# Patient Record
Sex: Male | Born: 1985 | Race: Black or African American | Hispanic: No | Marital: Single | Smoking: Current every day smoker
Health system: Southern US, Community
[De-identification: ages and names within clinical notes are randomized; demographics above are authoritative.]

## PROBLEM LIST (undated history)

## (undated) DIAGNOSIS — F319 Bipolar disorder, unspecified: Secondary | ICD-10-CM

## (undated) DIAGNOSIS — T7840XA Allergy, unspecified, initial encounter: Secondary | ICD-10-CM

## (undated) HISTORY — PX: TONSILLECTOMY: SUR1361

## (undated) HISTORY — DX: Allergy, unspecified, initial encounter: T78.40XA

---
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
## 2006-03-02 ENCOUNTER — Ambulatory Visit: Payer: Self-pay | Admitting: Family Medicine

## 2006-03-05 ENCOUNTER — Ambulatory Visit: Payer: Self-pay | Admitting: Internal Medicine

## 2006-04-14 ENCOUNTER — Ambulatory Visit: Payer: Self-pay | Admitting: Family Medicine

## 2006-05-27 ENCOUNTER — Ambulatory Visit: Payer: Self-pay | Admitting: Family Medicine

## 2007-12-19 ENCOUNTER — Emergency Department (HOSPITAL_COMMUNITY): Admission: EM | Admit: 2007-12-19 | Discharge: 2007-12-19 | Payer: Self-pay | Admitting: Family Medicine

## 2008-02-01 ENCOUNTER — Ambulatory Visit: Payer: Self-pay | Admitting: Family Medicine

## 2008-02-01 DIAGNOSIS — J45909 Unspecified asthma, uncomplicated: Secondary | ICD-10-CM | POA: Insufficient documentation

## 2008-02-01 DIAGNOSIS — J309 Allergic rhinitis, unspecified: Secondary | ICD-10-CM | POA: Insufficient documentation

## 2008-02-01 DIAGNOSIS — Z9189 Other specified personal risk factors, not elsewhere classified: Secondary | ICD-10-CM | POA: Insufficient documentation

## 2008-02-02 ENCOUNTER — Telehealth: Payer: Self-pay | Admitting: Family Medicine

## 2008-02-03 LAB — CONVERTED CEMR LAB
ALT: 31 units/L (ref 0–53)
AST: 45 units/L — ABNORMAL HIGH (ref 0–37)
Alkaline Phosphatase: 74 units/L (ref 39–117)
Basophils Relative: 0.2 % (ref 0.0–1.0)
Bilirubin, Direct: 0.3 mg/dL (ref 0.0–0.3)
CO2: 30 meq/L (ref 19–32)
Calcium: 9.1 mg/dL (ref 8.4–10.5)
Chloride: 97 meq/L (ref 96–112)
Eosinophils Relative: 5.2 % — ABNORMAL HIGH (ref 0.0–5.0)
Glucose, Bld: 84 mg/dL (ref 70–99)
Platelets: 373 10*3/uL (ref 150–400)
RBC: 5.22 M/uL (ref 4.22–5.81)
RDW: 12.3 % (ref 11.5–14.6)
Total Protein: 6.9 g/dL (ref 6.0–8.3)
WBC: 11.4 10*3/uL — ABNORMAL HIGH (ref 4.5–10.5)

## 2008-03-20 ENCOUNTER — Ambulatory Visit: Payer: Self-pay | Admitting: Internal Medicine

## 2008-03-20 DIAGNOSIS — R0989 Other specified symptoms and signs involving the circulatory and respiratory systems: Secondary | ICD-10-CM

## 2008-03-20 DIAGNOSIS — R0609 Other forms of dyspnea: Secondary | ICD-10-CM

## 2008-03-21 ENCOUNTER — Telehealth: Payer: Self-pay | Admitting: Pulmonary Disease

## 2008-03-22 ENCOUNTER — Ambulatory Visit (HOSPITAL_COMMUNITY): Admission: RE | Admit: 2008-03-22 | Discharge: 2008-03-22 | Payer: Self-pay | Admitting: Internal Medicine

## 2008-03-22 ENCOUNTER — Ambulatory Visit: Payer: Self-pay | Admitting: Cardiology

## 2008-03-22 ENCOUNTER — Encounter (INDEPENDENT_AMBULATORY_CARE_PROVIDER_SITE_OTHER): Payer: Self-pay | Admitting: Interventional Radiology

## 2008-03-22 ENCOUNTER — Ambulatory Visit: Payer: Self-pay | Admitting: Pulmonary Disease

## 2008-03-22 ENCOUNTER — Encounter: Payer: Self-pay | Admitting: Internal Medicine

## 2008-03-22 DIAGNOSIS — J90 Pleural effusion, not elsewhere classified: Secondary | ICD-10-CM | POA: Insufficient documentation

## 2008-03-23 ENCOUNTER — Telehealth (INDEPENDENT_AMBULATORY_CARE_PROVIDER_SITE_OTHER): Payer: Self-pay | Admitting: *Deleted

## 2008-03-27 ENCOUNTER — Ambulatory Visit: Payer: Self-pay | Admitting: Thoracic Surgery

## 2008-04-03 ENCOUNTER — Ambulatory Visit: Payer: Self-pay | Admitting: Thoracic Surgery

## 2008-04-03 ENCOUNTER — Encounter: Payer: Self-pay | Admitting: Thoracic Surgery

## 2008-04-03 ENCOUNTER — Encounter: Payer: Self-pay | Admitting: Internal Medicine

## 2008-04-03 ENCOUNTER — Ambulatory Visit: Payer: Self-pay | Admitting: Internal Medicine

## 2008-04-03 ENCOUNTER — Inpatient Hospital Stay (HOSPITAL_COMMUNITY): Admission: RE | Admit: 2008-04-03 | Discharge: 2008-04-10 | Payer: Self-pay | Admitting: Thoracic Surgery

## 2008-04-19 ENCOUNTER — Encounter: Admission: RE | Admit: 2008-04-19 | Discharge: 2008-04-19 | Payer: Self-pay | Admitting: Thoracic Surgery

## 2008-04-19 ENCOUNTER — Ambulatory Visit: Payer: Self-pay | Admitting: Thoracic Surgery

## 2008-05-22 ENCOUNTER — Ambulatory Visit: Payer: Self-pay | Admitting: Thoracic Surgery

## 2008-05-22 ENCOUNTER — Encounter: Admission: RE | Admit: 2008-05-22 | Discharge: 2008-05-22 | Payer: Self-pay | Admitting: Thoracic Surgery

## 2008-10-02 ENCOUNTER — Ambulatory Visit: Payer: Self-pay | Admitting: Family Medicine

## 2008-10-02 DIAGNOSIS — A158 Other respiratory tuberculosis: Secondary | ICD-10-CM | POA: Insufficient documentation

## 2008-10-02 DIAGNOSIS — J069 Acute upper respiratory infection, unspecified: Secondary | ICD-10-CM | POA: Insufficient documentation

## 2009-04-06 ENCOUNTER — Emergency Department (HOSPITAL_COMMUNITY): Admission: EM | Admit: 2009-04-06 | Discharge: 2009-04-06 | Payer: Self-pay | Admitting: Emergency Medicine

## 2010-03-14 ENCOUNTER — Telehealth: Payer: Self-pay

## 2010-05-16 ENCOUNTER — Ambulatory Visit: Payer: Self-pay | Admitting: Family Medicine

## 2010-05-16 DIAGNOSIS — R599 Enlarged lymph nodes, unspecified: Secondary | ICD-10-CM | POA: Insufficient documentation

## 2010-05-20 ENCOUNTER — Telehealth: Payer: Self-pay | Admitting: Family Medicine

## 2010-05-23 ENCOUNTER — Encounter: Payer: Self-pay | Admitting: Family Medicine

## 2010-05-23 ENCOUNTER — Telehealth: Payer: Self-pay | Admitting: Family Medicine

## 2010-05-30 ENCOUNTER — Telehealth: Payer: Self-pay | Admitting: Family Medicine

## 2010-06-06 ENCOUNTER — Telehealth: Payer: Self-pay | Admitting: Family Medicine

## 2010-10-20 ENCOUNTER — Ambulatory Visit: Payer: Self-pay | Admitting: Family Medicine

## 2010-10-20 DIAGNOSIS — L0231 Cutaneous abscess of buttock: Secondary | ICD-10-CM

## 2010-10-20 DIAGNOSIS — L03317 Cellulitis of buttock: Secondary | ICD-10-CM

## 2010-11-05 ENCOUNTER — Ambulatory Visit: Payer: Self-pay | Admitting: Family Medicine

## 2010-11-10 ENCOUNTER — Ambulatory Visit: Payer: Self-pay | Admitting: Family Medicine

## 2010-11-10 DIAGNOSIS — E119 Type 2 diabetes mellitus without complications: Secondary | ICD-10-CM

## 2010-11-10 LAB — CONVERTED CEMR LAB
ALT: 14 units/L (ref 0–53)
AST: 21 units/L (ref 0–37)
Albumin: 3.8 g/dL (ref 3.5–5.2)
Alkaline Phosphatase: 64 units/L (ref 39–117)
Basophils Relative: 0.5 % (ref 0.0–3.0)
Bilirubin Urine: NEGATIVE
Bilirubin, Direct: 0.1 mg/dL (ref 0.0–0.3)
CO2: 30 meq/L (ref 19–32)
Calcium: 9.3 mg/dL (ref 8.4–10.5)
Chloride: 103 meq/L (ref 96–112)
Eosinophils Absolute: 0.6 10*3/uL (ref 0.0–0.7)
Eosinophils Relative: 4.9 % (ref 0.0–5.0)
HDL: 37.6 mg/dL — ABNORMAL LOW (ref >39.00)
Hemoglobin, Urine: NEGATIVE
Hemoglobin: 12.4 g/dL — ABNORMAL LOW (ref 13.0–17.0)
Ketones, ur: NEGATIVE mg/dL
LDL Cholesterol: 86 mg/dL (ref 0–99)
Lymphocytes Relative: 32.5 % (ref 12.0–46.0)
MCHC: 33.7 g/dL (ref 30.0–36.0)
Neutro Abs: 6.6 10*3/uL (ref 1.4–7.7)
RBC: 4.49 M/uL (ref 4.22–5.81)
Sodium: 141 meq/L (ref 135–145)
Total CHOL/HDL Ratio: 3
Total Protein: 6.4 g/dL (ref 6.0–8.3)
Urine Glucose: NEGATIVE mg/dL
Urobilinogen, UA: 1 (ref 0.0–1.0)
WBC: 12.6 10*3/uL — ABNORMAL HIGH (ref 4.5–10.5)

## 2010-12-28 ENCOUNTER — Encounter: Payer: Self-pay | Admitting: Thoracic Surgery

## 2010-12-29 ENCOUNTER — Encounter: Payer: Self-pay | Admitting: Thoracic Surgery

## 2011-01-06 NOTE — Assessment & Plan Note (Signed)
Summary: cpx//ccm   Vital Signs:  Patient profile:   25 year old male Height:      71 inches Weight:      196 pounds BMI:     27.44 O2 Sat:      95 % Temp:     98.4 degrees F Pulse rate:   70 / minute BP sitting:   110 / 60  (left arm) Cuff size:   regular  Vitals Entered By: Pura Spice, RN (November 10, 2010 1:39 PM) CC: cpx    History of Present Illness: 25 yr old male for a cpx. He feels well and has no complaints. His recent labs showed the new onset of mild type 2 DM. He admits that he drinks a lot of sodas and eats primarily fast food every day.   Allergies: 1)  Percocet (Oxycodone-Acetaminophen) 2)  Cephalexin (Cephalexin)  Past History:  Past Medical History: Reviewed history from 10/02/2008 and no changes required. pulmonary tuberculosis, sees Dr. Edwyna Shell and Dr. Shelle Iron ALLERGIC RHINITIS (ICD-477.9) ASTHMA (ICD-493.90) as a child    Past Surgical History: Reviewed history from 02/01/2008 and no changes required. Tonsillectomy  Family History: Reviewed history from 03/22/2008 and no changes required. Family History of Asthma allergies: brother, mother, maternal grandmother  Social History: Reviewed history from 03/22/2008 and no changes required. Single Alcohol use-occasionally Drug use-former Patient states former smoker.  pt works at [employer] out.  Review of Systems  The patient denies anorexia, fever, weight loss, weight gain, vision loss, decreased hearing, hoarseness, chest pain, syncope, dyspnea on exertion, peripheral edema, prolonged cough, headaches, hemoptysis, abdominal pain, melena, hematochezia, severe indigestion/heartburn, hematuria, incontinence, genital sores, muscle weakness, suspicious skin lesions, transient blindness, difficulty walking, depression, unusual weight change, abnormal bleeding, enlarged lymph nodes, angioedema, breast masses, and testicular masses.         Flu Vaccine Consent Questions     Do you have a history of severe  allergic reactions to this vaccine? no    Any prior history of allergic reactions to egg and/or gelatin? no    Do you have a sensitivity to the preservative Thimersol? no    Do you have a past history of Guillan-Barre Syndrome? no    Do you currently have an acute febrile illness? no    Have you ever had a severe reaction to latex? no    Vaccine information given and explained to patient? yes    Are you currently pregnant? no    Lot Number:[id number]   Exp Date:06/06/2011   Site Given  Left Deltoid IM   Physical Exam  General:  Well-developed,well-nourished,in no acute distress; alert,appropriate and cooperative throughout examination Head:  Normocephalic and atraumatic without obvious abnormalities. No apparent alopecia or balding. Eyes:  No corneal or conjunctival inflammation noted. EOMI. Perrla. Funduscopic exam benign, without hemorrhages, exudates or papilledema. Vision grossly normal. Ears:  External ear exam shows no significant lesions or deformities.  Otoscopic examination reveals clear canals, tympanic membranes are intact bilaterally without bulging, retraction, inflammation or discharge. Hearing is grossly normal bilaterally. Nose:  External nasal examination shows no deformity or inflammation. Nasal mucosa are pink and moist without lesions or exudates. Mouth:  Oral mucosa and oropharynx without lesions or exudates.  Teeth in good repair. Neck:  No deformities, masses, or tenderness noted. Chest Wall:  No deformities, masses, tenderness or gynecomastia noted. Lungs:  Normal respiratory effort, chest expands symmetrically. Lungs are clear to auscultation, no crackles or wheezes. Heart:  Normal rate and regular rhythm.  S1 and S2 normal without gallop, murmur, click, rub or other extra sounds. Abdomen:  Bowel sounds positive,abdomen soft and non-tender without masses, organomegaly or hernias noted. Genitalia:  Testes bilaterally descended without nodularity, tenderness or masses.  No scrotal masses or lesions. No penis lesions or urethral discharge. Msk:  No deformity or scoliosis noted of thoracic or lumbar spine.   Pulses:  R and L carotid,radial,femoral,dorsalis pedis and posterior tibial pulses are full and equal bilaterally Extremities:  No clubbing, cyanosis, edema, or deformity noted with normal full range of motion of all joints.   Neurologic:  No cranial nerve deficits noted. Station and gait are normal. Plantar reflexes are down-going bilaterally. DTRs are symmetrical throughout. Sensory, motor and coordinative functions appear intact. Skin:  Intact without suspicious lesions or rashes Cervical Nodes:  No lymphadenopathy noted Axillary Nodes:  No palpable lymphadenopathy Inguinal Nodes:  No significant adenopathy Psych:  Cognition and judgment appear intact. Alert and cooperative with normal attention span and concentration. No apparent delusions, illusions, hallucinations   Impression & Recommendations:  Problem # 1:  DIABETES MELLITUS, TYPE II (ICD-250.00)  Other Orders: Admin 1st Vaccine ([address]) Flu Vaccine 43yrs + (60454)  Patient Instructions: 1)  We discussed dietary changes that need to be made, and he understands.  2)  Please schedule a follow-up appointment in 6 months .    Orders Added: 1)  Admin 1st Vaccine [90471] 2)  Flu Vaccine 20yrs + [90658] 3)  Est. Patient 18-39 years [99395]

## 2011-01-06 NOTE — Progress Notes (Signed)
Summary: Nausea and vomiting on Cephlaxin  Phone Note Call from Patient   Caller: Patient Call For: Dalton [name] Summary of Call: Pt here requesting off work note as he has been nauseated and vomiting on Cephalexin 500mg , one tab three times a day  X 10 days.  He hopes to RTW  today.  OV here on Friday, Throat feeling better [facility] May I do Off work note for pt Consider another antibiotic Initial call taken by: Sid Falcon LPN,  May 20, 2010 1:00 PM  Follow-up for Phone Call        Per verbal Dr Caryl Never, DC Antibiotic as pt is feeling better, throat nodule improved, F/U if symptoms return.  Pt does have F/U with Dr Clent Ridges in 2 weeks as scheduled.  Work note written Follow-up by: Sid Falcon LPN,  May 20, 2010 1:13 PM   New Allergies: CEPHALEXIN (CEPHALEXIN) New Allergies: CEPHALEXIN (CEPHALEXIN)

## 2011-01-06 NOTE — Letter (Signed)
Summary: Out of Work  Adult nurse at [employer]  [address]   [place], [place] [address]   Phone: [phone]  Fax: [phone]    May 23, 2010         Employee:  Dalton [name] [facility]    To Whom It May Concern:   For Medical reasons, please excuse the above named employee from work for the following dates:  Start:   05/16/2010  End:   05/23/2010.  [name] may return to work, regular duties on Monday, 6/20.  If you need additional information, please feel free to contact our office.         Sincerely,        Evelena Peat, MD

## 2011-01-06 NOTE — Progress Notes (Signed)
Summary: no show  Phone Note Other Incoming   Summary of Call: no show Initial call taken by: Raechel Ache, RN,  May 30, 2010 9:12 AM  Follow-up for Phone Call        charge the NS fee Follow-up by: Nelwyn Salisbury MD,  May 30, 2010 9:48 AM

## 2011-01-06 NOTE — Assessment & Plan Note (Signed)
Summary: sore throat/dm   Vital Signs:  Patient profile:   25 year old male Temp:     98.0 degrees F oral BP sitting:   120 / 70  (left arm) Cuff size:   regular  Vitals Entered By: Sid Falcon LPN (May 16, 2010 11:52 AM) CC: Left side of neck noted nodule   History of Present Illness: One week hx of L ant cervical adenopathy.  this is nonpainful.  Denies sore throat, nasal congestion, fever, chills, appetite change, weight change, rash, or any other adenopathy.  History of pulmonary tuberculosis treated 2 years ago. He denies any pleuritic pain or hemoptysis. Today history of occasional dry cough. Patient was treated for a full 9 months with rifampin and isoniazid.  does smoke less than one half packs a day.  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 Shelle Iron ALLERGIC RHINITIS (ICD-477.9) ASTHMA (ICD-493.90) as a child    Past Surgical History: Last updated: 02/01/2008 Tonsillectomy  Social History: Last updated: 03/22/2008 Single Alcohol use-occasionally Drug use-former Patient states former smoker.  pt works at [employer] out. PMH reviewed for relevance, SH/Risk Factors reviewed for relevance  Review of Systems       The patient complains of enlarged lymph nodes.  The patient denies anorexia, fever, weight loss, chest pain, syncope, dyspnea on exertion, peripheral edema, prolonged cough, headaches, hemoptysis, abdominal pain, and unusual weight change.    Physical Exam  General:  Well-developed,well-nourished,in no acute distress; alert,appropriate and cooperative throughout examination Head:  Normocephalic and atraumatic without obvious abnormalities. No apparent alopecia or balding. Ears:  External ear exam shows no significant lesions or deformities.  Otoscopic examination reveals clear canals, tympanic membranes are intact bilaterally  without bulging, retraction, inflammation or discharge. Hearing is grossly normal bilaterally. Mouth:  Oral mucosa and oropharynx without lesions or exudates.  Teeth in good repair. Neck:  No deformities, masses, or tenderness noted. Lungs:  Normal respiratory effort, chest expands symmetrically. Lungs are clear to auscultation, no crackles or wheezes. Heart:  Normal rate and regular rhythm. S1 and S2 normal without gallop, murmur, click, rub or other extra sounds. Skin:  no rashes.   Cervical Nodes:  patient has some anterior cervical adenopathy right and left side left greater than right. Couple of lymph nodes left anterior cervical triangle approximately 1 and 1/2 cm diameter. These are mobile non-fixed nontender. Smaller approximately 1/2 cm lymph node right anterior neck.  no supraclavicular adenopathy Axillary Nodes:  No palpable lymphadenopathy   Impression & Recommendations:  Problem # 1:  CERVICAL LYMPHADENOPATHY (ICD-785.6) Assessment New start cephalexin 500 mg three times a day for 10 days and follow up with primary in 2 weeks to reassess. His updated medication list for this problem includes:    Rifampin 300 Mg Caps (Rifampin) .Marland Kitchen... As directed    Isoniazid 100 Mg Tabs (Isoniazid) .Marland Kitchen... As directed    Cephalexin 500 Mg Caps (Cephalexin) ..... One by mouth three times a day for 10 days  Orders: T-2 View CXR (71020TC)  Problem # 2:  OTH SPEC PULMONARY TB-HISTO DX (ICD-011.85) repeat CXR. Orders: T-2 View CXR (71020TC)  Complete Medication List: 1)  Rifampin 300 Mg Caps (Rifampin) .... As directed 2)  Isoniazid 100 Mg Tabs (Isoniazid) .... As directed 3)  Cephalexin 500 Mg Caps (Cephalexin) .... One by mouth three times a day for 10 days  Patient Instructions: 1)  Please schedule a follow-up appointment  in 2 weeks with Dr Clent Ridges. 2)  Follow up sooner if you have fever, coughing up blood, shortness of breath, or pain with breathing. Prescriptions: CEPHALEXIN 500 MG CAPS  (CEPHALEXIN) one by mouth three times a day for 10 days  #30 x 0   Entered and Authorized by:   Evelena Peat MD   Signed by:   Evelena Peat MD on 05/16/2010   Method used:   Electronically to        [facility] [id number] (retail)       [address] [place], [place]  [address]       Ph: [phone]       Fax: [phone]   RxID:   [id number]

## 2011-01-06 NOTE — Progress Notes (Signed)
Summary: no show  Phone Note Other Incoming   Summary of Call: no show Initial call taken by: Raechel Ache, RN,  June 06, 2010 10:33 AM

## 2011-01-06 NOTE — Progress Notes (Signed)
Summary: nancy please call  Phone Note Call from Patient Call back at [phone]   Caller: Patient Call For: Nelwyn Salisbury MD Summary of Call: pt would like nancy to return his call Initial call taken by: Heron Sabins,  May 23, 2010 12:05 PM  Follow-up for Phone Call        Pt tried to go back to work today and his employer told him they have a policy that if you are out of work for illness, you need to be out for 5 days.  Pt requesting another work note to be off from 6/10 (friday) to today 6/17 (friday) and may RTW on Monday 6/20. Note written, pt will pick-up  Pt tried to explain he was much better and wanted to RTW on Thursday.  They told him no, had to be out 5 days? Follow-up by: Sid Falcon LPN,  May 23, 2010 2:42 PM

## 2011-01-06 NOTE — Letter (Signed)
Summary: Results Follow-up Letter  [place] [facility]  [address] [place], [place] [address]   Phone: [phone]  Fax: [phone]    11/10/2010  [address] [place], [place]  [address]  Dear Mr. Fornes,     Please contact our office regarding your lab results. We have tried your numbers but "no longer in service"        Sincerely,        Dr Kathie Rhodes. Clent Ridges, Md  [place] [facility] [place]

## 2011-01-06 NOTE — Progress Notes (Signed)
Summary: LL abd pain call  Phone Note Call from Patient   Caller: Patient Call For: nurse Summary of Call: spoke with pt - called in today - c/o ll abd pain . sob, fatigue, doesnt want appt today , I encouraged that with these signs - needs to be seen. Will make appt for moday later..I encouraged that if increase in pain, n/v/d or fever - must be seen in ER. Verbalized understanding. KIK Initial call taken by: Duard Brady LPN,  March 14, 2010 1:11 PM

## 2011-01-06 NOTE — Assessment & Plan Note (Signed)
Summary: LUMP IN GROIN AREA AND ON GLUTES/PAINFUL/CJR   Vital Signs:  Patient profile:   25 year old male Weight:      151 pounds O2 Sat:      96 % Temp:     98.7 degrees F Pulse rate:   61 / minute BP sitting:   104 / 60  (left arm)  Vitals Entered By: Pura Spice, RN (October 20, 2010 2:15 PM) CC: c/o knot left groin and knot rectal area   History of Present Illness: Here for 3 days of a tender lump in the left buttock. This same area has swollen up several times before, but he has never seen a doctor about it, and it usually resolves on its own in about one week. he feels fine otherwise, no fevers.   Allergies: 1)  Percocet (Oxycodone-Acetaminophen) 2)  Cephalexin (Cephalexin)  Past History:  Past Medical History: Reviewed history from 10/02/2008 and no changes required. pulmonary tuberculosis, sees Dr. Edwyna Shell and Dr. Shelle Iron ALLERGIC RHINITIS (ICD-477.9) ASTHMA (ICD-493.90) as a child    Past Surgical History: Reviewed history from 02/01/2008 and no changes required. Tonsillectomy  Review of Systems  The patient denies anorexia, fever, weight loss, weight gain, vision loss, decreased hearing, hoarseness, chest pain, syncope, dyspnea on exertion, peripheral edema, prolonged cough, headaches, hemoptysis, abdominal pain, melena, hematochezia, severe indigestion/heartburn, hematuria, incontinence, genital sores, muscle weakness, suspicious skin lesions, transient blindness, difficulty walking, depression, unusual weight change, abnormal bleeding, enlarged lymph nodes, angioedema, breast masses, and testicular masses.    Physical Exam  General:  Well-developed,well-nourished,in no acute distress; alert,appropriate and cooperative throughout examination Abdomen:  Bowel sounds positive,abdomen soft and non-tender without masses, organomegaly or hernias noted. Rectal:  No external abnormalities noted. Normal sphincter tone. No rectal masses or tenderness. There is a tender  firm lump under the skin of the left buttock about 2 cm away from the anus.  Genitalia:  Testes bilaterally descended without nodularity, tenderness or masses. No scrotal masses or lesions. No penis lesions or urethral discharge. Inguinal Nodes:  single tender enlarged node in the left groin    Impression & Recommendations:  Problem # 1:  ABSCESS, GLUTEAL (ICD-682.5)  The following medications were removed from the medication list:    Rifampin 300 Mg Caps (Rifampin) .Marland Kitchen... As directed    Isoniazid 100 Mg Tabs (Isoniazid) .Marland Kitchen... As directed    Cephalexin 500 Mg Caps (Cephalexin) ..... One by mouth three times a day for 10 days His updated medication list for this problem includes:    Doxycycline Hyclate 100 Mg Caps (Doxycycline hyclate) .Marland Kitchen..Marland Kitchen Two times a day  Complete Medication List: 1)  Doxycycline Hyclate 100 Mg Caps (Doxycycline hyclate) .... Two times a day  Patient Instructions: 1)  this is consistent with an abcessed sebaceous cyst on the buttock, and he has a reactive inguinal node related to this. treat with warm tub soaks and Doxycycline.  2)  Please schedule a follow-up appointment as needed .  Prescriptions: DOXYCYCLINE HYCLATE 100 MG CAPS (DOXYCYCLINE HYCLATE) two times a day  #28 x 0   Entered and Authorized by:   Nelwyn Salisbury MD   Signed by:   Nelwyn Salisbury MD on 10/20/2010   Method used:   Electronically to        [facility] [id number] (retail)       [address]       [place], [place]  [address]  Ph: [phone]       Fax: [phone]   RxID:   [id number]    Orders Added: 1)  Est. Patient Level IV [401[address]]

## 2011-03-17 LAB — CULTURE, ROUTINE-ABSCESS

## 2011-03-24 ENCOUNTER — Emergency Department (HOSPITAL_COMMUNITY)
Admission: EM | Admit: 2011-03-24 | Discharge: 2011-03-24 | Disposition: A | Discharge status: Home / Self Care | Payer: 59 | Attending: Emergency Medicine | Admitting: Emergency Medicine

## 2011-03-24 DIAGNOSIS — IMO0002 Reserved for concepts with insufficient information to code with codable children: Secondary | ICD-10-CM | POA: Insufficient documentation

## 2011-03-24 DIAGNOSIS — M79609 Pain in unspecified limb: Secondary | ICD-10-CM | POA: Insufficient documentation

## 2011-04-21 NOTE — H&P (Signed)
HISTORY AND PHYSICAL EXAMINATION   March 27, 2008   Re:  CYLAN, BORUM D        DOB:  Jul 02, 1986   ADDENDUM:   PHYSICAL EXAMINATION:  GENERAL:  This is a pleasant 25 year old  African/American male accompanied by his mother, who is in no acute  distress.  He is alert, oriented and cooperative.  VITAL SIGNS:  Latest vital signs reveal the blood pressure is 120/77,  pulse rate 93, respirations 18, O2 saturation 98% on room air.  HEENT:  Head normocephalic and atraumatic.  Eyes:  Extraocular muscles  intact.  Pupils equal, round, reactive to light and accommodation.  Sclerae anicteric.  NECK:  Supple, no jugular venous distention.  No obvious  lymphadenopathy.  CARDIOVASCULAR:  A regular rate and rhythm.  S1 and S2 without murmurs,  gallops or rubs.  LUNGS:  Decreased breath sounds on the left, especially the left base.  Right breath sounds are clear.  No rales, wheezes or rhonchi.  ABDOMEN:  Soft, nontender.  Bowel sounds present.  No rebound, no  guarding, no CVA tenderness.  No obvious palpable masses.  EXTREMITIES:  No clubbing, cyanosis or edema.  NEUROLOGIC:  Cranial nerves II-XII  grossly intact.  No focal deficits.   Doree Fudge, PA   DZ/MEDQ  D:  03/27/2008  T:  03/27/2008  Job:  [id number]

## 2011-04-21 NOTE — Assessment & Plan Note (Signed)
OFFICE VISIT   Dalton [name], Dalton [name]  DOB:  1986/11/26                                        May 22, 2008  CHART #:  [id number]   Dalton [name] came today.  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  He is on TB meds and feeling fine.  His blood pressure is 126/75, pulse 75, respirations 22, and his sats  were 98%.   PLAN:  We will see him back in 3 months for another chest x-ray for a  final check to see how much  pleural reaction is still there.   Ines Bloomer, M.D.  Electronically Signed   DPB/MEDQ  D:  05/22/2008  T:  05/23/2008  Job:  [id number]

## 2011-04-21 NOTE — Discharge Summary (Signed)
NAMEJEF, Dalton [name] NO.:  [id number]   MEDICAL RECORD NO.:  [id number]          PATIENT TYPE:  INP   LOCATION:  3302                         FACILITY:  [facility]   PHYSICIAN:  Coral Ceo, P.A.     DATE OF BIRTH:  1986-07-06   DATE OF ADMISSION:  04/03/2008  DATE OF DISCHARGE:  04/10/2008                               DISCHARGE SUMMARY   PRIMARY ADMITTING DIAGNOSIS:  Left pleural effusion.   ADDITIONAL/DISCHARGE DIAGNOSES:  1. Left pleural effusion.  2. Tuberculosis pleuritis.  3. History of asthma as a child.  4. History of allergic rhinitis.   PROCEDURES PERFORMED:  Left VATS, drainage of pleural effusion, and  debridement of left chest cavity with pleural biopsies.   HISTORY:  The patient is a 25 year old male who was recently referred to  Dr. Edwyna Shell for evaluation of a large left pleural effusion.  He had been  in his normal state of health until February at which time he presented  with pleuritic left chest pain, fever, shortness of breath, and was  found to have what looked like a left lower lobe pneumonia on x-ray.  He  was treated with antibiotics and did well, however, he continued to have  recurrent chest pain radiating to his left shoulder with associated  shortness of breath.  A repeat chest x-ray showed a large left pleural  effusion.  He underwent a thoracentesis and 900 mL of fluid was drained.  However, followup chest x-ray showed a persistent loculated effusion.  Cultures showed no growth.  However, there were some white cells and  lymphocytosis.  A chest x-ray was performed that showed a left lower  lobe atelectasis, a questionable rounded density in the left upper lobe,  and a 5-mm parenchymal nodule in the left upper lobe.  Dr. Edwyna Shell saw  the patient and reviewed his films and felt that he should undergo a  left VATS with drainage of the effusion and pleural biopsies as well as  possible thymic biopsy.  He explained the risks, benefits,  and  alternatives of the procedure to the patient, and he agreed to proceed  with surgery.   HOSPITAL COURSE:  Dalton [name] was admitted to [facility] on  April 03, 2008, and underwent a left VATS with drainage of pleural  effusion, pleural biopsies, and debridement of left chest cavity.  Intraoperative frozen sections were questionable for TB pleuritis.  He  tolerated the procedure well and was transferred to the floor in stable  condition.  Postoperatively, he was seen by infectious disease.  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  At the time  of discharge, his final cultures remained pending.  He was started on  INH, rifampin, pyrazinamide, and ethambutol and appears to be tolerating  them well.  He has otherwise done well.  He did have an HIV antibody  drawn which was negative.  His chest tubes have been removed in the  usual fashion and his followup x-rays remained stable.  His labs  have  been stable with his most recent labs showing a hemoglobin of 13.1,  hematocrit 39.2, white count 9.7, platelets 556, sodium 138, potassium  4.4, BUN 6, and creatinine 0.75.  He has remained afebrile and all vital  signs have been stable.  His incisions are healing well.  He is  tolerating a regular diet and is having normal bowel and bladder  function.  He has been seen by infectious disease and outpatient  followup has been arranged as well as followup with the health  department.  He is asked to continue his TB treatment for 6-9 months of  therapy, and will followup in 3 months with infectious disease.  He had  been kept in isolation while in the hospital.  He has been ambulating in  the room and tolerating a regular diet.  On Apr 10, 2008, he is evaluated  and is ready for discharge home.   DISCHARGE MEDICATIONS:  As follows, TB drug to complete a 6-9 months of  therapy.  1. Isoniazid  300 mg daily.  2. Rifampin 600 mg daily.  3. Pyrazinamide 200 mg daily.  4. Ethambutol 1600 mg daily.  5. Vitamin B6 50 mg daily.  6. Also, he will take Darvocet-N 100 one to two q.4 h p.r.n. for pain.   DISCHARGE INSTRUCTIONS:  He is asked to refrain from driving, heavy  lifting, or strenuous activity.  He may continue ambulation and  incentive spirometry.  He will continue his same preoperative diet.  He  may shower daily and clean his incisions with soap and water.   DISCHARGE FOLLOWUP:  He will see Dr. Edwyna Shell in 1 week with a chest x-  ray.  He will follow up with Dr. Sampson Goon from infectious disease in 3  months, and he will be contacted as an outpatient by the health  department for followup.      Coral Ceo, P.A.     GC/MEDQ  D:  04/10/2008  T:  04/10/2008  Job:  [id number]   cc:   Cliffton Asters, M.D.  Mick Sell, MD  [facility] Office

## 2011-04-21 NOTE — Op Note (Signed)
Dalton [name], Dalton [name]              ACCOUNT NO.:  [id number]   MEDICAL RECORD NO.:  [id number]          PATIENT TYPE:  INP   LOCATION:  3302                         FACILITY:  [facility]   PHYSICIAN:  Ines Bloomer, M.D. DATE OF BIRTH:  August 25, 1986   DATE OF PROCEDURE:  DATE OF DISCHARGE:                               OPERATIVE REPORT   PREOPERATIVE DIAGNOSIS:  Recurrent left pleural effusion.   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   OPERATION PERFORMED:  Left video-assisted thoracic surgery drainage of  pleural effusion for debridement and decortication pleura.   SURGEON:  Ines Bloomer, MD.   FIRST ASSISTANT:  Coral Ceo, PA-C.   ANESTHESIA:  General anesthesia.   This 25 year old patient was found to have a left pleural effusion.  He  had had upper respiratory infection and thoracentesis revealed  lymphocytosis.  He is referred for VATS.  After general anesthesia, he  was turned to the left lateral thoracotomy position and was prepped and  draped in the usual sterile manner.  A trocar was inserted at the  seventh intercostal space at the anterior axillary line and another one  at the posterior axillary line at the seventh intercostal space.  A 0-  degree scope was inserted.  A dual-lumen tube inserted and the lung was  deflated.  There was a marked amount of exudate in the pleura and we  removed about 800 mL of pleural fluid within the pleura and there were  multiple punctate nodules on the pleura.  We did biopsy these and showed  necrosis and then did a bigger biopsy which showed probable TB pleuritis  with necrotizing granulomas and inflammation.  The several large pieces  of pleura were stripped off.  The exudate was removed and decorticated  from the lung using Kaiser ring forceps. After this had been done, the  lung was freed up.  Two chest tubes were brought into the trocar sites  and tied in place with a 0 silk.  Marcaine block was done in the  usual  fashion.  A single On-Q was inserted in the usual fashion.  The patient  was returned to the recovery room in stable condition.      Ines Bloomer, M.D.  Electronically Signed     DPB/MEDQ  D:  04/03/2008  T:  04/03/2008  Job:  [id number]

## 2011-04-21 NOTE — Letter (Signed)
Apr 19, 2008   Barbaraann Share, MD,FCCP  [address]  [place], [place] [address]   Re:  Dalton [name], Dalton [name]              DOB:  1986-09-24   Dear Mellody Dance,   Mr. Sada came for followup today.  As you know, we thought he had a  tuberculous pleuritis.  His chest x-ray today still shows a loculated  effusion in the left lower lobe but this is improved from what it  previously was.  He said he is feeling much better, is afebrile.  Removed his chest tube sutures.  His blood pressure is 114/75, pulse 77,  respirations 18, saturation 98%.  I plan to see him back again in 3  weeks with another chest x-ray to be sure this pleural process continues  to resolve.   Ines Bloomer, M.D.  Electronically Signed   DPB/MEDQ  D:  04/19/2008  T:  04/19/2008  Job:  [id number]

## 2011-04-21 NOTE — H&P (Signed)
NAMEBRACKEN, MOFFA              ACCOUNT NO.:  [id number]   MEDICAL RECORD NO.:  [id number]           PATIENT TYPE:   LOCATION:                                 FACILITY:   PHYSICIAN:  Ines Bloomer, M.D. DATE OF BIRTH:  [birth year]   DATE OF ADMISSION:  DATE OF DISCHARGE:                              HISTORY & PHYSICAL   CHIEF COMPLAINT:  Left pleural effusion.   HISTORY OF PRESENT ILLNESS:  This 25 year old African American male who  was in the normal state of health until February and presented with left  pleuritic chest pain, fever, shortness of breath, and he was found to  have a left lower lobe treated with antibiotics and did well.  He  continued to have recurrence of his chest pain radiating to his left  shoulder and some shortness of breath.  A chest x-ray showed a large  left pleural effusion.  Thoracentesis revealed 900 mL, but a followup  chest x-ray showed that the effusion was persistently loculated.  There  were some white cells, but no growth on the culture.  His LDH was 231.  Smear showed lymphocytosis.  A CT scan done showed atelectasis of the  left lower lobe, a questionable rounded density in the left upper lobe,  and a 5-mm parenchymal nodule in the left upper lobe.  He had no fever,  chills, and no hemoptysis.   PAST MEDICAL HISTORY:  Significant for asthma as a child and allergic  rhinitis.   PAST SURGICAL HISTORY:  He has had a tonsillectomy.   ALLERGIES:  He has no allergies and no other medicines other than the  antibiotics that he had previously been on.  He is not allergic to any  medication.   FAMILY HISTORY:  There is asthma and aneurysm in the grandfather.   SOCIAL HISTORY:  Quit smoking 2 weeks ago, smoked for 2 years.  Occasional alcohol intake.  He is single.   PHYSICAL EXAMINATION:  VITAL SIGNS:  Blood pressure is 120/77, pulse 93,  respirations 18, and saturation 98%.  HEENT:  Head is atraumatic.  Eyes, pupils equally reactive to light  and  accommodation.  Extraocular movements normal.  Ears, tympanic membranes  are intact.  Nose, there is no septal deviation.  Throat shows uvula is  in the midline.  There is no lesions.  NECK:  Supple is without thyromegaly. There is no supraclavicular or  axillary adenopathy.  CHEST: Clear to auscultation and percussion.  HEART:  Regular sinus rhythm, no murmurs.  ABDOMEN:  Soft.  There is no hepatosplenomegaly.  EXTREMITIES:  Pulses are 2+.  There is no clubbing or edema.  NEUROLOGIC:  He is oriented x3.  Sensory and motor intact.  Cranial  nerves intact.   IMPRESSION:  1. Loculated effusion of left lower lobe.  2. History of pneumonia, rule out empyema, rule out pleuritis, and      rule out lymphoma.   PLAN:  Left-sided drainage of pleural effusion, pleural biopsy, and  biopsy of possible thymus.      Ines Bloomer, M.D.  Electronically Signed  DPB/MEDQ  D:  03/30/2008  T:  03/31/2008  Job:  [id number]

## 2011-04-21 NOTE — H&P (Signed)
HISTORY AND PHYSICAL EXAMINATION   March 27, 2008   Re:  AZARIAH, BONURA D        DOB:  [birth year]   CHIEF COMPLAINT:  Persistent left pleural effusion.   HISTORY OF PRESENT ILLNESS:  This is a 25 year old African/American male  who was in his normal state of health until February, when he presented  with pleuritic chest pain, fever, worsening shortness of breath and  cough (with a yellow/green sputum).  He was found to have a left lower  lobe pneumonia and was treated with antibiotics.  He had been recovering  fairly well until approximately two weeks ago, when he began  experiencing left anterior chest pain that radiated to his left upper  shoulder.  Associated with this was shortness of breath, worse upon  exertion, fever to 100 degrees, occasional chills and unable to lie  flat.  He denied any cough or sputum production, however.  A chest x-ray  was done on March 25, 2008, which showed a large left pleural effusion.  The patient underwent a thoracentesis where approximately 900 mL of  fluid was removed.  A follow-up chest x-ray showed a persistent  loculated left effusion.  Pleural fluid analysis showed moderate white  blood cells present.  No organisms and no growth after two days.  LDH  was 231, total protein 5.4, 4190 white blood cells.  Smear showed  lymphocytosis.  In addition, the patient had a CT of the chest done on  March 21, 2008, which showed atelectasis of the left lower lobe, rounded  high density, a 12 mm nodule pleural-based in the left upper lobe, a 5  mm parenchymal pulmonary nodule in the left upper lobe and a soft  density in the anterior mediastinum pre-vascular space (possible thymus  versus lymph node).  The patient was referred to Dr. Ines Bloomer  for further thoracic evaluation and management.   PAST MEDICAL HISTORY:  1. Significant for recently-diagnosed pneumonia.  2. Asthma as a child.  3. Allergic rhinitis.  Otherwise  noncontributory.   PAST SURGICAL HISTORY:  Significant for a tonsillectomy and  adenoidectomy.   ALLERGIES:  No known drug allergies.   MEDICATIONS:  Denies.   SOCIAL HISTORY:  The patient has occasional alcohol use.  Quit smoking  cigarettes about two weeks ago.  Prior to this he smoked one pack every  two days for the past six months.  His use prior to this was less.  He  had smoked for a total of about two years.   FAMILY HISTORY:  Asthma, aneurysm in maternal grandfather.   REVIEW OF SYSTEMS:  The patient denies any palpitations or chest pain.  Denies any abdominal pain, nausea or vomiting.  Significant for a  decreased appetite.  Denies any hemoptysis, melena or hematochezia.  The  remainder of the review of systems noncontributory except for those  previously stated in the HPI.   IMPRESSION/PLAN:  1. Recurrent left pleural effusion.  2. History of left lower lobe pneumonia.  This is most likely related      to a parapneumonic effusion; however, we must also rule out      lymphoma.  The patient is going to undergo a left video-assisted      thoracoscopic surgery with drainage of left pleural effusion and      pleural and thymus biopsies by Dr. Edwyna Shell early next week, and will      continue to follow closely.   Doree Fudge, PA  DZ/MEDQ  D:  03/27/2008  T:  03/27/2008  Job:  [id number]   cc:   Barbaraann Share, MD,FCCP

## 2011-06-05 ENCOUNTER — Encounter: Payer: Self-pay | Admitting: Family Medicine

## 2011-06-08 ENCOUNTER — Encounter: Payer: Self-pay | Admitting: Family Medicine

## 2011-06-08 ENCOUNTER — Ambulatory Visit (INDEPENDENT_AMBULATORY_CARE_PROVIDER_SITE_OTHER): Payer: 59 | Admitting: Family Medicine

## 2011-06-08 VITALS — BP 112/76 | Temp 98.0°F | Wt 147.0 lb

## 2011-06-08 DIAGNOSIS — D649 Anemia, unspecified: Secondary | ICD-10-CM

## 2011-06-08 DIAGNOSIS — E119 Type 2 diabetes mellitus without complications: Secondary | ICD-10-CM

## 2011-06-08 LAB — CBC WITH DIFFERENTIAL/PLATELET
Basophils Absolute: 0.1 10*3/uL (ref 0.0–0.1)
Basophils Relative: 1.1 % (ref 0.0–3.0)
Eosinophils Absolute: 0.7 10*3/uL (ref 0.0–0.7)
Hemoglobin: 14 g/dL (ref 13.0–17.0)
Lymphocytes Relative: 38.7 % (ref 12.0–46.0)
MCHC: 33.6 g/dL (ref 30.0–36.0)
Monocytes Relative: 6.4 % (ref 3.0–12.0)
Neutro Abs: 3.5 10*3/uL (ref 1.4–7.7)
Neutrophils Relative %: 45 % (ref 43.0–77.0)
RBC: 5 Mil/uL (ref 4.22–5.81)

## 2011-06-08 LAB — BASIC METABOLIC PANEL
CO2: 32 mEq/L (ref 19–32)
Calcium: 9.6 mg/dL (ref 8.4–10.5)
Chloride: 103 mEq/L (ref 96–112)
Creatinine, Ser: 0.9 mg/dL (ref 0.4–1.5)
Sodium: 140 mEq/L (ref 135–145)

## 2011-06-08 LAB — IRON: Iron: 81 ug/dL (ref 42–165)

## 2011-06-08 LAB — HEMOGLOBIN A1C: Hgb A1c MFr Bld: 5.4 % (ref 4.6–6.5)

## 2011-06-08 LAB — VITAMIN B12: Vitamin B-12: 541 pg/mL (ref 211–911)

## 2011-06-08 NOTE — Progress Notes (Signed)
  Subjective:    Patient ID: Dalton [name], male    DOB: 04-Apr-1986, 25 y.o.   MRN: [id number]  HPI Here to follow up on mild anemia and type 2 diabetes we found last November. He has cut back on the fast food he eats but otherwise no big changes. He feels fine .   Review of Systems  Constitutional: Negative.   Respiratory: Negative.   Cardiovascular: Negative.        Objective:   Physical Exam  Constitutional: He appears well-developed and well-nourished.  Cardiovascular: Normal rate, regular rhythm, normal heart sounds and intact distal pulses.   Pulmonary/Chest: Effort normal and breath sounds normal.          Assessment & Plan:  Get fasting labs today

## 2011-06-11 ENCOUNTER — Encounter: Payer: Self-pay | Admitting: Family Medicine

## 2011-09-01 LAB — COMPREHENSIVE METABOLIC PANEL
ALT: 37
AST: 42 — ABNORMAL HIGH
Albumin: 3.1 — ABNORMAL LOW
BUN: 8
CO2: 24
CO2: 27
Calcium: 9.1
Calcium: 9.5
Chloride: 99
Creatinine, Ser: 0.83
GFR calc Af Amer: >60
GFR calc non Af Amer: >60
Sodium: 136
Total Bilirubin: 0.4
Total Protein: 7.3

## 2011-09-01 LAB — URINALYSIS, ROUTINE W REFLEX MICROSCOPIC
Glucose, UA: NEGATIVE
Nitrite: NEGATIVE
Specific Gravity, Urine: 1.029
pH: 6

## 2011-09-01 LAB — BASIC METABOLIC PANEL
Chloride: 99
Creatinine, Ser: 1.01
GFR calc Af Amer: >60
Sodium: 137

## 2011-09-01 LAB — PROTEIN, BODY FLUID: Total protein, fluid: 5.4

## 2011-09-01 LAB — FUNGUS CULTURE W SMEAR
Fungal Smear: NONE SEEN
Fungal Smear: NONE SEEN

## 2011-09-01 LAB — BODY FLUID CULTURE: Culture: NO GROWTH

## 2011-09-01 LAB — LACTATE DEHYDROGENASE, PLEURAL OR PERITONEAL FLUID
LD, Fluid: 217 — ABNORMAL HIGH
LD, Fluid: 231 — ABNORMAL HIGH

## 2011-09-01 LAB — TYPE AND SCREEN
ABO/RH(D): O POS
Antibody Screen: NEGATIVE

## 2011-09-01 LAB — BLOOD GAS, ARTERIAL
Acid-Base Excess: 3 — ABNORMAL HIGH
Bicarbonate: 27.7 — ABNORMAL HIGH
Drawn by: 274481
FIO2: 0.21
O2 Saturation: 96.2
TCO2: 29.2
pCO2 arterial: 47 — ABNORMAL HIGH
pH, Arterial: 7.385
pO2, Arterial: 134 — ABNORMAL HIGH
pO2, Arterial: 84

## 2011-09-01 LAB — BODY FLUID CELL COUNT WITH DIFFERENTIAL
Eos, Fluid: 0
Lymphs, Fluid: 89
Monocyte-Macrophage-Serous Fluid: 8 — ABNORMAL LOW

## 2011-09-01 LAB — PH, BODY FLUID: pH, Fluid: 7

## 2011-09-01 LAB — AFB CULTURE WITH SMEAR (NOT AT ARMC)
Acid Fast Smear: NONE SEEN
Acid Fast Smear: NONE SEEN

## 2011-09-01 LAB — CBC
HCT: 38.3 — ABNORMAL LOW
Hemoglobin: 12.7 — ABNORMAL LOW
MCHC: 32.8
MCHC: 34
MCV: 78
MCV: 78.3
Platelets: 522 — ABNORMAL HIGH
Platelets: 534 — ABNORMAL HIGH
RBC: 4.9
RBC: 4.9
RBC: 5.29
RDW: 14.7
WBC: 11.6 — ABNORMAL HIGH
WBC: 14.7 — ABNORMAL HIGH

## 2011-09-01 LAB — PATHOLOGIST SMEAR REVIEW: Tech Review: REACTIVE

## 2011-09-01 LAB — TISSUE CULTURE
Culture: NO GROWTH
Culture: NO GROWTH
Gram Stain: NONE SEEN
Gram Stain: NONE SEEN

## 2011-09-01 LAB — APTT: aPTT: 29

## 2011-09-01 LAB — GLUCOSE, SEROUS FLUID: Glucose, Fluid: 58

## 2011-09-01 LAB — ABO/RH: ABO/RH(D): O POS

## 2011-09-07 ENCOUNTER — Ambulatory Visit: Payer: 59 | Admitting: Family Medicine

## 2011-09-08 ENCOUNTER — Ambulatory Visit: Payer: 59 | Admitting: Family Medicine

## 2011-09-08 DIAGNOSIS — Z0289 Encounter for other administrative examinations: Secondary | ICD-10-CM

## 2011-11-11 ENCOUNTER — Ambulatory Visit (INDEPENDENT_AMBULATORY_CARE_PROVIDER_SITE_OTHER): Payer: 59 | Admitting: Family Medicine

## 2011-11-11 ENCOUNTER — Encounter: Payer: Self-pay | Admitting: Family Medicine

## 2011-11-11 VITALS — BP 110/70 | HR 67 | Temp 98.3°F | Wt 149.0 lb

## 2011-11-11 DIAGNOSIS — R221 Localized swelling, mass and lump, neck: Secondary | ICD-10-CM

## 2011-11-11 DIAGNOSIS — M25569 Pain in unspecified knee: Secondary | ICD-10-CM

## 2011-11-11 NOTE — Progress Notes (Signed)
  Subjective:    Patient ID: Dalton [name], male    DOB: 1986/11/22, 25 y.o.   MRN: [id number]  HPI Here for 2 reasons. For the past 6 months he has had intermittent pains in the left knee. It does not swell. No hx of trauma. Also for 2 months he has had a fullness sensation in the throat, more so on the right side. When he swallows food it feels like it "hits something" in his throat on the way down. No ST or PND. He does smoke cigarettes.    Review of Systems  HENT: Positive for trouble swallowing. Negative for congestion, sore throat, neck pain, neck stiffness and postnasal drip.   Eyes: Negative.   Respiratory: Negative.   Musculoskeletal: Positive for arthralgias.       Objective:   Physical Exam  Constitutional: He appears well-developed and well-nourished.  HENT:  Right Ear: External ear normal.  Left Ear: External ear normal.  Nose: Nose normal.  Mouth/Throat: Oropharynx is clear and moist. No oropharyngeal exudate.  Eyes: Conjunctivae are normal.  Neck: Neck supple. No tracheal deviation present. No thyromegaly present.  Musculoskeletal:       The left knee is tender over the medial condyle, but not over the joint space. Full ROM, no edema.   Lymphadenopathy:    He has no cervical adenopathy.          Assessment & Plan:  For the tendonitis, try ice and Aleve prn. For the throat issues, refer to ENT so they can do a laryngoscopy. Once again I advised him to quit smoking.

## 2012-03-02 ENCOUNTER — Emergency Department (HOSPITAL_COMMUNITY): Admission: EM | Admit: 2012-03-02 | Discharge: 2012-03-02 | Payer: 59 | Source: Home / Self Care

## 2012-03-03 ENCOUNTER — Ambulatory Visit: Payer: 59 | Admitting: Family

## 2012-03-03 DIAGNOSIS — Z0289 Encounter for other administrative examinations: Secondary | ICD-10-CM

## 2012-03-08 ENCOUNTER — Encounter: Payer: Self-pay | Admitting: Internal Medicine

## 2012-03-08 ENCOUNTER — Ambulatory Visit (INDEPENDENT_AMBULATORY_CARE_PROVIDER_SITE_OTHER): Payer: 59 | Admitting: Internal Medicine

## 2012-03-08 VITALS — BP 120/86 | HR 84 | Temp 97.5°F | Wt 140.0 lb

## 2012-03-08 DIAGNOSIS — H01139 Eczematous dermatitis of unspecified eye, unspecified eyelid: Secondary | ICD-10-CM

## 2012-03-08 DIAGNOSIS — H029 Unspecified disorder of eyelid: Secondary | ICD-10-CM

## 2012-03-08 MED ORDER — OLOPATADINE HCL 0.1 % OP SOLN: 1.0000 [drp] | Freq: Two times a day (BID) | OPHTHALMIC | Status: DC

## 2012-03-08 NOTE — Progress Notes (Signed)
  Subjective:    Patient ID: Dalton [name], male    DOB: 1986-06-15, 26 y.o.   MRN: [id number]  HPI Patient comes in today for SDA for  new problem evaluation. About 7 days ago has some dc right eye and then both irritated   and then thought was  From allergy . Taking benadry  And zyrtec    Mostly right.    No change in vision.  Continues  And has a crust never had blister . No fevers No contacts   Review of Systems NO CP SOB cough some LBP  Remote  hs of similar and lip crust  Years ago resolved on its own.  Past history family history social history reviewed in the electronic medical record.     Objective:   Physical Exam  WDWN in nad  Irritation medial upper eyelid on right  HEENT: Normocephalic ;atraumatic , Eyes;  PERRL, EOMs  Full, looks allergic  slight darkening upper medial lid with a flat dark scab.  No vesicles or redness  and no photophobia ,,Ears: no deformities, canals nl, TM landmarks normal, Nose: no deformity or discharge minimally congested   Mouth : OP clear without lesion or edema . Neck: Supple without adenopathy or masses or bruits Chest:  Clear to A&P without wheezes rales or rhonchi CV:  S1-S2 no gallops or murmurs peripheral perfusion is normal       Assessment & Plan:  Right eye sx eyelid with itching and dc at times  Working dx is allergic  Cause  Doesn't really look like infection at this point. But disc alarm features with patient  Compresses and antiallergy drops and fu if  persistent or progressive  Call with alarm features

## 2012-03-08 NOTE — Patient Instructions (Signed)
This acts like an allergic phenomenon near  the eyelid. Use  allergy drops for now . After lukewarm compresses.  If not getting better in the next 4-6 days or getting worse call for advice. Contact us if pain vision changes or swelling .

## 2012-03-10 ENCOUNTER — Ambulatory Visit: Payer: 59 | Admitting: Family

## 2012-03-12 DIAGNOSIS — H01139 Eczematous dermatitis of unspecified eye, unspecified eyelid: Secondary | ICD-10-CM | POA: Insufficient documentation

## 2012-03-12 DIAGNOSIS — H029 Unspecified disorder of eyelid: Secondary | ICD-10-CM | POA: Insufficient documentation

## 2012-03-29 ENCOUNTER — Emergency Department (INDEPENDENT_AMBULATORY_CARE_PROVIDER_SITE_OTHER)
Admission: EM | Admit: 2012-03-29 | Discharge: 2012-03-29 | Disposition: A | Discharge status: Home / Self Care | Payer: 59 | Source: Home / Self Care | Attending: Emergency Medicine | Admitting: Emergency Medicine

## 2012-03-29 ENCOUNTER — Encounter (HOSPITAL_COMMUNITY): Payer: Self-pay | Admitting: *Deleted

## 2012-03-29 DIAGNOSIS — S39012A Strain of muscle, fascia and tendon of lower back, initial encounter: Secondary | ICD-10-CM

## 2012-03-29 DIAGNOSIS — S335XXA Sprain of ligaments of lumbar spine, initial encounter: Secondary | ICD-10-CM

## 2012-03-29 MED ORDER — TRAMADOL HCL 50 MG PO TABS: 100.0000 mg | ORAL_TABLET | Freq: Three times a day (TID) | ORAL | Status: AC | PRN

## 2012-03-29 MED ORDER — DICLOFENAC SODIUM 75 MG PO TBEC: 75.0000 mg | DELAYED_RELEASE_TABLET | Freq: Two times a day (BID) | ORAL | Status: DC

## 2012-03-29 MED ORDER — METHOCARBAMOL 500 MG PO TABS: 500.0000 mg | ORAL_TABLET | Freq: Three times a day (TID) | ORAL | Status: AC

## 2012-03-29 NOTE — ED Notes (Signed)
C/o Left low back pain that has gotten worse over about 3 week span but states yesterday picked up laundry basket and felt "pop" or sharp "pull" and pain severely increased. States could hardly stand up this morning and states now it hurts when he steps on left foot or try to stand up straight.

## 2012-03-29 NOTE — Discharge Instructions (Signed)
Back Exercises Back exercises help treat and prevent back injuries. The goal of back exercises is to increase the strength of your abdominal and back muscles and the flexibility of your back. These exercises should be started when you no longer have back pain. Back exercises include:  Pelvic Tilt. Lie on your back with your knees bent. Tilt your pelvis until the lower part of your back is against the floor. Hold this position 5 to 10 sec and repeat 5 to 10 times.   Knee to Chest. Pull first 1 knee up against your chest and hold for 20 to 30 seconds, repeat this with the other knee, and then both knees. This may be done with the other leg straight or bent, whichever feels better.   Sit-Ups or Curl-Ups. Bend your knees 90 degrees. Start with tilting your pelvis, and do a partial, slow sit-up, lifting your trunk only 30 to 45 degrees off the floor. Take at least 2 to 3 seconds for each sit-up. Do not do sit-ups with your knees out straight. If partial sit-ups are difficult, simply do the above but with only tightening your abdominal muscles and holding it as directed.   Hip-Lift. Lie on your back with your knees flexed 90 degrees. Push down with your feet and shoulders as you raise your hips a couple inches off the floor; hold for 10 seconds, repeat 5 to 10 times.   Back arches. Lie on your stomach, propping yourself up on bent elbows. Slowly press on your hands, causing an arch in your low back. Repeat 3 to 5 times. Any initial stiffness and discomfort should lessen with repetition over time.   Shoulder-Lifts. Lie face down with arms beside your body. Keep hips and torso pressed to floor as you slowly lift your head and shoulders off the floor.  Do not overdo your exercises, especially in the beginning. Exercises may cause you some mild back discomfort which lasts for a few minutes; however, if the pain is more severe, or lasts for more than 15 minutes, do not continue exercises until you see your  caregiver. Improvement with exercise therapy for back problems is slow.  See your caregivers for assistance with developing a proper back exercise program. Document Released: 12/31/2004 Document Revised: 11/12/2011 Document Reviewed: 11/23/2005 [facility] Patient Information 2012 [place], [place].  Back Pain, Adult Low back pain is very common. About 1 in 5 people have back pain.The cause of low back pain is rarely dangerous. The pain often gets better over time.About half of people with a sudden onset of back pain feel better in just 2 weeks. About 8 in 10 people feel better by 6 weeks.  CAUSES Some common causes of back pain include:  Strain of the muscles or ligaments supporting the spine.   Wear and tear (degeneration) of the spinal discs.   Arthritis.   Direct injury to the back.  DIAGNOSIS Most of the time, the direct cause of low back pain is not known.However, back pain can be treated effectively even when the exact cause of the pain is unknown.Answering your caregiver's questions about your overall health and symptoms is one of the most accurate ways to make sure the cause of your pain is not dangerous. If your caregiver needs more information, he or she may order lab work or imaging tests (X-rays or MRIs).However, even if imaging tests show changes in your back, this usually does not require surgery. HOME CARE INSTRUCTIONS For many people, back pain returns.Since low back pain  is rarely dangerous, it is often a condition that people can learn to [facility] their own.   Remain active. It is stressful on the back to sit or stand in one place. Do not sit, drive, or stand in one place for more than 30 minutes at a time. Take short walks on level surfaces as soon as pain allows.Try to increase the length of time you walk each day.   Do not stay in bed.Resting more than 1 or 2 days can delay your recovery.   Do not avoid exercise or work.Your body is made to move.It is not  dangerous to be active, even though your back may hurt.Your back will likely heal faster if you return to being active before your pain is gone.   Pay attention to your body when you bend and lift. Many people have less discomfortwhen lifting if they bend their knees, keep the load close to their bodies,and avoid twisting. Often, the most comfortable positions are those that put less stress on your recovering back.   Find a comfortable position to sleep. Use a firm mattress and lie on your side with your knees slightly bent. If you lie on your back, put a pillow under your knees.   Only take over-the-counter or prescription medicines as directed by your caregiver. Over-the-counter medicines to reduce pain and inflammation are often the most helpful.Your caregiver may prescribe muscle relaxant drugs.These medicines help dull your pain so you can more quickly return to your normal activities and healthy exercise.   Put ice on the injured area.   Put ice in a plastic bag.   Place a towel between your skin and the bag.   Leave the ice on for 15 to 20 minutes, 3 to 4 times a day for the first 2 to 3 days. After that, ice and heat may be alternated to reduce pain and spasms.   Ask your caregiver about trying back exercises and gentle massage. This may be of some benefit.   Avoid feeling anxious or stressed.Stress increases muscle tension and can worsen back pain.It is important to recognize when you are anxious or stressed and learn ways to manage it.Exercise is a great option.  SEEK MEDICAL CARE IF:  You have pain that is not relieved with rest or medicine.   You have pain that does not improve in 1 week.   You have new symptoms.   You are generally not feeling well.  SEEK IMMEDIATE MEDICAL CARE IF:   You have pain that radiates from your back into your legs.   You develop new bowel or bladder control problems.   You have unusual weakness or numbness in your arms or legs.    You develop nausea or vomiting.   You develop abdominal pain.   You feel faint.  Document Released: 11/23/2005 Document Revised: 08/05/2011 Document Reviewed: 04/13/2011 [facility] Patient Information 2012 [place], [place].

## 2012-03-29 NOTE — ED Provider Notes (Signed)
Chief Complaint  Patient presents with  . Back Pain    History of Present Illness:   The patient is a 26 year old male whose history I have week history of lower back pain on the left without radiation. The pain is worse when he walks, stands, or moves, and better if he sits down and leans forward. He denies any injury. Yesterday he bent over to pick up a laundry basket and felt a pop and a pull in his lower back and say the pain was worse. He had problem with any kind of movement even getting up out of bed. He noticed that whenever he walks he has sharp pain in the left lower back but it didn't radiate down into the leg. He denies any numbness, tingling, weakness in the leg, bladder, or bowel complaints. He's had no fever, chills, sweats, anorexia, weight loss. No abdominal pain.  Review of Systems:  Other than noted above, the patient denies any of the following symptoms: Systemic:  No fever, chills, fatigue, or weight loss. GI:  No abdominal pain, nausea, vomiting, diarrhea, constipation or blood in stool. GU:  No dysuria, frequency, urgency, or hematuria. No incontinence or difficulty urinating.  M-S:  No neck pain, joint pain, arthritis, or myalgias. Neuro:  No parethesias or muscular weakness. Skin:  No rash or itching.   PMFSH:  Past medical history, family history, social history, meds, and allergies were reviewed.  Physical Exam:   Vital signs:  BP 110/71  Pulse 63  Temp(Src) 98.6 F (37 C) (Oral)  Resp 16  SpO2 100% General:  Alert, oriented, in no distress. Abdomen:  Soft, non-tender.  No organomegaly or mass.  No pulsatile midline abdominal mass or bruit. Back:  Back muscles are very tight. There is not much tenderness to palpation, but he has just a few degrees of motion in all directions with pain and muscle spasm in straight leg raising was positive bilaterally although without radiation down the legs and interesting enough, straight leg raising on the right produced pain in  the left the left produced pain in the right. The same side and popliteal compression signs were negative. Neuro:  He has a little bit of weakness of his ankle dorsiflexors on the left. Otherwise strength is normal and sensation was normal. UA 500 on the left and 1+ on the right, ankle reflexes are 1+ bilaterally.  Skin:  Clear, warm and dry.  No rash.  Assessment:  The encounter diagnosis was Lumbar strain.  Plan:   1.  The following meds were prescribed:   New Prescriptions   DICLOFENAC (VOLTAREN) 75 MG EC TABLET    Take 1 tablet (75 mg total) by mouth 2 (two) times daily.   METHOCARBAMOL (ROBAXIN) 500 MG TABLET    Take 1 tablet (500 mg total) by mouth 3 (three) times daily.   TRAMADOL (ULTRAM) 50 MG TABLET    Take 2 tablets (100 mg total) by mouth every 8 (eight) hours as needed for pain.   2.  The patient was instructed in symptomatic care and handouts were given. 3.  The patient was told to return if becoming worse in any way, if no better in 3 or 4 days, and given some red flag symptoms that would indicate earlier return. He was referred to progress with physical therapy for this we'll therapy 3 times weekly for 4 weeks. He was given some back exercises to do at home as well. Suggested moist heat. Return again in a month if  no better.  Reuben Likes, MD 03/29/12 1901

## 2012-07-27 ENCOUNTER — Ambulatory Visit (INDEPENDENT_AMBULATORY_CARE_PROVIDER_SITE_OTHER): Payer: 59 | Admitting: Family Medicine

## 2012-07-27 ENCOUNTER — Encounter: Payer: Self-pay | Admitting: Family Medicine

## 2012-07-27 VITALS — BP 110/68 | HR 83 | Temp 98.5°F | Wt 142.0 lb

## 2012-07-27 DIAGNOSIS — L0292 Furuncle, unspecified: Secondary | ICD-10-CM

## 2012-07-27 MED ORDER — DOXYCYCLINE HYCLATE 100 MG PO CAPS: 100.0000 mg | ORAL_CAPSULE | Freq: Two times a day (BID) | ORAL | Status: AC

## 2012-07-27 NOTE — Progress Notes (Signed)
  Subjective:    Patient ID: Dalton [name], male    DOB: 11-01-1986, 26 y.o.   MRN: [id number]  HPI Here for one week of a tender boil on the left buttock which has been slowly enlarging. Then 2 days ago he also developed a smaller boil on the lower abdomen and a tender lump in te left groin. No fever.    Review of Systems  Constitutional: Negative.        Objective:   Physical Exam  Constitutional: He appears well-developed and well-nourished.  Abdominal:       Small tender cystic lesion above the pubis. Single tender left inguinal node   Skin:       Large firm tender mass beneath the skin on the left buttock           Assessment & Plan:  He has several abscessed cysts with regional reactive nodes. Cover for possible MRSA with Doxycycline for 14 days. Use hot soaks and Advil prn. Recheck in one week

## 2012-07-28 ENCOUNTER — Telehealth: Payer: Self-pay | Admitting: Family Medicine

## 2012-07-28 NOTE — Telephone Encounter (Signed)
Caller: Raynold/Patient; Patient Name: Dalton [name]; PCP: Nelwyn Salisbury.; Best Callback Phone Number: [phone]; Reason for call: Other.  Pt was into see Dr Clent Ridges on 07/27/12 for boil on left side of bottom.  Pt was prescribed antibiotics and Ibuprofen.  Pt states the area has gotten larger and he cannot use his left leg without pain.  Pt rates his pain as 10/10. Pt denies any fever or drainage from the wound. All emergent symptoms of Skin Lesions Protocol ruled out with exception to 'Painful lesion unrelieved with home care measures'. Home care advice given, RX of Ultram 50 mg take 1-2 tablets PO Q 4-6 hours PRN pain Dispense # 6 no refills called into [facility] [phone].  RN left message on machine.  RN also made appt for 07/29/12 at 1:45 with Dr Clent Ridges.

## 2012-07-29 ENCOUNTER — Ambulatory Visit: Payer: 59 | Admitting: Family Medicine

## 2012-07-31 ENCOUNTER — Encounter (HOSPITAL_COMMUNITY): Payer: Self-pay | Admitting: Emergency Medicine

## 2012-07-31 ENCOUNTER — Emergency Department (INDEPENDENT_AMBULATORY_CARE_PROVIDER_SITE_OTHER)
Admission: EM | Admit: 2012-07-31 | Discharge: 2012-07-31 | Disposition: A | Discharge status: Home / Self Care | Payer: 59 | Source: Home / Self Care | Attending: Emergency Medicine | Admitting: Emergency Medicine

## 2012-07-31 DIAGNOSIS — L0291 Cutaneous abscess, unspecified: Secondary | ICD-10-CM

## 2012-07-31 MED ORDER — OXYCODONE-ACETAMINOPHEN 5-325 MG PO TABS: ORAL_TABLET | ORAL | Status: DC

## 2012-07-31 NOTE — ED Notes (Signed)
Reports "boil on butt" onset one week ago.  Currently on antibiotics, seen and treated by dr Abran Cantor

## 2012-07-31 NOTE — ED Provider Notes (Signed)
Chief Complaint  Patient presents with  . 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 He saw his primary care physician, Dr. Abran Cantor, this past Thursday, 4 days ago and was placed on doxycycline, but it is getting worse. He has had boils before but none in this particular place. There's been no history of diabetes or MRSA, although he has been prediabetic. He denies any fever, chills, or drainage from the abscess.  Review of Systems:  Other than noted above, the patient denies any of the following symptoms: Systemic:  No fever, chills or sweats. Skin:  No rash or itching.  PMFSH:  Past medical history, family history, social history, meds, and allergies were reviewed.  No history of diabetes or prior history of abscesses or MRSA.  Physical Exam:   Vital signs:  BP 117/78  Pulse 99  Temp 98.5 F (36.9 C) (Oral)  Resp 16  SpO2 99% Skin:  There is a 6 x 6 cm ovoid shaped abscess on the left buttock inferiorly. This was fluctuant and very tender to touch.  Skin exam was otherwise normal.  No rash. Ext:  Distal pulses were full, patient has full ROM of all joints.    Procedure:  Verbal informed consent was obtained.  The patient was informed of the risks and benefits of the procedure and understands and accepts.  Identity of the patient was verified verbally and by wristband.   The abscess area described above was prepped with Betadine and alcohol and anesthetized with 10 mL of 2% Xylocaine with epinephrine.  Using a #11 scalpel blade, a singe straight incision was made into the area of fluctulence, yielding a large amount of prurulent drainage.  Routine cultures were obtained.  Blunt dissection was used to break up loculations and the resulting wound cavity was packed with 1/4 inch Iodoform gauze.  A sterile pressure dressing was applied.  Assessment:  The encounter diagnosis was Abscess.  Plan:    1.  The following meds were prescribed:   New Prescriptions   OXYCODONE-ACETAMINOPHEN (PERCOCET) 5-325 MG PER TABLET    1 to 2 tablets every 6 hours as needed for pain.   2.  The patient was instructed in symptomatic care and handouts were given. 3.  The patient was instructed to leave the dressing in place and return again in 48 hours for packing removal. Since this was such a large abscess, I think it will need to be repacked. He was instructed to continue on with his doxycycline. A culture was obtained.   Reuben Likes, MD 07/31/12 2006

## 2012-08-02 ENCOUNTER — Emergency Department (HOSPITAL_COMMUNITY)
Admission: EM | Admit: 2012-08-02 | Discharge: 2012-08-02 | Discharge status: Absent without leave | Payer: 59 | Source: Home / Self Care

## 2012-08-03 ENCOUNTER — Encounter (HOSPITAL_COMMUNITY): Payer: Self-pay | Admitting: *Deleted

## 2012-08-03 ENCOUNTER — Emergency Department (HOSPITAL_COMMUNITY)
Admission: EM | Admit: 2012-08-03 | Discharge: 2012-08-03 | Disposition: A | Discharge status: Home / Self Care | Payer: 59 | Source: Home / Self Care | Attending: Family Medicine | Admitting: Family Medicine

## 2012-08-03 DIAGNOSIS — L0231 Cutaneous abscess of buttock: Secondary | ICD-10-CM

## 2012-08-03 MED ORDER — OXYCODONE-ACETAMINOPHEN 5-325 MG PO TABS
1.0000 | ORAL_TABLET | Freq: Three times a day (TID) | ORAL | Status: DC | PRN
Start: 2012-08-03 — End: 2014-10-13

## 2012-08-03 MED ORDER — IBUPROFEN 600 MG PO TABS: 600.0000 mg | ORAL_TABLET | Freq: Three times a day (TID) | ORAL | Status: AC | PRN

## 2012-08-03 MED ORDER — METRONIDAZOLE 500 MG PO TABS: 500.0000 mg | ORAL_TABLET | Freq: Two times a day (BID) | ORAL | Status: AC

## 2012-08-03 NOTE — ED Notes (Signed)
Here  For  recheck  Of  abcess  Pt  Seen      3  Days  Ago          Here  Today  For  Packing  Removal        He  Reports  The  Area  Is  Still  painfull        He  Is  Taking  His  Doxy  As  Prescribed

## 2012-08-03 NOTE — ED Provider Notes (Signed)
History     CSN: [id number]  Arrival date & time 08/03/12  1131   First MD Initiated Contact with Patient 08/03/12 1147      Chief Complaint  Patient presents with  . Wound Check    (Consider location/radiation/quality/duration/timing/severity/associated sxs/prior treatment) HPI Comments: 26 year old smoker male. Patient had incision and drainage of left gluteal abscess on August 25th. Here for packing removal. Reports persistent tenderness. Decreased drainage. Taking doxycycline as previously prescribed. No fever or chills. No general malaise. Appetite stable. Denies pain with defecation. There was observation of gram-positive cocci in pairs and gram-negative rods on Gram stain with no growth on abscess culture day #1.   Past Medical History  Diagnosis Date  . Allergy   . Asthma   . Pulmonary tuberculosis     Past Surgical History  Procedure Date  . Tonsillectomy     No family history on file.  History  Substance Use Topics  . Smoking status: Current Everyday Smoker -- 0.5 packs/day    Types: Cigarettes  . Smokeless tobacco: Never Used   Comment: 4 ciggs qd  . Alcohol Use: No      Review of Systems  Constitutional: Negative for fever, chills and appetite change.       10 systems reviewed and  pertinent negative and positive symptoms are as per HPI.     Gastrointestinal: Negative for nausea.  Skin: Positive for wound.       As per HPi  Neurological: Negative for dizziness and headaches.  All other systems reviewed and are negative.    Allergies  Demerol and Cephalexin  Home Medications   Current Outpatient Rx  Name Route Sig Dispense Refill  . DOXYCYCLINE HYCLATE 100 MG PO CAPS Oral Take 1 capsule (100 mg total) by mouth 2 (two) times daily. 28 capsule 0  . IBUPROFEN 600 MG PO TABS Oral Take 1 tablet (600 mg total) by mouth every 8 (eight) hours as needed for pain. Take as instructed with food 20 tablet 0  . METRONIDAZOLE 500 MG PO TABS Oral Take 1  tablet (500 mg total) by mouth 2 (two) times daily. 14 tablet 0  . OXYCODONE-ACETAMINOPHEN 5-325 MG PO TABS Oral Take 1 tablet by mouth every 8 (eight) hours as needed for pain. 10 tablet 0    BP 114/74  Pulse 77  Temp 98.1 F (36.7 C) (Oral)  Resp 14  SpO2 98%  Physical Exam  Nursing note and vitals reviewed. Constitutional: He is oriented to person, place, and time. He appears well-developed and well-nourished. No distress.  Cardiovascular: Normal heart sounds.   Pulmonary/Chest: Breath sounds normal.  Genitourinary:       Declined rectal exam.  Neurological: He is alert and oriented to person, place, and time.  Skin:       Left gluteal area close to mid ischial area, with a packed wound status post I&D. Packing removed, minimal red blood drainage, no purulent. No significant erythema. Tender induration around wound. No fluctuations.     ED Course  Procedures (including critical care time)  Labs Reviewed - No data to display No results found.   1. Abscess, gluteal, left       MDM  No purulent drainage on exam. Patient declined rectal examination. Added metronidazole. Patient requested refill on narcotic prescription, refilled Percocet 15 pills added ibuprofen 600 mg. Wound care instructions discussed with patient. Asked to return in 48-hour for followup or return earlier to the emergency department if persistent pain intensity of  or worsening or new symptoms like fever chills or recurrent purulent drainage.        Sharin Grave, MD 08/05/12 [phone]

## 2012-08-04 LAB — CULTURE, ROUTINE-ABSCESS

## 2012-08-04 NOTE — ED Notes (Signed)
Abscess culture L buttocks: Peptostreptococcus species.  Pt. adequately treated with Doxycycline on 8/21 by Dr. Clent Ridges and Flagyl 8/28 by Dr. Tressia Danas. Pt. also had I and D done 8/25. Vassie Moselle 08/04/2012

## 2013-01-06 ENCOUNTER — Emergency Department (INDEPENDENT_AMBULATORY_CARE_PROVIDER_SITE_OTHER)
Admission: EM | Admit: 2013-01-06 | Discharge: 2013-01-06 | Disposition: A | Discharge status: Home / Self Care | Payer: 59 | Source: Home / Self Care | Attending: Family Medicine | Admitting: Family Medicine

## 2013-01-06 ENCOUNTER — Encounter (HOSPITAL_COMMUNITY): Payer: Self-pay | Admitting: Emergency Medicine

## 2013-01-06 ENCOUNTER — Emergency Department (INDEPENDENT_AMBULATORY_CARE_PROVIDER_SITE_OTHER): Payer: 59

## 2013-01-06 DIAGNOSIS — S93409A Sprain of unspecified ligament of unspecified ankle, initial encounter: Secondary | ICD-10-CM

## 2013-01-06 DIAGNOSIS — S93401A Sprain of unspecified ligament of right ankle, initial encounter: Secondary | ICD-10-CM

## 2013-01-06 DIAGNOSIS — S335XXA Sprain of ligaments of lumbar spine, initial encounter: Secondary | ICD-10-CM

## 2013-01-06 MED ORDER — CYCLOBENZAPRINE HCL 5 MG PO TABS: 5.0000 mg | ORAL_TABLET | Freq: Three times a day (TID) | ORAL | Status: DC | PRN

## 2013-01-06 MED ORDER — DICLOFENAC POTASSIUM 50 MG PO TABS: 50.0000 mg | ORAL_TABLET | Freq: Three times a day (TID) | ORAL | Status: DC

## 2013-01-06 NOTE — ED Notes (Signed)
Pt is here for right foot and lower back inj. Slipped on ice and onto sidewalk around 0530 yest am Sx include: swelling, pain, increases w/activity, limping Denies: head inj/LOC Took ibuprofen last night for the discomfort  Ambulated well to exam room and got onto table w/no difficulty He is alert w/no signs of acute distress

## 2013-01-06 NOTE — ED Provider Notes (Signed)
History     CSN: [id number]  Arrival date & time 01/06/13  1040   First MD Initiated Contact with Patient 01/06/13 1044      Chief Complaint  Patient presents with  . Foot Injury    (Consider location/radiation/quality/duration/timing/severity/associated sxs/prior treatment) Patient is a 27 y.o. male presenting with ankle pain. The history is provided by the patient.  Ankle Pain  The incident occurred yesterday. The incident occurred at home. The injury mechanism was a fall (slipped on black ice going to work yest and twisted right ankle and tiwsted low back.). The pain is present in the right ankle. The quality of the pain is described as sharp. The pain is mild. He reports no foreign bodies present. The symptoms are aggravated by bearing weight.    Past Medical History  Diagnosis Date  . Allergy   . Asthma   . Pulmonary tuberculosis     Past Surgical History  Procedure Date  . Tonsillectomy     No family history on file.  History  Substance Use Topics  . Smoking status: Current Every Day Smoker -- 0.5 packs/day    Types: Cigarettes  . Smokeless tobacco: Never Used     Comment: 4 ciggs qd  . Alcohol Use: No      Review of Systems  Constitutional: Negative.   Gastrointestinal: Negative.   Genitourinary: Negative.   Musculoskeletal: Positive for back pain and joint swelling.    Allergies  Demerol and Cephalexin  Home Medications   Current Outpatient Rx  Name  Route  Sig  Dispense  Refill  . OXYCODONE-ACETAMINOPHEN 5-325 MG PO TABS   Oral   Take 1 tablet by mouth every 8 (eight) hours as needed for pain.   10 tablet   0     There were no vitals taken for this visit.  Physical Exam  Nursing note and vitals reviewed. Constitutional: He is oriented to person, place, and time. He appears well-developed and well-nourished.  Abdominal: Soft. Bowel sounds are normal.  Musculoskeletal: He exhibits tenderness.       Right ankle: He exhibits normal range  of motion, no swelling, no ecchymosis and no deformity. tenderness. Lateral malleolus tenderness found. No medial malleolus, no head of 5th metatarsal and no proximal fibula tenderness found. Achilles tendon normal.       Lumbar back: He exhibits spasm.  Neurological: He is alert and oriented to person, place, and time.  Skin: Skin is warm and dry.    ED Course  Procedures (including critical care time)  Labs Reviewed - No data to display No results found.   No diagnosis found.    MDM  X-rays reviewed and report per radiologist.         Linna Hoff, MD 01/06/13 [phone]

## 2013-01-24 ENCOUNTER — Encounter: Payer: 59 | Admitting: Family Medicine

## 2013-01-24 DIAGNOSIS — Z0289 Encounter for other administrative examinations: Secondary | ICD-10-CM

## 2013-01-31 ENCOUNTER — Ambulatory Visit (INDEPENDENT_AMBULATORY_CARE_PROVIDER_SITE_OTHER)
Admission: RE | Admit: 2013-01-31 | Discharge: 2013-01-31 | Disposition: A | Discharge status: Home / Self Care | Payer: 59 | Source: Ambulatory Visit | Attending: Family Medicine | Admitting: Family Medicine

## 2013-01-31 ENCOUNTER — Ambulatory Visit (INDEPENDENT_AMBULATORY_CARE_PROVIDER_SITE_OTHER): Payer: 59 | Admitting: Family Medicine

## 2013-01-31 ENCOUNTER — Encounter: Payer: Self-pay | Admitting: Family Medicine

## 2013-01-31 VITALS — BP 116/70 | HR 92 | Temp 98.3°F | Wt 135.0 lb

## 2013-01-31 DIAGNOSIS — M546 Pain in thoracic spine: Secondary | ICD-10-CM

## 2013-01-31 NOTE — Progress Notes (Signed)
  Subjective:    Patient ID: Dalton [name], male    DOB: Aug 10, 1986, 27 y.o.   MRN: [id number]  HPI Here for 2 months of intermittent sharp pain in the middle back. No radiation to the arms or legs. No hx of trauma. It has been steadily getting worse. He saw Urgent Care last week and was given Percocet, Flexeril, and Diclofenac. He says he cannot work while taking Flexeril because it makes him sleepy. The Diclofenac does not help as much as the Percocet, so he asks me for more Percocet.    Review of Systems  Constitutional: Negative.   Musculoskeletal: Positive for back pain.       Objective:   Physical Exam  Constitutional: He appears well-developed and well-nourished. No distress.  Musculoskeletal:  There appears to be some mild scoliosis to the thoracic spine. He is tender over the lower thoracic spine with spasm present, more prominent over the right paraspinal muscles. ROM is full.           Assessment & Plan:  I declined to give him any more Percocet, and I advised that the best thing for him to use was Diclofenac and Flexeril. Get Xrays of the spine today. We may consider sending him to PT.

## 2013-02-01 NOTE — Progress Notes (Signed)
Quick Note:  I left voice message with results. ______ 

## 2013-02-03 ENCOUNTER — Ambulatory Visit (INDEPENDENT_AMBULATORY_CARE_PROVIDER_SITE_OTHER): Payer: 59 | Admitting: Family Medicine

## 2013-02-03 ENCOUNTER — Encounter: Payer: Self-pay | Admitting: Family Medicine

## 2013-02-03 VITALS — BP 112/70 | HR 94 | Temp 98.2°F | Ht 70.5 in | Wt 138.0 lb

## 2013-02-03 DIAGNOSIS — Z0000 Encounter for general adult medical examination without abnormal findings: Secondary | ICD-10-CM

## 2013-02-03 DIAGNOSIS — M546 Pain in thoracic spine: Secondary | ICD-10-CM

## 2013-02-03 MED ORDER — ALBUTEROL SULFATE HFA 108 (90 BASE) MCG/ACT IN AERS: 2.0000 | INHALATION_SPRAY | RESPIRATORY_TRACT | Status: DC | PRN

## 2013-02-03 NOTE — Progress Notes (Signed)
  Subjective:    Patient ID: Dalton [name], male    DOB: 29-Dec-1985, 27 y.o.   MRN: [id number]  HPI 27 yr old male for a cpx. He has a few issues to discuss. We saw him recently for back pain and Xrays revealed some scoliosis. He is interested in getting some PT for this. Also he has had some spells of SOB at work lately. No coughing or chest pain. It reminds him of when he has asthma as a teenager.    Review of Systems  Constitutional: Negative.   HENT: Negative.   Eyes: Negative.   Respiratory: Positive for shortness of breath. Negative for apnea, cough, choking, chest tightness, wheezing and stridor.   Cardiovascular: Negative.   Gastrointestinal: Negative.   Genitourinary: Negative.   Musculoskeletal: Negative.   Skin: Negative.   Neurological: Negative.   Psychiatric/Behavioral: Negative.        Objective:   Physical Exam  Constitutional: He is oriented to person, place, and time. He appears well-developed and well-nourished. No distress.  HENT:  Head: Normocephalic and atraumatic.  Right Ear: External ear normal.  Left Ear: External ear normal.  Nose: Nose normal.  Mouth/Throat: Oropharynx is clear and moist. No oropharyngeal exudate.  Eyes: Conjunctivae and EOM are normal. Pupils are equal, round, and reactive to light. Right eye exhibits no discharge. Left eye exhibits no discharge. No scleral icterus.  Neck: Neck supple. No JVD present. No tracheal deviation present. No thyromegaly present.  Cardiovascular: Normal rate, regular rhythm, normal heart sounds and intact distal pulses.  Exam reveals no gallop and no friction rub.   No murmur heard. Pulmonary/Chest: Effort normal and breath sounds normal. No respiratory distress. He has no wheezes. He has no rales. He exhibits no tenderness.  Abdominal: Soft. Bowel sounds are normal. He exhibits no distension and no mass. There is no tenderness. There is no rebound and no guarding.  Genitourinary: Rectum normal, prostate  normal and penis normal. Guaiac negative stool. No penile tenderness.  Musculoskeletal: Normal range of motion. He exhibits no edema and no tenderness.  Lymphadenopathy:    He has no cervical adenopathy.  Neurological: He is alert and oriented to person, place, and time. He has normal reflexes. No cranial nerve deficit. He exhibits normal muscle tone. Coordination normal.  Skin: Skin is warm and dry. No rash noted. He is not diaphoretic. No erythema. No pallor.  Psychiatric: He has a normal mood and affect. His behavior is normal. Judgment and thought content normal.          Assessment & Plan:  Well exam. Get fasting labs soon. Refer to PT for the back pain. His asthma seems to be flaring up again so we will give him a Proventil inhaler to use.

## 2013-02-08 ENCOUNTER — Other Ambulatory Visit: Payer: 59

## 2013-02-13 ENCOUNTER — Ambulatory Visit: Payer: 59

## 2013-02-13 ENCOUNTER — Ambulatory Visit: Payer: 59 | Admitting: Physical Therapy

## 2013-02-17 ENCOUNTER — Other Ambulatory Visit: Payer: 59

## 2013-02-23 ENCOUNTER — Ambulatory Visit: Payer: 59

## 2014-10-13 ENCOUNTER — Inpatient Hospital Stay (HOSPITAL_COMMUNITY)
Admission: AD | Admit: 2014-10-13 | Discharge: 2014-10-19 | DRG: 897 | Disposition: A | Discharge status: Home / Self Care | Payer: 59 | Source: Intra-hospital | Attending: Psychiatry | Admitting: Psychiatry

## 2014-10-13 ENCOUNTER — Encounter (HOSPITAL_COMMUNITY): Payer: Self-pay | Admitting: Emergency Medicine

## 2014-10-13 ENCOUNTER — Emergency Department (HOSPITAL_COMMUNITY): Payer: 59

## 2014-10-13 ENCOUNTER — Encounter (HOSPITAL_COMMUNITY): Payer: Self-pay | Admitting: *Deleted

## 2014-10-13 ENCOUNTER — Emergency Department (HOSPITAL_COMMUNITY)
Admission: EM | Admit: 2014-10-13 | Discharge: 2014-10-13 | Disposition: A | Discharge status: Other Acute Inpatient Hospital | Payer: 59 | Attending: Emergency Medicine | Admitting: Emergency Medicine

## 2014-10-13 DIAGNOSIS — F17210 Nicotine dependence, cigarettes, uncomplicated: Secondary | ICD-10-CM | POA: Diagnosis present

## 2014-10-13 DIAGNOSIS — Z79899 Other long term (current) drug therapy: Secondary | ICD-10-CM | POA: Diagnosis not present

## 2014-10-13 DIAGNOSIS — Z720 Tobacco use: Secondary | ICD-10-CM | POA: Insufficient documentation

## 2014-10-13 DIAGNOSIS — J45909 Unspecified asthma, uncomplicated: Secondary | ICD-10-CM | POA: Insufficient documentation

## 2014-10-13 DIAGNOSIS — R45851 Suicidal ideations: Secondary | ICD-10-CM | POA: Diagnosis present

## 2014-10-13 DIAGNOSIS — F329 Major depressive disorder, single episode, unspecified: Secondary | ICD-10-CM | POA: Insufficient documentation

## 2014-10-13 DIAGNOSIS — F1910 Other psychoactive substance abuse, uncomplicated: Secondary | ICD-10-CM

## 2014-10-13 DIAGNOSIS — F1110 Opioid abuse, uncomplicated: Secondary | ICD-10-CM | POA: Diagnosis not present

## 2014-10-13 DIAGNOSIS — Z791 Long term (current) use of non-steroidal anti-inflammatories (NSAID): Secondary | ICD-10-CM | POA: Insufficient documentation

## 2014-10-13 DIAGNOSIS — F32A Depression, unspecified: Secondary | ICD-10-CM

## 2014-10-13 DIAGNOSIS — F1123 Opioid dependence with withdrawal: Principal | ICD-10-CM | POA: Diagnosis present

## 2014-10-13 DIAGNOSIS — A150 Tuberculosis of lung: Secondary | ICD-10-CM | POA: Diagnosis not present

## 2014-10-13 DIAGNOSIS — F10129 Alcohol abuse with intoxication, unspecified: Secondary | ICD-10-CM | POA: Insufficient documentation

## 2014-10-13 DIAGNOSIS — F1120 Opioid dependence, uncomplicated: Secondary | ICD-10-CM | POA: Diagnosis present

## 2014-10-13 LAB — CBC
HEMATOCRIT: 41.8 % (ref 39.0–52.0)
Hemoglobin: 14.8 g/dL (ref 13.0–17.0)
MCH: 27.4 pg (ref 26.0–34.0)
MCHC: 35.4 g/dL (ref 30.0–36.0)
MCV: 77.3 fL — AB (ref 78.0–100.0)
Platelets: 334 10*3/uL (ref 150–400)
RBC: 5.41 MIL/uL (ref 4.22–5.81)
RDW: 13.6 % (ref 11.5–15.5)
WBC: 10 10*3/uL (ref 4.0–10.5)

## 2014-10-13 LAB — COMPREHENSIVE METABOLIC PANEL
ALT: 24 U/L (ref 0–53)
ANION GAP: 12 (ref 5–15)
AST: 23 U/L (ref 0–37)
Albumin: 4.2 g/dL (ref 3.5–5.2)
Alkaline Phosphatase: 59 U/L (ref 39–117)
BUN: 12 mg/dL (ref 6–23)
CO2: 28 meq/L (ref 19–32)
CREATININE: 0.96 mg/dL (ref 0.50–1.35)
Calcium: 10.2 mg/dL (ref 8.4–10.5)
Chloride: 100 mEq/L (ref 96–112)
GFR calc Af Amer: >90 mL/min (ref >90)
GLUCOSE: 82 mg/dL (ref 70–99)
Potassium: 4.9 mEq/L (ref 3.7–5.3)
Sodium: 140 mEq/L (ref 137–147)
Total Bilirubin: 0.3 mg/dL (ref 0.3–1.2)
Total Protein: 7.3 g/dL (ref 6.0–8.3)

## 2014-10-13 LAB — ACETAMINOPHEN LEVEL: Acetaminophen (Tylenol), Serum: <15 ug/mL (ref 10–30)

## 2014-10-13 LAB — ETHANOL: ALCOHOL ETHYL (B): 24 mg/dL — AB (ref 0–11)

## 2014-10-13 LAB — RAPID URINE DRUG SCREEN, HOSP PERFORMED
Amphetamines: NOT DETECTED
BARBITURATES: NOT DETECTED
BENZODIAZEPINES: NOT DETECTED
Cocaine: NOT DETECTED
Opiates: NOT DETECTED
Tetrahydrocannabinol: NOT DETECTED

## 2014-10-13 LAB — SALICYLATE LEVEL: Salicylate Lvl: <2 mg/dL — ABNORMAL LOW (ref 2.8–20.0)

## 2014-10-13 MED ORDER — LOPERAMIDE HCL 2 MG PO CAPS: 2.0000 mg | ORAL_CAPSULE | ORAL | Status: AC | PRN

## 2014-10-13 MED ORDER — CLONIDINE HCL 0.1 MG PO TABS
0.1000 mg | ORAL_TABLET | Freq: Every day | ORAL | Status: AC
  Administered 2014-10-18 – 2014-10-19 (×2): 0.1 mg via ORAL
  Filled 2014-10-13 (×2): qty 1

## 2014-10-13 MED ORDER — ALUM & MAG HYDROXIDE-SIMETH 200-200-20 MG/5ML PO SUSP: 30.0000 mL | ORAL | Status: DC | PRN

## 2014-10-13 MED ORDER — ZOLPIDEM TARTRATE 5 MG PO TABS: 5.0000 mg | ORAL_TABLET | Freq: Every evening | ORAL | Status: DC | PRN

## 2014-10-13 MED ORDER — CLONIDINE HCL 0.1 MG PO TABS
0.1000 mg | ORAL_TABLET | Freq: Four times a day (QID) | ORAL | Status: AC
  Administered 2014-10-14 – 2014-10-15 (×5): 0.1 mg via ORAL
  Filled 2014-10-13 (×12): qty 1

## 2014-10-13 MED ORDER — METHOCARBAMOL 500 MG PO TABS
500.0000 mg | ORAL_TABLET | Freq: Three times a day (TID) | ORAL | Status: AC | PRN
  Administered 2014-10-14 – 2014-10-18 (×9): 500 mg via ORAL
  Filled 2014-10-13 (×9): qty 1

## 2014-10-13 MED ORDER — DICYCLOMINE HCL 20 MG PO TABS
20.0000 mg | ORAL_TABLET | Freq: Four times a day (QID) | ORAL | Status: AC | PRN
  Administered 2014-10-18: 20 mg via ORAL
  Filled 2014-10-13: qty 1

## 2014-10-13 MED ORDER — ZOLPIDEM TARTRATE 5 MG PO TABS
5.0000 mg | ORAL_TABLET | Freq: Every evening | ORAL | Status: DC | PRN
  Administered 2014-10-14: 5 mg via ORAL
  Filled 2014-10-13: qty 1

## 2014-10-13 MED ORDER — HYDROXYZINE HCL 25 MG PO TABS: 25.0000 mg | ORAL_TABLET | Freq: Four times a day (QID) | ORAL | Status: DC | PRN

## 2014-10-13 MED ORDER — ONDANSETRON 4 MG PO TBDP: 4.0000 mg | ORAL_TABLET | Freq: Four times a day (QID) | ORAL | Status: AC | PRN

## 2014-10-13 MED ORDER — ONDANSETRON 4 MG PO TBDP
4.0000 mg | ORAL_TABLET | Freq: Four times a day (QID) | ORAL | Status: DC | PRN
Start: 2014-10-13 — End: 2014-10-13

## 2014-10-13 MED ORDER — ONDANSETRON HCL 4 MG PO TABS: 4.0000 mg | ORAL_TABLET | Freq: Three times a day (TID) | ORAL | Status: DC | PRN

## 2014-10-13 MED ORDER — NAPROXEN 250 MG PO TABS: 500.0000 mg | ORAL_TABLET | Freq: Two times a day (BID) | ORAL | Status: DC | PRN

## 2014-10-13 MED ORDER — CLONIDINE HCL 0.1 MG PO TABS: 0.1000 mg | ORAL_TABLET | Freq: Every day | ORAL | Status: DC

## 2014-10-13 MED ORDER — IBUPROFEN 600 MG PO TABS
600.0000 mg | ORAL_TABLET | Freq: Three times a day (TID) | ORAL | Status: DC | PRN
Start: 2014-10-13 — End: 2014-10-19
  Administered 2014-10-17 – 2014-10-19 (×3): 600 mg via ORAL
  Filled 2014-10-13 (×4): qty 1

## 2014-10-13 MED ORDER — CLONIDINE HCL 0.1 MG PO TABS: 0.1000 mg | ORAL_TABLET | ORAL | Status: DC

## 2014-10-13 MED ORDER — CLONIDINE HCL 0.1 MG PO TABS
0.1000 mg | ORAL_TABLET | ORAL | Status: AC
  Administered 2014-10-16 – 2014-10-17 (×3): 0.1 mg via ORAL
  Filled 2014-10-13 (×4): qty 1

## 2014-10-13 MED ORDER — IBUPROFEN 200 MG PO TABS: 600.0000 mg | ORAL_TABLET | Freq: Three times a day (TID) | ORAL | Status: DC | PRN

## 2014-10-13 MED ORDER — LOPERAMIDE HCL 2 MG PO CAPS
2.0000 mg | ORAL_CAPSULE | ORAL | Status: DC | PRN
Start: 2014-10-13 — End: 2014-10-13

## 2014-10-13 MED ORDER — HYDROXYZINE HCL 25 MG PO TABS
25.0000 mg | ORAL_TABLET | Freq: Four times a day (QID) | ORAL | Status: AC | PRN
  Administered 2014-10-15 – 2014-10-18 (×8): 25 mg via ORAL
  Filled 2014-10-13 (×8): qty 1

## 2014-10-13 MED ORDER — CLONIDINE HCL 0.1 MG PO TABS
0.1000 mg | ORAL_TABLET | Freq: Four times a day (QID) | ORAL | Status: DC
  Administered 2014-10-13: 0.1 mg via ORAL
  Filled 2014-10-13: qty 1

## 2014-10-13 MED ORDER — DICYCLOMINE HCL 20 MG PO TABS: 20.0000 mg | ORAL_TABLET | Freq: Four times a day (QID) | ORAL | Status: DC | PRN

## 2014-10-13 MED ORDER — METHOCARBAMOL 500 MG PO TABS: 500.0000 mg | ORAL_TABLET | Freq: Three times a day (TID) | ORAL | Status: DC | PRN

## 2014-10-13 NOTE — ED Notes (Signed)
Pt requesting all of his belongings to be given to his family.

## 2014-10-13 NOTE — ED Notes (Signed)
Pt back from Xray at this time

## 2014-10-13 NOTE — ED Notes (Signed)
[facility] [employer]ed, informed transportation is on the way

## 2014-10-13 NOTE — ED Notes (Signed)
Pt en route to X-ray at this time.

## 2014-10-13 NOTE — BH Assessment (Signed)
Tele Assessment Note   Dalton [name] is an 28 y.o. male who presents to [facility] due to opiate abuse. Patient reported using 70-100 mg of opiates daily for about 5 years with last use on 10/11/14. Patient also reported ongoing alcohol use with 1-2 beers daily for 1 year with last use 10/13/14. Patient reported experiencing symptoms of depression such as sleep difficulty, loss of appetite, isolation, fatigue and feelings of guilt. Patient reported depression is related to his drug use and the effects it has on his life such as financial and conflict in relationships. Patient is also concerned about the effects his drug use has on him physically. Patient stated he is currently seeking treatment due to his mother recently finding out about the extent of his drug use. Patient stated "I'm here to get help."   Patient Ox4. Patient reported depressed mood with congruent effect. Patient denies SI, HI and AVH. Patient denies any history of SA or MH treatment in the past.  Patient reported his supports are his girlfriend whom he resides with and his mother.   Axis I: Opioid Use Disorder and Mood Disorder NOS  Past Medical History:  Past Medical History  Diagnosis Date  . Allergy   . Asthma   . Pulmonary tuberculosis     Past Surgical History  Procedure Laterality Date  . Tonsillectomy      Family History: History reviewed. No pertinent family history.  Social History:  reports that he has been smoking Cigarettes.  He has been smoking about 0.50 packs per day. He has never used smokeless tobacco. He reports that he does not drink alcohol or use illicit drugs.  Additional Social History:  Alcohol / Drug Use Pain Medications: none reported Prescriptions: none reported Over the Counter: none reported History of alcohol / drug use?: Yes Longest period of sobriety (when/how long): unknown Negative Consequences of Use: Financial, Personal relationships Withdrawal Symptoms: Fever / Chills, Tremors,  Nausea / Vomiting Substance #1 Name of Substance 1: Percocet, Oxycodone (Opioid) 1 - Amount (size/oz): 70-100 mg  1 - Frequency: daily 1 - Duration: 5 years 1 - Last Use / Amount: 10/11/14 Substance #2 Name of Substance 2: Alcohol 2 - Amount (size/oz): 1-2 beers 2 - Frequency: daily  2 - Duration: 1 year 2 - Last Use / Amount: 10/13/14  CIWA: CIWA-Ar BP: 132/76 mmHg Pulse Rate: 78 COWS: Clinical Opiate Withdrawal Scale (COWS) Resting Pulse Rate: Pulse Rate 80 or below Sweating: No report of chills or flushing Restlessness: Reports difficulty sitting still, but is able to do so Pupil Size: Pupils pinned or normal size for room light Bone or Joint Aches: Not present Runny Nose or Tearing: Not present GI Upset: No GI symptoms Tremor: Tremor can be felt, but not observed Yawning: No yawning Anxiety or Irritability: Patient reports increasing irritability or anxiousness Gooseflesh Skin: Skin is smooth COWS Total Score: 3  PATIENT STRENGTHS: (choose at least two) Average or above average intelligence Motivation for treatment/growth Supportive family/friends Work skills  Allergies:  Allergies  Allergen Reactions  . Cephalexin     REACTION: nausea, vomiting    Home Medications:  (Not in a hospital admission)  OB/GYN Status:  No LMP for male patient.  General Assessment Data Location of Assessment: [facility] ED Is this a Tele or Face-to-Face Assessment?: Tele Assessment Is this an Initial Assessment or a Re-assessment for this encounter?: Initial Assessment Living Arrangements: Spouse/significant other Can pt return to current living arrangement?: Yes Admission Status: Voluntary Is patient capable  of signing voluntary admission?: Yes Transfer from: Acute Hospital Referral Source: Self/Family/Friend  Medical Screening Exam [facility] Walk-in ONLY) Medical Exam completed:  (NA)  [facility] Living Arrangements: Spouse/significant other Name of Psychiatrist:   (None) Name of Therapist:  (None)     Risk to self with the past 6 months Suicidal Ideation: No-Not Currently/Within Last 6 Months Suicidal Intent: No-Not Currently/Within Last 6 Months Is patient at risk for suicide?: No Suicidal Plan?: No Access to Means: No What has been your use of drugs/alcohol within the last 12 months?:  (opiates, alcohol) Previous Attempts/Gestures: Yes How many times?: 1 Other Self Harm Risks:  (none reported) Triggers for Past Attempts: Other (Comment) (Ongoing drug use) Intentional Self Injurious Behavior: None Family Suicide History: Unknown Recent stressful life event(s): Other (Comment), Financial Problems (Ongoing drug addiction) Persecutory voices/beliefs?: No Depression: Yes Depression Symptoms: Despondent, Insomnia, Fatigue, Isolating, Guilt, Feeling worthless/self pity, Feeling angry/irritable Substance abuse history and/or treatment for substance abuse?: Yes Suicide prevention information given to non-admitted patients: Not applicable  Risk to Others within the past 6 months Homicidal Ideation: No Thoughts of Harm to Others: No Current Homicidal Intent: No Current Homicidal Plan: No Access to Homicidal Means: No Identified Victim:  (NA) History of harm to others?: No Assessment of Violence: None Noted Violent Behavior Description: Patient calm and cooperative at assessment.  Does patient have access to weapons?: Yes (Comment) (Patient stated none in the home but could access if needed.) Criminal Charges Pending?: Yes Describe Pending Criminal Charges: Larceny and failure to appear Does patient have a court date: Yes Court Date:  (11/18, 11/23)  Psychosis Hallucinations: None noted Delusions: None noted  Mental Status Report Appear/Hygiene: Unremarkable Eye Contact: Poor Motor Activity: Unremarkable Speech: Logical/coherent Level of Consciousness: Alert Mood: Depressed Affect: Depressed Anxiety Level: None Thought Processes:  Coherent, Relevant Judgement: Unimpaired Orientation: Person, Place, Time, Situation Obsessive Compulsive Thoughts/Behaviors: None  Cognitive Functioning Concentration: Normal Memory: Recent Intact, Remote Intact IQ: Average Insight: Fair Impulse Control: Fair Appetite: Poor Weight Loss:  (unk) Weight Gain:  (unk) Sleep: Decreased Total Hours of Sleep: 3 Vegetative Symptoms: None  ADLScreening [facility] Assessment Services) Patient's cognitive ability adequate to safely complete daily activities?: Yes Patient able to express need for assistance with ADLs?: Yes Independently performs ADLs?: Yes (appropriate for developmental age)  Prior Inpatient Therapy Prior Inpatient Therapy: No Prior Therapy Dates:  (NA) Prior Therapy Facilty/Provider(s):  (NA) Reason for Treatment:  (NA)  Prior Outpatient Therapy Prior Outpatient Therapy: No Prior Therapy Dates:  (NA) Prior Therapy Facilty/Provider(s):  (NA) Reason for Treatment:  (NA)  ADL Screening (condition at time of admission) Patient's cognitive ability adequate to safely complete daily activities?: Yes Is the patient deaf or have difficulty hearing?: No Does the patient have difficulty seeing, even when wearing glasses/contacts?: No Does the patient have difficulty concentrating, remembering, or making decisions?: No Patient able to express need for assistance with ADLs?: Yes Does the patient have difficulty dressing or bathing?: No Independently performs ADLs?: Yes (appropriate for developmental age)       Abuse/Neglect Assessment (Assessment to be complete while patient is alone) Physical Abuse: Denies Verbal Abuse: Denies Sexual Abuse: Denies Exploitation of patient/patient's resources: Denies Self-Neglect: Denies     Merchant navy officerAdvance Directives (For Healthcare) Does patient have an advance directive?: No    Additional Information 1:1 In Past 12 Months?: No CIRT Risk: No Elopement Risk: No Does patient have medical  clearance?: Yes    Disposition: Clinician consulted with Nanine MeansJamison Lord, NP who  states Pt meets criteria for inpatient admission. Shalita, AC confirmed bed availability. Pt assigned to Observation bed #1   to Dr. Jama Flavorsobos.  Disposition Initial Assessment Completed for this Encounter: Yes Disposition of Patient: Other dispositions Other disposition(s): Other (Comment) ([facility]  Hessie DibbleROBERTS, Oyindamola Key R 10/13/2014 5:19 PM

## 2014-10-13 NOTE — ED Notes (Addendum)
[facility] [employer] arrived at this time.

## 2014-10-13 NOTE — ED Notes (Signed)
Pt leaving with [facility] at this time.

## 2014-10-13 NOTE — Progress Notes (Signed)
Patient ID: Joylene GrapesCorey D Leas, male   DOB: 02/27/1986, 28 y.o.   MRN: [id number]   [facility]:   Patient admitted to Observation Unit  Requesting Opiate detox. Pt states he has been having thoughts of SI passively, but denies at this time. Patient verbally contracts for safety. Patient does, however endorse depression with a dull, flat affect. Pt states he became addicted to pain pills five years ago after having his wisdom teeth out. Pt states he does not feel like the same person. He feels that the "pills have taken over my life". Pt states he has been minimizing the amount he takes to his family. Patient currently lives with his girlfriend, who he says is supportive. Pt works, but states he only averages 2-3 hours of sleep per night and feels run down. Pt is seeking inpatient rehab for his addiction. No s/s of distress noted at this time. Pt cooperative and pleasant with staff. Pt provided with a meal and soda.

## 2014-10-13 NOTE — ED Notes (Signed)
All patient's belongings returned to patients mother. Patient agreed to let mother take belongings home with her. Patient signed inventory log, placed this form in d/c inventory binder.

## 2014-10-13 NOTE — ED Notes (Signed)
Accepted to Observation Unit Bed 1.

## 2014-10-13 NOTE — Progress Notes (Signed)
[facility] INPATIENT:  Family/Significant Other Suicide Prevention Education  Suicide Prevention Education:  Patient Refusal for Family/Significant Other Suicide Prevention Education: The patient Dalton [name] has refused to provide written consent for family/significant other to be provided Family/Significant Other Suicide Prevention Education during admission and/or prior to discharge.  Physician notified.  Renaee MundaSadler, Gearld Kerstein Thomas 10/13/2014, 10:34 PM

## 2014-10-13 NOTE — ED Provider Notes (Addendum)
CSN: [id number]     Arrival date & time 10/13/14  1308 History   First MD Initiated Contact with Patient 10/13/14 1357     Chief Complaint  Patient presents with  . Depression  . Drug Problem  . Medical Clearance     (Consider location/radiation/quality/duration/timing/severity/associated sxs/prior Treatment) HPI   28 year old male presents ER requesting for help with substance abuse and depression.patient states for the past 4-5 years he has been abusing opiates and alcohol. Patient states he takes medication which he got off the street including Vicodin Percocet, Ultram, molly, marijuana, ecstasy.  He also admits to drinking alcohol on a regular basis. At the urging of his family member patient decided to seek for help today because he felt that he has gotten out of control with his polysubstance abuse. Patient feels depressed, sleeping more than usual, and also having several episodes of suicidal ideation without a specific plan. He denies homicidal ideation or hallucination. His last drug use was 2 days ago, last alcohol use was today, including a 16 ounce alcohol. He is an occasional smoker. At this time he denies having severe headache, neck pain, chest pain, shortness of breath, abdominal pain, numbness or weakness. He is not actively suicidal.patient is currently working at an [employer] store.    Past Medical History  Diagnosis Date  . Allergy   . Asthma   . Pulmonary tuberculosis    Past Surgical History  Procedure Laterality Date  . Tonsillectomy     History reviewed. No pertinent family history. History  Substance Use Topics  . Smoking status: Current Every Day Smoker -- 0.50 packs/day    Types: Cigarettes  . Smokeless tobacco: Never Used     Comment: 4 ciggs qd  . Alcohol Use: No    Review of Systems  All other systems reviewed and are negative.     Allergies  Cephalexin  Home Medications   Prior to Admission medications   Medication Sig Start Date End  Date Taking? Authorizing Provider  albuterol (PROVENTIL HFA;VENTOLIN HFA) 108 (90 BASE) MCG/ACT inhaler Inhale 2 puffs into the lungs every 4 (four) hours as needed for wheezing or shortness of breath. 02/03/13   Nelwyn SalisburyStephen A Fry, MD  cyclobenzaprine (FLEXERIL) 5 MG tablet Take 1 tablet (5 mg total) by mouth 3 (three) times daily as needed for muscle spasms. 01/06/13   Linna HoffJames D Kindl, MD  diclofenac (CATAFLAM) 50 MG tablet Take 1 tablet (50 mg total) by mouth 3 (three) times daily. 01/06/13   Linna HoffJames D Kindl, MD  oxyCODONE-acetaminophen (PERCOCET) 5-325 MG per tablet Take 1 tablet by mouth every 8 (eight) hours as needed for pain. 08/03/12   Adlih Moreno-Coll, MD   BP 132/76 mmHg  Pulse 78  Temp(Src) 98.1 F (36.7 C)  Resp 16  Ht 5\' 11"  (1.803 m)  Wt 140 lb (63.504 kg)  BMI 19.53 kg/m2  SpO2 100% Physical Exam  Constitutional: He is oriented to person, place, and time. He appears well-developed and well-nourished. No distress.  HENT:  Head: Atraumatic.  Eyes: Conjunctivae are normal.  Neck: Normal range of motion. Neck supple.  Cardiovascular: Normal rate and regular rhythm.   Pulmonary/Chest: Effort normal and breath sounds normal. No respiratory distress.  Abdominal: Soft. There is no tenderness.  Neurological: He is alert and oriented to person, place, and time.  Skin: No rash noted.  Psychiatric: His speech is normal and behavior is normal. Judgment normal. His affect is blunt. Thought content is not paranoid. Cognition and memory  are normal. He expresses no homicidal and no suicidal ideation.    ED Course  Procedures (including critical care time)  2:41 PM Patient is here requesting for opiate detox and help with his depression. He is not actively homicidal or suicidal. He is likely not qualify for inpatient admission which I discussed this with patient. He however felt that if he was to go home he will relapse. At this time he has no evidence of withdrawal. We'll consult TTS for further  management once patient is medically cleared.  3:03 PM Pt is medically cleared.  Will consult TTS for further management. Pt likely would not qualify for admission but he would benefit from talking to TTS to establish outpt management.    4:33 PM Prior to discharge pt report he is now suicidal but did not specifically indicate plan.   Labs Review Labs Reviewed  CBC - Abnormal; Notable for the following:    MCV 77.3 (*)    All other components within normal limits  ETHANOL - Abnormal; Notable for the following:    Alcohol, Ethyl (B) 24 (*)    All other components within normal limits  SALICYLATE LEVEL - Abnormal; Notable for the following:    Salicylate Lvl <2.0 (*)    All other components within normal limits  ACETAMINOPHEN LEVEL  COMPREHENSIVE METABOLIC PANEL  URINE RAPID DRUG SCREEN (HOSP PERFORMED)    Imaging Review No results found.   EKG Interpretation None      MDM   Final diagnoses:  Polysubstance abuse  Depression    BP 132/76 mmHg  Pulse 78  Temp(Src) 98.1 F (36.7 C)  Resp 16  Ht 5\' 11"  (1.803 m)  Wt 140 lb (63.504 kg)  BMI 19.53 kg/m2  SpO2 100%     Fayrene HelperBowie Broderick Fonseca, PA-C 10/13/14 1544  Mirian MoMatthew Gentry, MD 10/13/14 1600  Fayrene HelperBowie Justise Ehmann, PA-C 10/13/14 1634  Mirian MoMatthew Gentry, MD 10/14/14 [phone]

## 2014-10-13 NOTE — ED Notes (Addendum)
Pt updated on plan of care. Currently waiting for CXR.

## 2014-10-13 NOTE — Plan of Care (Signed)
[facility] Observation Crisis Plan  Reason for Crisis Plan:  Substance Abuse   Plan of Care:  Referral for Inpatient Hospitalization and Referral for Substance Abuse  Family Support:    yes  Current Living Environment:   with girlfriend  Insurance:   Hospital Account    Name Acct ID Class Status Primary Coverage   Dalton [name], Dalton [name] [id number] BEHAVIORAL HEALTH OBSERVATION Open None        Guarantor Account (for Hospital Account [id number])    Name Relation to Pt Service Area Active? Acct Type   Dalton [name] Self [facility] Yes Behavioral Health   Address Phone       [address] [place], [place] [id number] [phone])          Coverage Information (for Hospital Account [id number])    Not on file      Legal Guardian:   N/A  Primary Care Provider:  Nelwyn SalisburyFRY,STEPHEN A, [name]  Current Outpatient Providers:  none  Psychiatrist:   N/A  Counselor/Therapist:   N/A  Compliant with Medications:  Yes  Additional Information:   Dalton [name], Dalton [name] 11/7/201510:33 PM

## 2014-10-13 NOTE — ED Notes (Signed)
Dinner ordered for patient.

## 2014-10-13 NOTE — BH Assessment (Signed)
Clinician contacted Trula OreChristina to arrange set up of tele- assessment. Assessment to be initiated.  Nira Retortelilah Toniyah Dilmore, MSW, LCSW Triage Specialist [phone]

## 2014-10-13 NOTE — ED Notes (Signed)
Requested a sitter, pt in scrubs and security at triage to wand pt.

## 2014-10-13 NOTE — BH Assessment (Signed)
Clinician consulted with Nanine MeansJamison Lord, NP who states Pt meets criteria for inpatient admission. Shalita, AC confirmed bed availability. [facility]  to Dr. Jama Flavorsobos.  Clinician provided updates to Marylene LandAngela, RN.  Yaakov Guthrieelilah Stewart, MSW, LCSW Triage Specialist [phone]

## 2014-10-13 NOTE — ED Notes (Signed)
Pt eating food.

## 2014-10-13 NOTE — BH Assessment (Signed)
Clinician attempted tele-assessment. [facility]. Attempting to set up other machine.   Nira Retortelilah Bijou Easler, MSW, LCSW Triage Specialist [phone]

## 2014-10-13 NOTE — ED Notes (Signed)
Pt reports needing help with substance abuse, mainly takes pain pills and drinks etoh. Last took pills Thursday and drank etoh this am. Reports occ thoughts of suicide and has depression. Calm and cooperative at triage.

## 2014-10-13 NOTE — ED Notes (Signed)
[facility]  CXR to be performed prior to transfer.  [facility]

## 2014-10-13 NOTE — ED Notes (Signed)
RN informed BH that patient's X-ray was normal, stated patient could be sent over at this time.

## 2014-10-14 DIAGNOSIS — F17210 Nicotine dependence, cigarettes, uncomplicated: Secondary | ICD-10-CM | POA: Diagnosis present

## 2014-10-14 DIAGNOSIS — F321 Major depressive disorder, single episode, moderate: Secondary | ICD-10-CM

## 2014-10-14 DIAGNOSIS — F1123 Opioid dependence with withdrawal: Secondary | ICD-10-CM | POA: Diagnosis present

## 2014-10-14 DIAGNOSIS — F329 Major depressive disorder, single episode, unspecified: Secondary | ICD-10-CM | POA: Diagnosis present

## 2014-10-14 DIAGNOSIS — F1120 Opioid dependence, uncomplicated: Secondary | ICD-10-CM

## 2014-10-14 DIAGNOSIS — F1910 Other psychoactive substance abuse, uncomplicated: Secondary | ICD-10-CM | POA: Diagnosis present

## 2014-10-14 DIAGNOSIS — R45851 Suicidal ideations: Secondary | ICD-10-CM | POA: Diagnosis present

## 2014-10-14 DIAGNOSIS — F1019 Alcohol abuse with unspecified alcohol-induced disorder: Secondary | ICD-10-CM

## 2014-10-14 DIAGNOSIS — F1994 Other psychoactive substance use, unspecified with psychoactive substance-induced mood disorder: Secondary | ICD-10-CM

## 2014-10-14 MED ORDER — NICOTINE 21 MG/24HR TD PT24
MEDICATED_PATCH | TRANSDERMAL | Status: AC
  Administered 2014-10-14: 21 mg via TRANSDERMAL
  Filled 2014-10-14: qty 1

## 2014-10-14 MED ORDER — NICOTINE POLACRILEX 2 MG MT GUM
2.0000 mg | CHEWING_GUM | OROMUCOSAL | Status: DC | PRN
  Administered 2014-10-16 – 2014-10-17 (×2): 2 mg via ORAL
  Filled 2014-10-14 (×2): qty 1

## 2014-10-14 MED ORDER — NICOTINE 21 MG/24HR TD PT24
21.0000 mg | MEDICATED_PATCH | Freq: Every day | TRANSDERMAL | Status: DC
  Administered 2014-10-14: 21 mg via TRANSDERMAL
  Filled 2014-10-14 (×3): qty 1

## 2014-10-14 NOTE — Progress Notes (Signed)
Patient ID: Dalton [name], male   DOB: 29-Jun-1986, 28 y.o.   MRN: [id number]  28 year old male presents to the unit from the Observation unit. [facility] complaints of depression and addiction to oxycodone/roxycodone. Pt also states that he uses other substances. Pt was pleasant with a flat affect and depressed mood. Pt was searched and oriented to the unit and the rules. The unit orientation packet was completed and signed by the patient and Clinical research associatewriter. Pt was given opportunity to ask questions and express concerns. Verbal understanding from patient. Pt denies SI/HI and A/VH and verbally contracts for safety.  Aurora Maskwyman, Ariyah Sedlack E, RN

## 2014-10-14 NOTE — Progress Notes (Signed)
Patient ID: Dalton [name], male   DOB: 1985/12/30, 28 y.o.   MRN: [id number] Shift note: d: RN did a 1:1 with this pt in a private area. He was very guarded and not forth coming about why he is in this facility. He has no had any complaint of pain and is taking his medications as ordered. His am dose of clonidine was held due to low b/p.Marland Kitchen. He has showered and eaten his meals. He is here for polysubstance abuse. When ask what is his plan to stay off of substances after discharge,  he became quiet and did not response. He did say that his significant other is supportive. RN suggested he developed a strategy to stay away for drugs. He denied any si/hiav. He has been cooperative and polite. RN will continue to monitor and he remains in the obs area.

## 2014-10-14 NOTE — H&P (Signed)
Observation unit Psychiatric Admission  H/P  Patient Identification:  Dalton [name] Date of Evaluation:  10/14/2014 Chief Complaint:  OPIATE ABUSE History of Present Illness:: 28 year old AA male  Was admitted to our observation unit for  help with substance abuse and depression.  Patient states that since age 66 years he has been abusing Opiates and alcohol.  Patient states he takes medication which he got off the street including Vicodin Percocet, Ultram, molly, marijuana, ecstasy. He also admits to drinking alcohol on a regular basis.  Patient states that his Opiate use started after his dental procedure when his wisdom tooth was taken out.  He states that  his family members are now encouraging him to seek treatment. patient decided to seek treatment because he is now realizing that he is wasting money buying these drugs, risking his life and his mood is beginning to be erratic.  Patient believes that he is depressed from using all the drugs he has been using.  He denies SI/HI/AVH but stated that he has been thinking of killing himself by driving his car into a tree.  He also reported an attempt to kill self by OD on pills but ended up sleeping for 18 hours.  He did not report to anybody. Patient feels depressed, sleeping more than usual, and also having several episodes of suicidal ideation without a specific plan.  His last alcohol and Opiate use was yesterday.  Patient reports his withdrawal symptoms as body cramps, sweating, irritability, anxiety and nausea.  We have accepted him for admission and started his detox with Clonidine protocol.  We will be admitting patient to any facility with available bed.  Elements:  Location:  Alcoho dependence, Opoioid dependence, mood disorder. Quality:  irritabiolity, anxiety, nausea, hypersombnia, suicidal thoughts off and on. Severity:  severe. Timing:  acute. Duration:  Chronic since 5 years. Context:  Want to stop using addictive drugs and drinking  alcohol.. Associated Signs/Synptoms: Depression Symptoms:  depressed mood, hypersomnia, fatigue, feelings of worthlessness/guilt, hopelessness, recurrent thoughts of death, suicidal thoughts with specific plan, anxiety, (Hypo) Manic Symptoms:   Anxiety Symptoms:   Psychotic Symptoms:   PTSD Symptoms:  Total Time spent with patient: 1 hour  Psychiatric Specialty Exam: Physical Exam  ROS  Blood pressure 110/63, pulse 64, temperature 97.7 F (36.5 C), temperature source Oral, resp. rate 16, height 5' 11"  (1.803 m), weight 62.143 kg (137 lb), SpO2 99 %.Body mass index is 19.12 kg/(m^2).  General Appearance: Casual  Eye Contact::  Good  Speech:  Clear and Coherent  Volume:  Normal  Mood:  Anxious, Depressed, Hopeless and Irritable  Affect:  Congruent, Depressed and Flat  Thought Process:  Coherent, Goal Directed and Intact  Orientation:  Full (Time, Place, and Person)  Thought Content:  WDL  Suicidal Thoughts:  No  Homicidal Thoughts:  No  Memory:  Immediate;   Good Recent;   Good Remote;   Good  Judgement:  Fair  Insight:  Fair  Psychomotor Activity:  Tremor  Concentration:  Fair  Recall:  NA  Fund of Knowledge:Good  Language: Good  Akathisia:  NA  Handed:  Right  AIMS (if indicated):     Assets:  Desire for Improvement  Sleep:       Musculoskeletal: Strength & Muscle Tone: within normal limits Gait & Station: normal Patient leans: N/A  Past Psychiatric History: Diagnosis:  Hospitalizations:  Outpatient Care:  Substance Abuse Care:  Self-Mutilation:  Suicidal Attempts:  Violent Behaviors:   Past Medical History:  Past Medical History  Diagnosis Date  . Allergy   . Asthma   . Pulmonary tuberculosis    None. Allergies:   Allergies  Allergen Reactions  . Cephalexin     REACTION: nausea, vomiting   PTA Medications: Prescriptions prior to admission  Medication Sig Dispense Refill Last Dose  . albuterol (PROVENTIL HFA;VENTOLIN HFA) 108 (90 BASE)  MCG/ACT inhaler Inhale 2 puffs into the lungs every 4 (four) hours as needed for wheezing or shortness of breath. 1 Inhaler 11 Unk    Previous Psychotropic Medications:  Medication/Dose                 Substance Abuse History in the last 12 months:  Yes.    Consequences of Substance Abuse: NA  Social History:  reports that he has been smoking Cigarettes.  He has been smoking about 0.50 packs per day. He has never used smokeless tobacco. He reports that he does not drink alcohol or use illicit drugs. Additional Social History: Pain Medications: Percocet and Vicodin History of alcohol / drug use?: Yes Negative Consequences of Use: Financial, Personal relationships Withdrawal Symptoms: Irritability                    Current Place of Residence:   Place of Birth:   Family Members: Marital Status:  Single Children:  Sons:  Daughters: Relationships: Education:  HS Soil scientist Problems/Performance: Religious Beliefs/Practices: History of Abuse (Emotional/Phsycial/Sexual) Occupational Experiences; Military History:  None. Legal History: Hobbies/Interests:  Family History:  History reviewed. No pertinent family history.  Results for orders placed or performed during the hospital encounter of 10/13/14 (from the past 72 hour(s))  Acetaminophen level     Status: None   Collection Time: 10/13/14  1:35 PM  Result Value Ref Range   Acetaminophen (Tylenol), Serum <15.0 10 - 30 ug/mL    Comment:        THERAPEUTIC CONCENTRATIONS VARY SIGNIFICANTLY. A RANGE OF 10-30 ug/mL MAY BE AN EFFECTIVE CONCENTRATION FOR MANY PATIENTS. HOWEVER, SOME ARE BEST TREATED AT CONCENTRATIONS OUTSIDE THIS RANGE. ACETAMINOPHEN CONCENTRATIONS >150 ug/mL AT 4 HOURS AFTER INGESTION AND >50 ug/mL AT 12 HOURS AFTER INGESTION ARE OFTEN ASSOCIATED WITH TOXIC REACTIONS.   CBC     Status: Abnormal   Collection Time: 10/13/14  1:35 PM  Result Value Ref Range   WBC 10.0 4.0 - 10.5  K/uL   RBC 5.41 4.22 - 5.81 MIL/uL   Hemoglobin 14.8 13.0 - 17.0 g/dL   HCT 41.8 39.0 - 52.0 %   MCV 77.3 (L) 78.0 - 100.0 fL   MCH 27.4 26.0 - 34.0 pg   MCHC 35.4 30.0 - 36.0 g/dL   RDW 13.6 11.5 - 15.5 %   Platelets 334 150 - 400 K/uL  Comprehensive metabolic panel     Status: None   Collection Time: 10/13/14  1:35 PM  Result Value Ref Range   Sodium 140 137 - 147 mEq/L   Potassium 4.9 3.7 - 5.3 mEq/L   Chloride 100 96 - 112 mEq/L   CO2 28 19 - 32 mEq/L   Glucose, Bld 82 70 - 99 mg/dL   BUN 12 6 - 23 mg/dL   Creatinine, Ser 0.96 0.50 - 1.35 mg/dL   Calcium 10.2 8.4 - 10.5 mg/dL   Total Protein 7.3 6.0 - 8.3 g/dL   Albumin 4.2 3.5 - 5.2 g/dL   AST 23 0 - 37 U/L   ALT 24 0 - 53 U/L   Alkaline Phosphatase  59 39 - 117 U/L   Total Bilirubin 0.3 0.3 - 1.2 mg/dL   GFR calc non Af Amer >90 >90 mL/min   GFR calc Af Amer >90 >90 mL/min    Comment: (NOTE) The eGFR has been calculated using the CKD EPI equation. This calculation has not been validated in all clinical situations. eGFR's persistently <90 mL/min signify possible Chronic Kidney Disease.    Anion gap 12 5 - 15  Ethanol (ETOH)     Status: Abnormal   Collection Time: 10/13/14  1:35 PM  Result Value Ref Range   Alcohol, Ethyl (B) 24 (H) 0 - 11 mg/dL    Comment:        LOWEST DETECTABLE LIMIT FOR SERUM ALCOHOL IS 11 mg/dL FOR MEDICAL PURPOSES ONLY   Salicylate level     Status: Abnormal   Collection Time: 10/13/14  1:35 PM  Result Value Ref Range   Salicylate Lvl <1.6 (L) 2.8 - 20.0 mg/dL  Urine Drug Screen     Status: None   Collection Time: 10/13/14  1:38 PM  Result Value Ref Range   Opiates NONE DETECTED NONE DETECTED   Cocaine NONE DETECTED NONE DETECTED   Benzodiazepines NONE DETECTED NONE DETECTED   Amphetamines NONE DETECTED NONE DETECTED   Tetrahydrocannabinol NONE DETECTED NONE DETECTED   Barbiturates NONE DETECTED NONE DETECTED    Comment:        DRUG SCREEN FOR MEDICAL PURPOSES ONLY.  IF  CONFIRMATION IS NEEDED FOR ANY PURPOSE, NOTIFY LAB WITHIN 5 DAYS.        LOWEST DETECTABLE LIMITS FOR URINE DRUG SCREEN Drug Class       Cutoff (ng/mL) Amphetamine      1000 Barbiturate      200 Benzodiazepine   837 Tricyclics       290 Opiates          300 Cocaine          300 THC              50    Psychological Evaluations:  Assessment:   DSM5:  Schizophrenia Disorders:   Obsessive-Compulsive Disorders:   Trauma-Stressor Disorders:   Substance/Addictive Disorders:  Alcohol Related Disorder - Severe (303.90), OPIATE DEPENDENCE Depressive Disorders:  Major Depressive Disorder - Moderate (296.22), Substance induced mood disorder.         Past Medical History  Diagnosis Date  . Allergy   . Asthma   . Pulmonary tuberculosis      Treatment Plan/Recommendations:   Admit for crisis management/stabilization. Review and reinstate any pertinent home medications for other health issues.   Medication management to treat current mood problems Continue using our Clonidine detox protocol for his Opiates Detox. Group counseling sessions and activities. Primary care consults as needed. Continue current treatment plan .  Treatment Plan Summary: Daily contact with patient to assess and evaluate symptoms and progress in treatment Medication management Current Medications:  Current Facility-Administered Medications  Medication Dose Route Frequency Provider Last Rate Last Dose  . alum & mag hydroxide-simeth (MAALOX/MYLANTA) 200-200-20 MG/5ML suspension 30 mL  30 mL Oral PRN Waylan Boga, NP      . cloNIDine (CATAPRES) tablet 0.1 mg  0.1 mg Oral QID Waylan Boga, NP   0.1 mg at 10/13/14 2248   Followed by  . [START ON 10/16/2014] cloNIDine (CATAPRES) tablet 0.1 mg  0.1 mg Oral BH-qamhs Waylan Boga, NP       Followed by  . [START ON 10/18/2014] cloNIDine (CATAPRES) tablet 0.1  mg  0.1 mg Oral QAC breakfast Waylan Boga, NP      . dicyclomine (BENTYL) tablet 20 mg  20 mg Oral Q6H  PRN Waylan Boga, NP      . hydrOXYzine (ATARAX/VISTARIL) tablet 25 mg  25 mg Oral Q6H PRN Waylan Boga, NP      . ibuprofen (ADVIL,MOTRIN) tablet 600 mg  600 mg Oral Q8H PRN Waylan Boga, NP      . loperamide (IMODIUM) capsule 2-4 mg  2-4 mg Oral PRN Waylan Boga, NP      . methocarbamol (ROBAXIN) tablet 500 mg  500 mg Oral Q8H PRN Waylan Boga, NP      . ondansetron (ZOFRAN) tablet 4 mg  4 mg Oral Q8H PRN Waylan Boga, NP      . ondansetron (ZOFRAN-ODT) disintegrating tablet 4 mg  4 mg Oral Q6H PRN Waylan Boga, NP      . zolpidem (AMBIEN) tablet 5 mg  5 mg Oral QHS PRN Waylan Boga, NP        Observation Level/Precautions:  15 minute checks  Laboratory:  CBC Chemistry Profile UDS alcohol level  Psychotherapy:    Medications:    Consultations:    Discharge Concerns:    Estimated LOS:  Other:     I certify that inpatient services furnished can reasonably be expected to improve the patient's condition.   Charmaine Downs, C    PMHNP-BC 11/8/201511:41 AM I agree with assessment and plan Geralyn Flash A. Sabra Heck, M.D.

## 2014-10-14 NOTE — Progress Notes (Signed)
Patient did attend the evening speaker AA meeting.  

## 2014-10-14 NOTE — Progress Notes (Signed)
Patient ID: Dalton [name], male   DOB: Feb 17, 1986, 28 y.o.   MRN: [id number] [facility] hall [facility]; an order was obtained to move this pt to the 300 hall to room 302-1: report was given to sara, rn.

## 2014-10-15 DIAGNOSIS — F1123 Opioid dependence with withdrawal: Principal | ICD-10-CM

## 2014-10-15 DIAGNOSIS — F329 Major depressive disorder, single episode, unspecified: Secondary | ICD-10-CM

## 2014-10-15 MED ORDER — TRAZODONE HCL 50 MG PO TABS
50.0000 mg | ORAL_TABLET | Freq: Every evening | ORAL | Status: DC | PRN
  Administered 2014-10-15 – 2014-10-18 (×7): 50 mg via ORAL
  Filled 2014-10-15: qty 6
  Filled 2014-10-15 (×2): qty 1
  Filled 2014-10-15: qty 6
  Filled 2014-10-15 (×7): qty 1
  Filled 2014-10-15 (×2): qty 6

## 2014-10-15 NOTE — Clinical Social Work Note (Signed)
With patient permission, referral made to [facility] for [facility]- may have bed availability this week.  [facility] until the end of next week.   Samuella BruinKristin Maryjo Ragon, MSW, [employer]CSWA Clinical Social Worker [facility] [phone]

## 2014-10-15 NOTE — Progress Notes (Signed)
Writer has observed patient up in the dayroom watching tv and minimal interaction with peers. Writer spoke with him 1:1 and discussed his medications, scheduled and prn. Patient reports legs aching d received a robaxin along with his scheduled clonidine. Writer encouraged fluids and patient was given a pitcher of gatorade. He currently denies si/hi/a/v hallucinations. Patient attended AA this evening.  Patient is orienting to unit and schedule. He currently denies si/hi/a/v hallucinations. Safety maintained on unit with 15 min checks.

## 2014-10-15 NOTE — [facility] Suicide Risk Assessment (Signed)
   Nursing information obtained from:  Patient Demographic factors:  Male Current Mental Status:  NA (denies) Loss Factors:  NA Historical Factors:  Family history of mental illness or substance abuse Risk Reduction Factors:  Employed, Living with another person, especially a relative, Positive social support, Positive therapeutic relationship Total Time spent with patient: 45 minutes  CLINICAL FACTORS:   Opiate Dependence, Depression  Psychiatric Specialty Exam: Physical Exam  ROS  Blood pressure 98/53, pulse 66, temperature 98.3 F (36.8 C), temperature source Oral, resp. rate 16, height 5\' 11"  (1.803 m), weight 62.143 kg (137 lb), SpO2 99 %.Body mass index is 19.12 kg/(m^2).  SEE ADMIT NOTE MSE   COGNITIVE FEATURES THAT CONTRIBUTE TO RISK:  Closed-mindedness    SUICIDE RISK:   Mild:  Suicidal ideation of limited frequency, intensity, duration, and specificity.  There are no identifiable plans, no associated intent, mild dysphoria and related symptoms, good self-control (both objective and subjective assessment), few other risk factors, and identifiable protective factors, including available and accessible social support.  PLAN OF CARE: Admit patient to inpatient unit for the purpose of detoxification/ management of withdrawal. Provide support and encouragement regarding sobriety/abstinence efforts. Will follow daily.   I certify that inpatient services furnished can reasonably be expected to improve the patient's condition.  Rosland Riding 10/15/2014, 3:47 PM

## 2014-10-15 NOTE — Plan of Care (Signed)
Problem: Alteration in mood & ability to function due to Goal: STG-Patient will report withdrawal symptoms Outcome: Progressing Patient reported withdrawal symptom to nurse and was medicated accordingly.
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
## 2014-10-15 NOTE — Progress Notes (Signed)
The focus of this group is to help patients review their daily goal of treatment and discuss progress on daily workbooks. Pt stated that his goal for the day was to "learn to live with my regrets and mistakes." Pt stated that he made progress toward this goal in that he didn't think about his regrets and mistakes as much today as he normally does, which is what usually prompts him to use substances. Writer discussed with pt that learning to live with one's regrets and mistakes takes time, and usually doesn't occur over night. Pt agreed. Pt endorsed that he is considering pursuing psychotherapy post discharge.

## 2014-10-15 NOTE — Plan of Care (Signed)
Problem: Alteration in mood & ability to function due to Goal: STG-Patient will attend groups Outcome: Progressing Patient attended AA group this evening     
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
## 2014-10-15 NOTE — [facility] Suicide Risk Assessment (Signed)
[facility] INPATIENT:  Family/Significant Other Suicide Prevention Education  Suicide Prevention Education:  Education Completed; Mother Apolonio SchneidersBetty Lea [phone],  (name of family member/significant other) has been identified by the patient as the family member/significant other with whom the patient will be residing, and identified as the person(s) who will aid the patient in the event of a mental health crisis (suicidal ideations/suicide attempt).  With written consent from the patient, the family member/significant other has been provided the following suicide prevention education, prior to the and/or following the discharge of the patient.  The suicide prevention education provided includes the following:  Suicide risk factors  Suicide prevention and interventions  National Suicide Hotline telephone number  [facility] assessment telephone number  [facility] Assistance 911  [facility] and/or Residential Mobile Crisis Unit telephone number  Request made of family/significant other to:  Remove weapons (e.g., guns, rifles, knives), all items previously/currently identified as safety concern.    Remove drugs/medications (over-the-counter, prescriptions, illicit drugs), all items previously/currently identified as a safety concern.  The family member/significant other verbalizes understanding of the suicide prevention education information provided.  The family member/significant other agrees to remove the items of safety concern listed above.  Malissia Rabbani, [place] CarboKristin L 10/15/2014, 4:46 PM

## 2014-10-15 NOTE — Progress Notes (Signed)
D: Patient's affect appropriate to circumstance and mood is depressed. He reported on the self inventory sheet that his sleep and appetite are fair, energy level is low and ability to concentrate is poor. Patient rates depression/anxiety "8" and feelings of hopelessness "6". Writer observed that the patient has been resting in bed the majority of the day. He complained of muscle spasms. Patient is compliant with medication regimen.  A: Support and encouragement provided to patient. Scheduled medications administered per MD orders. PRN Robaxin given for spasms. Maintain Q15 minute checks for safety.  R: Patient receptive. Denies SI/HI/AVH. Patient remains safe.

## 2014-10-16 MED ORDER — CITALOPRAM HYDROBROMIDE 20 MG PO TABS
20.0000 mg | ORAL_TABLET | Freq: Every day | ORAL | Status: DC
  Administered 2014-10-16 – 2014-10-19 (×4): 20 mg via ORAL
  Filled 2014-10-16 (×3): qty 1
  Filled 2014-10-16 (×2): qty 3
  Filled 2014-10-16 (×2): qty 1

## 2014-10-16 NOTE — Progress Notes (Signed)
Recreation Therapy Notes  Animal-Assisted Activity/Therapy (AAA/T) Program Checklist/Progress Notes Patient Eligibility Criteria Checklist & Daily Group note for Rec Tx Intervention  Date: 11.10.2015 Time: 2:45pm Location: [address] Dayroom   AAA/T Program Assumption of Risk Form signed by Patient/ or Parent Legal Guardian yes  Patient is free of allergies or sever asthma yes  Patient reports no fear of animals yes  Patient reports no history of cruelty to animals yes   Patient understands his/her participation is voluntary yes  Patient washes hands before animal contact yes  Patient washes hands after animal contact yes  Behavioral Response: Appropriate, Observation   Education: Hand Washing, Appropriate Animal Interaction   Education Outcome: Acknowledges education.   Clinical Observations/Feedback: Patient observed session and interacted appropriately with other group members.   Marykay Lexenise L Kohler Pellerito, LRT/CTRS  Jearl KlinefelterBlanchfield, Aalaiyah Yassin L 10/16/2014 4:38 PM

## 2014-10-16 NOTE — Progress Notes (Signed)
D: Patient is oriented and lethargic. Pt's mood is depressed and affect is flat. Pt remains in bed this am. Pt reports his depression 8/10, hopelessness, 6/10, and anxiety 8/10. Pt denies SI/HI and AVH. Pt denies withdrawal symptoms at this time. Pt states his goal for the day is "learning to let go of regrets" by "think about positive things." Pt's blood pressure was 82/69, pulse 86 while standing at 0619. BP increased to 100/53, pulse 60 at 0935. A: Gatorade given to pt. Fall precautions reviewed with pt. BP education reviewed with pt. 0800 Clonidine dose held d/t BP not within order parameters. 15 minute checks completed per protocol for pt safety. R: Pt cooperative and receptive to nursing interventions.

## 2014-10-16 NOTE — Progress Notes (Signed)
Pt reports his withdrawal symptoms are mild to moderate this evening.  He denies SI/HI/AV.  He says he is going to go to long term treatment for his opiate abuse.  He says his girlfriend is supportive and he wants to get control of his opiate use.  Pt says he attended groups today.  He was encouraged to make his needs known to staff.  Support and encouragement offered.  Safety maintained with q15 minute checks.

## 2014-10-16 NOTE — BHH Group Notes (Addendum)
LATE ENTRY FROM Monday 10/15/14 AT 1:15 PM   BHH LCSW Group Therapy 10/15/2014  1:15 pm  Type of Therapy: Group Therapy Participation Level: Active  Participation Quality: Attentive, Sharing and Supportive  Affect: Depressed and Flat  Cognitive: Alert and Oriented  Insight: Developing/Improving and Engaged  Engagement in Therapy: Developing/Improving and Engaged  Modes of Intervention: Clarification, Confrontation, Discussion, Education, Exploration,  Limit-setting, Orientation, Problem-solving, Rapport Building, Dance movement psychotherapisteality Testing, Socialization and Support  Summary of Progress/Problems: Pt identified obstacles faced currently and processed barriers involved in overcoming these obstacles. Pt identified steps necessary for overcoming these obstacles and explored motivation (internal and external) for facing these difficulties head on. Pt further identified one area of concern in their lives and chose a goal to focus on for today. Patient discussed his primary obstacle as living with regret. CSW's and other group members provided patient with emotional support and encouragement.  Dalton [name], MSW, [employer] Clinical Social Worker [facility] [phone]

## 2014-10-16 NOTE — BHH Group Notes (Signed)
Adult Psychoeducational Group Note  Date:  10/16/2014 Time:  10:36 PM  Group Topic/Focus:  AA Meeting  Participation Level:  Active  Participation Quality:  Appropriate  Affect:  Appropriate  Cognitive:  Appropriate  Insight: Appropriate  Engagement in Group:  Engaged  Modes of Intervention:  Discussion and Education  Additional Comments:  Denyse AmassCorey attended and was engaged in group.  Caroll RancherLindsay, Abryanna Musolino A 10/16/2014, 10:36 PM

## 2014-10-16 NOTE — Progress Notes (Signed)
[facility]  10/16/2014 1:55 PM Dalton GrapesCorey D [name]  MRN:  [id number] Subjective:  Patient describes mild symptoms of withdrawal, such as muscle cramps and mild nausea. He also states he feels depressed, anxious, and is interested in starting medication for this. Objective: Patient reports symptoms of opiate withdrawal as above. He has had no vomiting or significant diarrhea. His vitals are stable. He does not appear uncomfortable or in any acute distress. He reports lingering depression, sadness, and anxiety. He denies any suicidal ideations or psychotic symptoms. He is tolerating Clonidine taper as per opiate detox protocol well. No behavioral issues on unit. Going to groups. Diagnosis:   Opiate Dependence, Opiate Withdrawal, Depression NOS  Total Time spent with patient: 20 minutes   ADL's: good   Sleep:good   Appetite:good   Suicidal Ideation:  Denies  Homicidal Ideation:  Denies  AEB (as evidenced by):  Psychiatric Specialty Exam: Physical Exam  Review of Systems  Constitutional: Negative for fever and chills.  Eyes: Negative.   Respiratory: Negative for cough and shortness of breath.   Cardiovascular: Negative for chest pain.  Gastrointestinal: Positive for nausea. Negative for vomiting, abdominal pain and diarrhea.  Musculoskeletal: Positive for myalgias.  Skin: Negative for rash.  Neurological: Negative for headaches.  Psychiatric/Behavioral: Positive for depression and substance abuse. The patient is nervous/anxious.     Blood pressure 110/61, pulse 70, temperature 97.8 F (36.6 C), temperature source Oral, resp. rate 18, height 5\' 11"  (1.803 m), weight 62.143 kg (137 lb), SpO2 99 %.Body mass index is 19.12 kg/(m^2).  General Appearance: Well Groomed  Patent attorneyye Contact::  Good  Speech:  Normal Rate  Volume:  Normal  Mood:  Depressed  Affect:  constricted but reactive affect   Thought Process:  Goal Directed and Linear  Orientation:  Full (Time, Place, and  Person)  Thought Content:  denies hallucinations, no delusions  Suicidal Thoughts:  No- at this time denies any suicidal or homicidal ideations  Homicidal Thoughts:  No  Memory:  recent and remote grossly intact   Judgement:  Fair  Insight:  Fair  Psychomotor Activity:  Normal  Concentration:  Good  Recall:  Good  Fund of Knowledge:Good  Language: Good  Akathisia:  Negative  Handed:  Right  AIMS (if indicated):     Assets:  Communication Skills Desire for Improvement Physical Health Resilience Social Support Vocational/Educational  Sleep:  Number of Hours: 6.75   Musculoskeletal: Strength & Muscle Tone: within normal limits Gait & Station: normal Patient leans: N/A  Current Medications: Current Facility-Administered Medications  Medication Dose Route Frequency Provider Last Rate Last Dose  . alum & mag hydroxide-simeth (MAALOX/MYLANTA) 200-200-20 MG/5ML suspension 30 mL  30 mL Oral PRN Nanine MeansJamison Lord, NP      . citalopram (CELEXA) tablet 20 mg  20 mg Oral Daily Fernando A Cobos, MD      . cloNIDine (CATAPRES) tablet 0.1 mg  0.1 mg Oral BH-qamhs Nanine MeansJamison Lord, NP   Stopped at 10/16/14 0845   Followed by  . [START ON 10/18/2014] cloNIDine (CATAPRES) tablet 0.1 mg  0.1 mg Oral QAC breakfast Nanine MeansJamison Lord, NP      . dicyclomine (BENTYL) tablet 20 mg  20 mg Oral Q6H PRN Nanine MeansJamison Lord, NP      . hydrOXYzine (ATARAX/VISTARIL) tablet 25 mg  25 mg Oral Q6H PRN Nanine MeansJamison Lord, NP   25 mg at 10/15/14 1946  . ibuprofen (ADVIL,MOTRIN) tablet 600 mg  600 mg Oral Q8H PRN Nanine MeansJamison Lord, NP      .  loperamide (IMODIUM) capsule 2-4 mg  2-4 mg Oral PRN Nanine MeansJamison Lord, NP      . methocarbamol (ROBAXIN) tablet 500 mg  500 mg Oral Q8H PRN Nanine MeansJamison Lord, NP   500 mg at 10/16/14 1211  . nicotine polacrilex (NICORETTE) gum 2 mg  2 mg Oral PRN Rachael FeeIrving A Lugo, MD      . ondansetron [facility](ZOFRAN) tablet 4 mg  4 mg Oral Q8H PRN Nanine MeansJamison Lord, NP      . ondansetron (ZOFRAN-ODT) disintegrating tablet 4 mg  4 mg Oral Q6H  PRN Nanine MeansJamison Lord, NP      . traZODone (DESYREL) tablet 50 mg  50 mg Oral QHS,MR X 1 Kerry HoughSpencer E Simon, PA-C   50 mg at 10/15/14 2203    Lab Results: No results found for this or any previous visit (from the past 48 hour(s)).  Physical Findings: AIMS: Facial and Oral Movements Muscles of Facial Expression: None, normal Lips and Perioral Area: None, normal Jaw: None, normal,Extremity Movements Upper (arms, wrists, hands, fingers): None, normal Lower (legs, knees, ankles, toes): None, normal, Trunk Movements Neck, shoulders, hips: None, normal, Overall Severity Severity of abnormal movements (highest score from questions above): None, normal Incapacitation due to abnormal movements: None, normal Patient's awareness of abnormal movements (rate only patient's report): No Awareness, Dental Status Current problems with teeth and/or dentures?: No Does patient usually wear dentures?: No  CIWA:  CIWA-Ar Total: 4 COWS:  COWS Total Score: 0  Assessment: Patient is doing better, with relatively mild symptoms of opiate withdrawal at this time, and tolerating clonidine taper well. He does report ongoing depression, free floating anxiety, but denies any suicidal ideations. Engaged in milieu. Agrees to SSRI trial- we discussed side effects and rationale, to include potential for sexual dysfunction.  Treatment Plan Summary: Daily contact with patient to assess and evaluate symptoms and progress in treatment Medication management See below  Plan: Continue inpatient treatment and support. Continue Clonidine taper  Start Celexa 20 mgrs QDAY Vistaril PRNS for anxiety as needed Patient interested in going to an inpatient Rehab after discharge, if possible.  Medical Decision Making Problem Points:  Established problem, stable/improving (1), Review of last therapy session (1) and Review of psycho-social stressors (1) Data Points:  Review of medication regiment & side effects (2) Review of new medications  or change in dosage (2)  I certify that inpatient services furnished can reasonably be expected to improve the patient's condition.   COBOS, FERNANDO 10/16/2014, 1:55 PM

## 2014-10-16 NOTE — BHH Group Notes (Addendum)
LATE NOTE FROM 10/15/14 8:45 AM    [facility]  10/15/14  8:45 AM   Participation Quality: Alert, Appropriate and Oriented  Mood/Affect: Depressed and Flat  Depression Rating: 7/8  Anxiety Rating: 7/8  Thoughts of Suicide: Pt denies SI/HI  Will you contract for safety? Yes  Current AVH: Pt denies  Plan for Discharge/Comments: Pt attended discharge planning group and actively participated in group. CSW provided pt with today's workbook. Patient requesting residential treatment at this time.  Transportation Means: Pt reports access to transportation  Supports: Patient identified his girlfriend and family as supports   Samuella BruinKristin Adrijana Haros, MSW, [employer]CSWA Clinical Social Worker [facility] [phone]

## 2014-10-16 NOTE — BHH Group Notes (Signed)
BHH LCSW Group Therapy 10/16/2014  1:15 PM   Type of Therapy: Group Therapy  Participation Level: Did Not Attend.   Samuella BruinKristin Jia Dottavio, MSW, [employer]CSWA Clinical Social Worker [facility] [phone]

## 2014-10-16 NOTE — Tx Team (Signed)
Interdisciplinary Treatment Plan Update (Adult) Date: 10/16/2014   Time Reviewed: 9:30 AM  Progress in Treatment: Attending groups: Yes Participating in groups: Yes Taking medication as prescribed: Yes Tolerating medication: Yes Family/Significant other contact made: Yes, CSW has spoken with patient's mother Patient understands diagnosis: Yes Discussing patient identified problems/goals with staff: Yes Medical problems stabilized or resolved: Yes Denies suicidal/homicidal ideation: Yes Issues/concerns per patient self-inventory: Yes Other:  New problem(s) identified: N/A  Discharge Plan or Barriers: Patient requesting residential treatment for substance abuse at this time.   Reason for Continuation of Hospitalization:  Depression Anxiety Medication Stabilization   Comments: N/A  Estimated length of stay: 2-4 days  For review of initial/current patient goals, please see plan of care. 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 [place] girlfriend and her 27 year old daughter. Patient reports experiencing SI at time of admission and is requesting residential treatment from prescription opiate and alcohol use. Patient will benefit from crisis stabilization, medication evaluation, group therapy, and psycho education in addition to case management for discharge planning. Patient and CSW reviewed pt's identified goals and treatment plan. Pt verbalized understanding and agreed to treatment plan.   Attendees: Patient:    Family:    Physician: Dr. Jama Flavorsobos; Dr. Dub MikesLugo 10/16/2014 9:30 AM  Nursing: Riley LamBrittany Guthre, [place] Minersonna Shimp, RN 10/16/2014 9:30 AM  Clinical Social Worker: Samuella BruinKristin Dexton Zwilling,  LCSWA 10/16/2014 9:30 AM  Other: Juline PatchQuylle Hodnett, LCSW 10/16/2014 9:30 AM  Other: Leisa LenzValerie Enoch, Vesta [facility] Liaison 10/16/2014 9:30  AM  Other: Onnie BoerJennifer Clark, Case Manager 10/16/2014 9:30 AM  Other:  10/16/2014 9:30 AM  Other:    Other:    Other:    Other:    Other:      Scribe for Treatment Team:  Samuella BruinKristin Malayna Noori, MSW, [employer]CSWA [phone]

## 2014-10-17 NOTE — Progress Notes (Signed)
D:  Patient's self inventory sheet, patient has fair sleep, sleep medication is helpful.  Good appetite, normal energy level, poor concentration.  Rated depression 7, hopeless 4, anxiety 8.  Has experienced withdrawals in past 24 hours, cramping, agitation, irritable.  Denied SI.  Physical problems pain, legs, lower body, worst pain #6.  Goal is to learn to live with regrets.  Goal is to focus on today.  No discharge plans.  No problems anticipated after discharge. A:  Medications administered per MD orders.  Emotional support and encouragement given patient. R:  Denied SI and HI, contracts for safety.   Denied A/V hallucinations.  Safety maintained with 15 minute checks.

## 2014-10-17 NOTE — [facility] Group Notes (Signed)
   [facility]  10/17/2014  8:45 AM   Participation Quality: Alert, Appropriate and Oriented  Mood/Affect: Depressed and Flat  Depression Rating: 3  Anxiety Rating: 8  Thoughts of Suicide: Pt denies SI/HI  Will you contract for safety? Yes  Current AVH: Pt denies  Plan for Discharge/Comments: Pt attended discharge planning group and actively participated in group. CSW provided pt with today's workbook. Patient reports feeling "alright" today. He is agreeable to [facility] [place] [place] for continued substance abuse treatment.  Transportation Means: Pt reports access to transportation  Supports: Patient identified his family as supportive.  Samuella BruinKristin Zaryia Markel, MSW, [employer]CSWA Clinical Social Worker [facility] [phone]

## 2014-10-17 NOTE — Clinical Social Work Note (Signed)
CSW spoke with Larita FifeLynn at [facility]. Patient has been accepted to facility for Friday 10/19/14.   Samuella BruinKristin Aztlan Coll, MSW, [employer]CSWA Clinical Social Worker [facility] [phone]

## 2014-10-17 NOTE — Progress Notes (Signed)
Adult Psychoeducational Group Note  Date:  10/17/2014 Time:  11:23 PM  Group Topic/Focus:  Narcotics Anonymous  Participation Level:  Active  Participation Quality:  Appropriate  Affect:  Appropriate  Cognitive:  Appropriate  Insight: Appropriate  Engagement in Group:  Engaged  Modes of Intervention:  Education  Additional Comments:  Pt attended and participated in group.  Berlin Hunuttle, Ejay Lashley M 10/17/2014, 11:23 PM

## 2014-10-17 NOTE — BHH Group Notes (Signed)
[facility] LCSW Group Therapy 10/17/2014  1:15 PM   Type of Therapy: Group Therapy  Participation Level: Did Not Attend- patient in bed sleeping.   Samuella BruinKristin Harpreet Pompey, MSW, [employer]CSWA Clinical Social Worker [facility] [phone]

## 2014-10-17 NOTE — Progress Notes (Signed)
Pt was observed sitting in the dayroom at the beginning of the shift watching TV and talking with peers.  Pt reports he is still experiencing significant anxiety today.  Other withdrawal symptoms are mild at this time.  He is still looking to [facility] [place] and says he is supposed to find out soon whether he will go there Thursday or Friday.  Pt says his family is supportive.  Pt makes his needs known to staff.  Pt has been encouraged to drink more fluids d/t low BP since he is on the clonidine protocol for the opiate withdrawals.  Pt pleasant/cooperative with staff.  Support and encouragement offered.  Safety maintained with q15 minute checks.

## 2014-10-17 NOTE — Plan of Care (Signed)
Problem: Consults Goal: Substance Abuse Patient Education See Patient Education Module for education specifics.  Outcome: Completed/Met Date Met:  10/17/14 Denied SI, contracts for safety.

## 2014-10-17 NOTE — Plan of Care (Signed)
Problem: Diagnosis: Increased Risk For Suicide Attempt Goal: STG-Patient Will Comply With Medication Regime Outcome: Progressing Pt is med compliant and taking his medications as ordered per detox protocol.

## 2014-10-17 NOTE — Progress Notes (Signed)
Patient ID: Dalton [name], male   DOB: 17-Dec-1985, 28 y.o.   MRN: [id number] [facility] MD Progress Note  10/17/2014 2:05 PM Dalton [name]  MRN:  [id number] Subjective:  Reports slowly improving symptoms of withdrawal- has some residual aches, myalgias. Denies diarrhea, vomiting or severe nausea. Describes anxiety, partly related to cravings, and partly related to withdrawal. Does acknowledge significant improvement compared to admission. Objective:  I have discussed case with treatment team and have met with patient. Patient has been accepted today to a [facility] in [place], [place], ( bed available this Friday) . Ptient states he is motivated in going there to continue working on early recovery. Patient reports symptoms of opiate withdrawal as above.His vitals  Remain stable. He has good PO intake . Depressive symptoms also improving. Less depressed, affect improved.  He is tolerating Clonidine taper  Well. He states his parents came to visit yesterday- visit went well and he feels supported by his family. No behavioral issues on unit. Going to groups. Diagnosis:   Opiate Dependence, Opiate Withdrawal, Depression NOS  Total Time spent with patient: 20 minutes   ADL's: good   Sleep:good   Appetite:good   Suicidal Ideation:  Denies  Homicidal Ideation:  Denies  AEB (as evidenced by):  Psychiatric Specialty Exam: Physical Exam  Review of Systems  Constitutional: Negative for fever and chills.  Eyes: Negative.   Respiratory: Negative for cough and shortness of breath.   Cardiovascular: Negative for chest pain.  Gastrointestinal: Positive for nausea. Negative for vomiting, abdominal pain and diarrhea.  Musculoskeletal: Positive for myalgias.  Skin: Negative for rash.  Neurological: Negative for headaches.  Psychiatric/Behavioral: Positive for depression and substance abuse. The patient is nervous/anxious.     Blood pressure 109/59, pulse 65, temperature 98 F (36.7 C), temperature  source Oral, resp. rate 16, height 5' 11" (1.803 m), weight 62.143 kg (137 lb), SpO2 99 %.Body mass index is 19.12 kg/(m^2).  General Appearance: Well Groomed  Engineer, water::  Good  Speech:  Normal Rate  Volume:  Normal  Mood:  less depressed  Affect:  appropriate, describes feeling anxious  Thought Process:  Goal Directed and Linear  Orientation:  Full (Time, Place, and Person)  Thought Content:  denies hallucinations, no delusions  Suicidal Thoughts:  No- at this time denies any suicidal or homicidal ideations  Homicidal Thoughts:  No  Memory:  recent and remote grossly intact   Judgement:  Other:  improved   Insight:  Present  Psychomotor Activity:  Normal  Concentration:  Good  Recall:  Good  Fund of Knowledge:Good  Language: Good  Akathisia:  Negative  Handed:  Right  AIMS (if indicated):     Assets:  Communication Skills Desire for Improvement Physical Health Resilience Social Support Vocational/Educational  Sleep:  Number of Hours: 6.5   Musculoskeletal: Strength & Muscle Tone: within normal limits Gait & Station: normal Patient leans: N/A  Current Medications: Current Facility-Administered Medications  Medication Dose Route Frequency Provider Last Rate Last Dose  . alum & mag hydroxide-simeth (MAALOX/MYLANTA) 200-200-20 MG/5ML suspension 30 mL  30 mL Oral PRN Waylan Boga, NP      . citalopram (CELEXA) tablet 20 mg  20 mg Oral Daily Jenne Campus, MD   20 mg at 10/17/14 0803  . cloNIDine (CATAPRES) tablet 0.1 mg  0.1 mg Oral BH-qamhs Waylan Boga, NP   0.1 mg at 10/17/14 0804   Followed by  . [START ON 10/18/2014] cloNIDine (CATAPRES) tablet 0.1 mg  0.1  mg Oral QAC breakfast Waylan Boga, NP      . dicyclomine (BENTYL) tablet 20 mg  20 mg Oral Q6H PRN Waylan Boga, NP      . hydrOXYzine (ATARAX/VISTARIL) tablet 25 mg  25 mg Oral Q6H PRN Waylan Boga, NP   25 mg at 10/17/14 0807  . ibuprofen (ADVIL,MOTRIN) tablet 600 mg  600 mg Oral Q8H PRN Waylan Boga, NP       . loperamide (IMODIUM) capsule 2-4 mg  2-4 mg Oral PRN Waylan Boga, NP      . methocarbamol (ROBAXIN) tablet 500 mg  500 mg Oral Q8H PRN Waylan Boga, NP   500 mg at 10/17/14 0807  . nicotine polacrilex (NICORETTE) gum 2 mg  2 mg Oral PRN Nicholaus Bloom, MD   2 mg at 10/16/14 1937  . ondansetron (ZOFRAN) tablet 4 mg  4 mg Oral Q8H PRN Waylan Boga, NP      . ondansetron (ZOFRAN-ODT) disintegrating tablet 4 mg  4 mg Oral Q6H PRN Waylan Boga, NP      . traZODone (DESYREL) tablet 50 mg  50 mg Oral QHS,MR X 1 Laverle Hobby, PA-C   50 mg at 10/16/14 2234    Lab Results: No results found for this or any previous visit (from the past 48 hour(s)).  Physical Findings: AIMS: Facial and Oral Movements Muscles of Facial Expression: None, normal Lips and Perioral Area: None, normal Jaw: None, normal,Extremity Movements Upper (arms, wrists, hands, fingers): None, normal Lower (legs, knees, ankles, toes): None, normal, Trunk Movements Neck, shoulders, hips: None, normal, Overall Severity Severity of abnormal movements (highest score from questions above): None, normal Incapacitation due to abnormal movements: None, normal Patient's awareness of abnormal movements (rate only patient's report): No Awareness, Dental Status Current problems with teeth and/or dentures?: No Does patient usually wear dentures?: No  CIWA:  CIWA-Ar Total: 4 COWS:  COWS Total Score: 3  Assessment: Patient is  Gradually improving, with some residual myalgias and aches related to opiate withdrawal. Mood improving as well. No SI at this time. Tolerating medications well. Has been accepted to a [facility] as of Friday.  Treatment Plan Summary: Daily contact with patient to assess and evaluate symptoms and progress in treatment Medication management See below  Plan: Continue inpatient treatment and support. Continue Clonidine taper  Continue Celexa 20 mgrs QDAY Vistaril PRNS for anxiety as needed Consider discharge  soon as he continues to improve- going to [place] [place] [facility] after discharge.  Medical Decision Making Problem Points:  Established problem, stable/improving (1), Review of last therapy session (1) and Review of psycho-social stressors (1) Data Points:  Review of medication regiment & side effects (2)  I certify that inpatient services furnished can reasonably be expected to improve the patient's condition.   COBOS, [place] 10/17/2014, 2:05 PM

## 2014-10-18 MED ORDER — TRAZODONE HCL 50 MG PO TABS: 50.0000 mg | ORAL_TABLET | Freq: Every evening | ORAL | Status: DC | PRN

## 2014-10-18 MED ORDER — CITALOPRAM HYDROBROMIDE 20 MG PO TABS: 20.0000 mg | ORAL_TABLET | Freq: Every day | ORAL | Status: DC

## 2014-10-18 NOTE — Progress Notes (Signed)
Pt came to writer early in the shift c/o anxiety, requesting prn for such.  Pt had just received his prn Vistaril just before shift change.  Writer informed pt that he did not have anything available and when his next medications were due.  Pt voiced understanding.  Pt returned to the dayroom where he has been watching TV and talking with peers.  Pt reports he was told he would be [facility] to [facility] the end of the week.  Pt is hopeful to get control of his substance abuse and stay clean.  Pt makes his needs known to staff.  Support and encouragement offered.  Safety maintained with q15 minute checks.

## 2014-10-18 NOTE — Progress Notes (Signed)
Patient ID: Dalton [name] Gaetz, male   DOB: 08-30-1986, 28 y.o.   MRN: [id number] [facility]  10/18/2014 4:02 PM Dalton [name] Lehn  MRN:  [id number] Subjective:   Patient more anxious today- he is scheduled to go to inpatient rehab at [place] [facility] [place], tomorrow, and is hoping to be able to spend time with his family, run some errands, pack clothes, etc. Before he goes. However, as discussed with Child psychotherapistocial Worker , who has been in contact with admitting staff at [facility], they will only accept him if he is discharged on day of admission- which is tomorrow. Also, mother has reported on a phone conversation that she would prefer for patient to stay until tomorrow to decrease risk of relapse today.  Objective:  Patient ambivalent about above, but decided to " stay " because " I know I need the Rehab". Initially anxious, and pacing, but later calmed down and was able to process the above issue with staff. No disruptive behaviors on unit. May be experiencing some cravings- states he continues to have some myalgias , and feels he is still having some residual withdrawal symptoms. Does not appear to be in any acute distress nor restless and vitals are stable. Going to groups. Denies medication side effects. Diagnosis:   Opiate Dependence, Opiate Withdrawal, Depression NOS  Total Time spent with patient: 20 minutes   ADL's: good   Sleep:good   Appetite:good   Suicidal Ideation:  Denies  Homicidal Ideation:  Denies  AEB (as evidenced by):  Psychiatric Specialty Exam: Physical Exam  Review of Systems  Constitutional: Negative for fever and chills.  Eyes: Negative.   Respiratory: Negative for cough and shortness of breath.   Cardiovascular: Negative for chest pain.  Gastrointestinal: Positive for nausea. Negative for vomiting, abdominal pain and diarrhea.  Musculoskeletal: Positive for myalgias.  Skin: Negative for rash.  Neurological: Negative for headaches.  Psychiatric/Behavioral:  Positive for depression and substance abuse. The patient is nervous/anxious.     Blood pressure 103/67, pulse 86, temperature 98 F (36.7 C), temperature source Oral, resp. rate 16, height 5\' 11"  (1.803 m), weight 62.143 kg (137 lb), SpO2 99 %.Body mass index is 19.12 kg/(m^2).  General Appearance: Well Groomed  Patent attorneyye Contact::  Good  Speech:  Normal Rate  Volume:  Normal  Mood:  less depressed  Affect:  more anxious today as he approaches discharge   Thought Process:  Goal Directed and Linear  Orientation:  Full (Time, Place, and Person)  Thought Content:  denies hallucinations, no delusions  Suicidal Thoughts:  No- at this time denies any suicidal or homicidal ideations  Homicidal Thoughts:  No  Memory:  recent and remote grossly intact   Judgement:  Other:  improved   Insight:  Present  Psychomotor Activity:  slightly restless   Concentration:  Good  Recall:  Good  Fund of Knowledge:Good  Language: Good  Akathisia:  Negative  Handed:  Right  AIMS (if indicated):     Assets:  Communication Skills Desire for Improvement Physical Health Resilience Social Support Vocational/Educational  Sleep:  Number of Hours: 6.25   Musculoskeletal: Strength & Muscle Tone: within normal limits Gait & Station: normal Patient leans: N/A  Current Medications: Current Facility-Administered Medications  Medication Dose Route Frequency Provider Last Rate Last Dose  . alum & mag hydroxide-simeth (MAALOX/MYLANTA) 200-200-20 MG/5ML suspension 30 mL  30 mL Oral PRN Nanine MeansJamison Lord, NP      . citalopram (CELEXA) tablet 20 mg  20 mg  Oral Daily Craige CottaFernando A Kapono Luhn, MD   20 mg at 10/18/14 [id number]  . cloNIDine (CATAPRES) tablet 0.1 mg  0.1 mg Oral QAC breakfast Nanine MeansJamison Lord, NP   0.1 mg at 10/18/14 0753  . dicyclomine (BENTYL) tablet 20 mg  20 mg Oral Q6H PRN Nanine MeansJamison Lord, NP      . hydrOXYzine (ATARAX/VISTARIL) tablet 25 mg  25 mg Oral Q6H PRN Nanine MeansJamison Lord, NP   25 mg at 10/18/14 1325  . ibuprofen  (ADVIL,MOTRIN) tablet 600 mg  600 mg Oral Q8H PRN Nanine MeansJamison Lord, NP   600 mg at 10/17/14 1710  . loperamide (IMODIUM) capsule 2-4 mg  2-4 mg Oral PRN Nanine MeansJamison Lord, NP      . methocarbamol (ROBAXIN) tablet 500 mg  500 mg Oral Q8H PRN Nanine MeansJamison Lord, NP   500 mg at 10/18/14 0752  . nicotine polacrilex (NICORETTE) gum 2 mg  2 mg Oral PRN Rachael FeeIrving A Lugo, MD   2 mg at 10/17/14 1807  . ondansetron (ZOFRAN) tablet 4 mg  4 mg Oral Q8H PRN Nanine MeansJamison Lord, NP      . ondansetron (ZOFRAN-ODT) disintegrating tablet 4 mg  4 mg Oral Q6H PRN Nanine MeansJamison Lord, NP      . traZODone (DESYREL) tablet 50 mg  50 mg Oral QHS,MR X 1 Kerry HoughSpencer E Simon, PA-C   50 mg at 10/17/14 2227    Lab Results: No results found for this or any previous visit (from the past 48 hour(s)).  Physical Findings: AIMS: Facial and Oral Movements Muscles of Facial Expression: None, normal Lips and Perioral Area: None, normal Jaw: None, normal Tongue: None, normal,Extremity Movements Upper (arms, wrists, hands, fingers): None, normal Lower (legs, knees, ankles, toes): None, normal, Trunk Movements Neck, shoulders, hips: None, normal, Overall Severity Severity of abnormal movements (highest score from questions above): None, normal Incapacitation due to abnormal movements: None, normal Patient's awareness of abnormal movements (rate only patient's report): No Awareness, Dental Status Current problems with teeth and/or dentures?: No Does patient usually wear dentures?: No  CIWA:  CIWA-Ar Total: 1 COWS:  COWS Total Score: 0  Assessment:  Patient is more anxious today as he approaches discharge. Increased anxiety and restlessness when he found out that accepting program [facility]) would only take him if he leaves hospital on day of admission and that advice from family was to stay due to risk of relapse in the interim . He does seem to be having some cravings, demonstrated by discomfort, irritability ( mild). I do think that allowing direct transfer  to discharge /accepting inpatient rehab ( family will transport) is warranted to decrease risk of relapse, particularly due to patient 's increased anxiety today.    Treatment Plan Summary: Daily contact with patient to assess and evaluate symptoms and progress in treatment Medication management See below  Plan: Continue inpatient treatment and support. Completing  Clonidine taper  Continue Celexa 20 mgrs QDAY Vistaril PRNS for anxiety as needed Ibuprofen/ Robaxin PRNS for myalgias/cramps  Consider discharge tomorrow in AM -  going to [place] [place] [facility] after [place].  Medical Decision Making Problem Points:  Established problem, stable/improving (1), Review of last therapy session (1) and Review of psycho-social stressors (1) Data Points:  Review of medication regiment & side effects (2)  I certify that inpatient services furnished can reasonably be expected to improve the patient's condition.   Elroy Schembri 10/18/2014, 4:02 PM

## 2014-10-18 NOTE — [facility] Group Notes (Signed)
[facility] LCSW Group Therapy 10/18/2014  1:15 PM   Type of Therapy: Group Therapy  Participation Level: Did Not Attend for unknown reason.   Samuella BruinKristin Mekhi Lascola, MSW, [employer]CSWA Clinical Social Worker [facility] [phone]

## 2014-10-18 NOTE — [facility] Group Notes (Signed)
0900 nursing orientation group    The focus of this group is to educate the patient on the purpose and policies of crisis stabilization and provide a format to answer questions about their admission.  The group details unit policies and expectations of patients while admitted.   Pt was an active participant and was appropriate in sharing.  

## 2014-10-18 NOTE — Progress Notes (Addendum)
Patient resting comfortably in bed with eyes closed.  No signs/symptoms of pain/distress noted on patient's face/body movements.  Safety maintained with 15 minute checks.  Patient denied SI and HI.  Denied A/V hallucinations.  Denied pain.  Patient has been sleeping this afternoon.

## 2014-10-18 NOTE — Progress Notes (Signed)
Patient ID: Dalton [name], male   DOB: March 25, 1986, 28 y.o.   MRN: [id number] He has been up and to groups interacting with peers and staff has requested and received prn medication for anxiety that was effective. He will be discharged tomorrow. Self inventory:  Depression 1 hopelessness 2, anxiety 8, withdrawal of craving ., cramping and irritation. He has been calm and copperative today.

## 2014-10-19 DIAGNOSIS — F1124 Opioid dependence with opioid-induced mood disorder: Secondary | ICD-10-CM

## 2014-10-19 DIAGNOSIS — F32A Depression, unspecified: Secondary | ICD-10-CM | POA: Insufficient documentation

## 2014-10-19 DIAGNOSIS — F329 Major depressive disorder, single episode, unspecified: Secondary | ICD-10-CM | POA: Insufficient documentation

## 2014-10-19 NOTE — Progress Notes (Signed)
D   Pt complained of some withdrawal symptoms especially leg cramps and anxiety   He has been compliant with treatment and interacts appropriately with staff and peers   He reports he was supposed to be discharged Thursday  and will possibly be discharged Friday  A   Verbal support given   Medications administered and effectiveness monitored   Q 15 min checks R   Pt safe at present

## 2014-10-19 NOTE — Clinical Social Work Note (Signed)
CSW met with patient to discuss discharge today. Patient reports that he feels ready to discharge to [facility] for continued treatment. Denies any SI/HI, depression, or anxiety. Patient informed that directions and contact information to treatment facility placed on his chart to be provided to him at discharge. Patient had no further questions for CSW.  Tilden Fossa, MSW, [employer] Worker [facility] [phone]

## 2014-10-19 NOTE — Progress Notes (Addendum)
D:  Patient's self inventory sheet, patient slept good last night, sleep medication is helpful.  Good appetite, normal energy level, good concentration.  Rated depression and hopeless 2, anxiety 8.  Withdrawals continue, crampinmg.  Denied SI.  Denied physical problems, pain.  Worst pain #8 in lower body.  Goal is to learn to live with regrets.  Plan is to focus on today.  Does have discharge plans.  No problems anticipated after discharge. A:  Medications administered per MD orders.  Emotional support and encouragement given patient. R:  Denied SI and HI.  Denied A/V hallucinations.  Safety maintained with 15 minute checks.

## 2014-10-19 NOTE — Progress Notes (Signed)
[facility] :  Will you be returning to the same living situation after discharge: No, patient will discharge to [facility] [place] [facility] [place] At discharge, do you have transportation home?:Yes,  patient's mother will transport Do you have the ability to pay for your medications:Yes,  patient will be provided prescriptions at discharge  Release of information consent forms completed and in the chart;  Patient's signature needed at discharge.  Patient to Follow up at: Follow-up Information    Follow up with [facility] On 10/19/2014.   Why:  Please present to [facility] on Friday 10/19/14 between 11 am and 9 pm. Please call if you are unable to make appointment.   Contact information:   [address], [place], [place] [id number] [phone]      Patient denies SI/HI:   Yes,  denies    Safety Planning and Suicide Prevention discussed:  Yes,  with patient and mother  Johny ChessDrinkard, Elain Wixon L 10/19/2014, 11:33 AM

## 2014-10-19 NOTE — Discharge Summary (Signed)
Physician Discharge Summary Note  Patient:  Dalton [name] is an 28 y.o., male MRN:  [id number] DOB:  1986/05/17 Patient phone:  [phone] (home)  Patient address:   [address] [place] [address],  Total Time spent with patient: 45 minutes  Date of Admission:  10/13/2014 Date of Discharge: 10/19/2014  Reason for Admission:   Chief Complaint: " I need detox" History of Present Illness:: Patient is a 28 year old male, who reports a history of opiate dependence. He states he has been using up to 150 mgrs of percocet or similar daily. He states that his girlfriend was becoming increasingly concerned about his drug use, and he agreed he needed help to address it, so decided to seek treatment. Last used yesterday. Also reports some depression and neuro-vegetative symptoms of depression as below, which he attributes to drug use. States he normally does not feel depressed other than due to drug abuse issues.  Elements: Severe opiate dependence, resulting in worsening social stressors and depression  Discharge Diagnoses: Active Problems:   Opiate dependence, continuous   Substance abuse   Depression   Psychiatric Specialty Exam: Physical Exam  Review of Systems  Constitutional: Negative.   HENT: Negative.   Eyes: Negative.   Respiratory: Negative.   Cardiovascular: Negative.   Gastrointestinal: Negative.   Genitourinary: Negative.   Musculoskeletal: Negative.   Skin: Negative.   Neurological: Negative.   Endo/Heme/Allergies: Negative.   Psychiatric/Behavioral: Positive for depression. The patient is nervous/anxious.     Blood pressure 105/70, pulse 96, temperature 97.7 F (36.5 C), temperature source Oral, resp. rate 18, height 5\' 11"  (1.803 m), weight 62.143 kg (137 lb), SpO2 99 %.Body mass index is 19.12 kg/(m^2).  SEE PSE in MD Suicide Risk Assessment  DSM5: AXIS I: Opiate Dependence, Opiate Withdrawal ( improved), Opiate Induced Mood Disorder, Depressed  ( improved) AXIS II: Deferred AXIS III:  Past Medical History  Diagnosis Date  . Allergy   . Asthma   . Pulmonary tuberculosis    AXIS IV: other psychosocial or environmental problems AXIS V: 65-70 upon discharge    Level of Care:  OP  Hospital Course:  Medications managed: Celexa 20mg  daily for depression; Trazodone 50mg  at night for insomnia; tolerated well. Psychoeducation, group and individual therapy. Pt currently denies SI, HI, and Psychosis. At discharge, pt rates anxiety and depression as minimal. Pt states that he does have a good supportive home environment and will followup with outpatient treatment. Affirms agreement with medication regimen and discharge plan. Denies other physical and psychological concerns at time of discharge.   At Discharge (From MD SRA):  Current Mental Status by Physician: Patient is improved compared to admission. He is denying depression. His affect is improved but remains vaguely constricted. No thought disorder, no SI , no HI, no psychotic symptoms, and future oriented . No ongoing WDL symptoms. Has had Some mild myalgias on legs , which may represent some lingering, mild opiate WDL or possibly side effect from Trazodone ( which is being stopped prior to discharge)   Consults:  None  Significant Diagnostic Studies:  None  Discharge Vitals:   Blood pressure 105/70, pulse 96, temperature 97.7 F (36.5 C), temperature source Oral, resp. rate 18, height 5\' 11"  (1.803 m), weight 62.143 kg (137 lb), SpO2 99 %. Body mass index is 19.12 kg/(m^2). Lab Results:   No results found for this or any previous visit (from the past 72 hour(s)).  Physical Findings: AIMS: Facial and Oral Movements Muscles of  Facial Expression: None, normal Lips and Perioral Area: None, normal Jaw: None, normal Tongue: None, normal,Extremity Movements Upper (arms, wrists, hands, fingers): None, normal Lower (legs, knees, ankles, toes): None, normal, Trunk  Movements Neck, shoulders, hips: None, normal, Overall Severity Severity of abnormal movements (highest score from questions above): None, normal Incapacitation due to abnormal movements: None, normal Patient's awareness of abnormal movements (rate only patient's report): No Awareness, Dental Status Current problems with teeth and/or dentures?: No Does patient usually wear dentures?: No  CIWA:  CIWA-Ar Total: 1 COWS:  COWS Total Score: 2  Psychiatric Specialty Exam: See Psychiatric Specialty Exam and Suicide Risk Assessment completed by Attending Physician prior to discharge.  Discharge destination:  Home  Is patient on multiple antipsychotic therapies at discharge:  No   Has Patient had three or more failed trials of antipsychotic monotherapy by history:  No  Recommended Plan for Multiple Antipsychotic Therapies: NA     Medication List    TAKE these medications      Indication   albuterol 108 (90 BASE) MCG/ACT inhaler  Commonly known as:  PROVENTIL HFA;VENTOLIN HFA  Inhale 2 puffs into the lungs every 4 (four) hours as needed for wheezing or shortness of breath.      citalopram 20 MG tablet  Commonly known as:  CELEXA  Take 1 tablet (20 mg total) by mouth daily.   Indication:  Depression     traZODone 50 MG tablet  Commonly known as:  DESYREL  Take 1 tablet (50 mg total) by mouth at bedtime and may repeat dose one time if needed.   Indication:  Trouble Sleeping           Follow-up Information    Follow up with [facility] On 10/19/2014.   Why:  Please present to [facility] on Friday 10/19/14 between 11 am and 9 pm. Please call if you are unable to make appointment.   Contact information:   [address], [place], [place] [id number] [phone]      Follow-up recommendations:  Activity:  As tolerated Diet:  heart healthy with low sodium.  Comments:   Take all medications as prescribed. Keep all follow-up appointments as scheduled.  Do not consume  alcohol or use illegal drugs while on prescription medications. Report any adverse effects from your medications to your primary care provider promptly.  In the event of recurrent symptoms or worsening symptoms, call 911, a crisis hotline, or go to the nearest emergency department for evaluation.   Total Discharge Time:  Greater than 30 minutes.  Signed: Beau FannyWithrow, John C, FNP-BC 10/19/2014, 1:04 PM   Patient seen, Suicide Assessment Completed.  Disposition Plan Reviewed

## 2014-10-19 NOTE — Clinical Social Work Note (Signed)
LATE NOTE FROM 10/18/14 at 10:30 am:  CSW spoke with [facility] admissions staff Jan to confirm patient's acceptance for Friday 10/19/14.  Samuella BruinKristin Mitchelle Goerner, MSW, [employer]CSWA Clinical Social Worker [facility] [phone]

## 2014-10-19 NOTE — B[facility] Group Notes (Signed)
BHH LCSW Group Therapy 10/19/2014  1:15 PM   Type of Therapy: Group Therapy  Participation Level: Did Not Attend- patient asleep.   Samuella BruinKristin Karna Abed, MSW, [employer]CSWA Clinical Social Worker [facility] [phone]

## 2014-10-19 NOTE — Tx Team (Signed)
Interdisciplinary Treatment Plan Update (Adult) Date: 10/19/2014   Time Reviewed: 9:30 AM  Progress in Treatment: Attending groups: Minimally Participating in groups: Yes, if he attends Taking medication as prescribed: Yes Tolerating medication: Yes Family/Significant other contact made: Yes, CSW has spoken with patient's mother Marigene EhlersBetty Lee Patient understands diagnosis: Yes Discussing patient identified problems/goals with staff: Yes Medical problems stabilized or resolved: Yes Denies suicidal/homicidal ideation: Yes, denies Issues/concerns per patient self-inventory: Yes Other:  New problem(s) identified: N/A  Discharge Plan or Barriers: Discharge anticipated for today 11/13 to [facility]. Patient's mother to transport.  Reason for Continuation of Hospitalization:  Depression Anxiety Medication Stabilization   Comments: N/A  Estimated length of stay: Discharge anticipated for today 10/19/14.  For review of initial/current patient goals, please see plan of care. 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 [place] lives in [place] with his girlfriend and her 237 year old daughter. Patient reports experiencing SI at time of admission and is requesting residential treatment from prescription opiate and alcohol use. Patient will benefit from crisis stabilization, medication evaluation, group therapy, and psycho education in addition to case management for discharge planning. Patient and CSW reviewed pt's identified goals and treatment plan. Pt verbalized understanding and agreed to treatment plan.   Attendees: Patient:    Family:    Physician: Dr. Jama Flavorsobos 10/19/2014 9:30 AM  Nursing: Earl ManySara Twyman, Quintella ReichertBeverly Knight, RN 10/19/2014 9:30 AM  Clinical Social Worker: Samuella BruinKristin Keitra Carusone, LCSWA 10/19/2014 9:30 AM  Other: Juline PatchQuylle  Hodnett, LCSW 10/19/2014 9:30 AM  Other: Leisa LenzValerie Enoch, Vesta MixerMonarch Liaison 10/19/2014 9:30 AM  Other: Onnie BoerJennifer Clark, Case Manager 10/19/2014 9:30 AM  Other: Tomasita Morrowelora Sutton, P4CC 10/19/2014 9:30 AM  Other: Santa GeneraAnne Cunningham, LCSW 10/19/2014 9:30 AM  Other:    Other:    Other:    Other:     Scribe for Treatment Team:  Samuella BruinKristin Tyjah Hai, MSW, [employer]CSWA [phone]

## 2014-10-19 NOTE — Progress Notes (Signed)
Discharge Note:  Patient discharged with parents.  Denied SI and HI.  Denied A/V hallucinations.  Denied pain.  Suicide prevention information given and discussed with patient who stated he understood and had no questions.  Patient stated he received all his belongings, clothing, toiletries, misc items, prescriptions, medications.  Patient stated he appreciated all assistance [facility].

## 2014-10-19 NOTE — [facility] Suicide Risk Assessment (Signed)
Demographic Factors:  28 year ols man, living with girlfriend, one child, employed   Total Time spent with patient: 30 minutes  Psychiatric Specialty Exam: Physical Exam  ROS  Blood pressure 105/70, pulse 96, temperature 97.7 F (36.5 C), temperature source Oral, resp. rate 18, height 5\' 11"  (1.803 m), weight 62.143 kg (137 lb), SpO2 99 %.Body mass index is 19.12 kg/(m^2).  General Appearance: improved grooming  Eye Contact::  Good  Speech:  Normal Rate  Volume:  Normal  Mood:  denies any depression  Affect:  improved, slightly constricted, but reactive  Thought Process:  Goal Directed and Linear  Orientation:  Full (Time, Place, and Person)  Thought Content:  denies hallucinations, no delusions  Suicidal Thoughts:  No  Homicidal Thoughts:  No  Memory:  Recent and remote grossly intact   Judgement:  Other:  improved  Insight:  Present  Psychomotor Activity:  Normal  Concentration:  Good  Recall:  Good  Fund of Knowledge:Good  Language: Good  Akathisia:  Negative  Handed:  Right  AIMS (if indicated):     Assets:  Desire for Improvement Physical Health Resilience Social Support  Sleep:  Number of Hours: 6.5    Musculoskeletal: Strength & Muscle Tone: within normal limits Gait & Station: normal Patient leans: N/A   Mental Status Per Nursing Assessment::   On Admission:  NA (denies)  Current Mental Status by Physician: Patient is improved compared to admission. He is denying depression. His affect is improved but remains vaguely constricted. No thought disorder, no SI , no HI, no psychotic symptoms, and future oriented . No ongoing WDL symptoms. Has had  Some mild myalgias on legs , which may represent some lingering, mild opiate WDL or possibly side effect from Trazodone ( which is being stopped prior to discharge)   Loss Factors: Strained relationship with family and girlfriend in the context of drug use   Historical Factors: History of Opiate Dependence,no  history of suicidal attempts, no prior psychiatric admissions  Risk Reduction Factors:   Responsible for children under 27 years of age, Sense of responsibility to family, Employed, Positive social support and Positive coping skills or problem solving skills  Continued Clinical Symptoms:  As above, improved upon discharge. Discharging in good spirits. [place] Mother will transport him there.  Cognitive Features That Contribute To Risk:  No gross cognitive deficits noted upon discharge. Is alert , attentive, and oriented x 3   Suicide Risk:  Mild:  Suicidal ideation of limited frequency, intensity, duration, and specificity.  There are no identifiable plans, no associated intent, mild dysphoria and related symptoms, good self-control (both objective and subjective assessment), few other risk factors, and identifiable protective factors, including available and accessible social support.  Discharge Diagnoses:   AXIS I:  Opiate Dependence, Opiate Withdrawal ( improved), Opiate Induced Mood Disorder, Depressed ( improved) AXIS II:  Deferred AXIS III:   Past Medical History  Diagnosis Date  . Allergy   . Asthma   . Pulmonary tuberculosis    AXIS IV:  other psychosocial or environmental problems AXIS V: 65-70 upon discharge  Plan Of Care/Follow-up recommendations:  Activity:  As tolerated Diet:  Regular Tests:  NA Other:  See below  Is patient on multiple antipsychotic therapies at discharge:  No   Has Patient had three or more failed trials of antipsychotic monotherapy by history:  No  Recommended Plan for Multiple Antipsychotic Therapies: NA  Patient is  discharging in good spirits. [facility] [facility]   Nehemiah MassedCOBOS, FERNANDO 10/19/2014, 11:03 AM

## 2014-10-23 NOTE — Progress Notes (Signed)
Patient Discharge Instructions:  After Visit Summary (AVS):   Faxed to:  10/23/14 Discharge Summary Note:   Faxed to:  10/23/14 Psychiatric Admission Assessment Note:   Faxed to:  10/23/14 Suicide Risk Assessment - Discharge Assessment:   Faxed to:  10/23/14 Faxed/Sent to the Next Level Care provider:  10/23/14 Faxed to [facility] [place] @ [phone]  Jerelene ReddenSheena E Candelaria, 10/23/2014, 3:56 PM

## 2015-02-12 ENCOUNTER — Encounter (HOSPITAL_COMMUNITY): Payer: Self-pay

## 2015-02-12 ENCOUNTER — Inpatient Hospital Stay (HOSPITAL_COMMUNITY)
Admission: AD | Admit: 2015-02-12 | Discharge: 2015-02-15 | DRG: 897 | Disposition: A | Discharge status: Home / Self Care | Payer: Federal, State, Local not specified - Other | Source: Intra-hospital | Attending: Psychiatry | Admitting: Psychiatry

## 2015-02-12 ENCOUNTER — Emergency Department (HOSPITAL_COMMUNITY)
Admission: EM | Admit: 2015-02-12 | Discharge: 2015-02-12 | Disposition: A | Discharge status: Other Acute Inpatient Hospital | Payer: Self-pay | Attending: Emergency Medicine | Admitting: Emergency Medicine

## 2015-02-12 ENCOUNTER — Encounter (HOSPITAL_COMMUNITY): Payer: Self-pay | Admitting: Behavioral Health

## 2015-02-12 DIAGNOSIS — J45909 Unspecified asthma, uncomplicated: Secondary | ICD-10-CM | POA: Diagnosis present

## 2015-02-12 DIAGNOSIS — F329 Major depressive disorder, single episode, unspecified: Secondary | ICD-10-CM | POA: Diagnosis present

## 2015-02-12 DIAGNOSIS — F1924 Other psychoactive substance dependence with psychoactive substance-induced mood disorder: Secondary | ICD-10-CM

## 2015-02-12 DIAGNOSIS — F1020 Alcohol dependence, uncomplicated: Secondary | ICD-10-CM | POA: Diagnosis present

## 2015-02-12 DIAGNOSIS — E119 Type 2 diabetes mellitus without complications: Secondary | ICD-10-CM | POA: Diagnosis present

## 2015-02-12 DIAGNOSIS — G4700 Insomnia, unspecified: Secondary | ICD-10-CM | POA: Diagnosis present

## 2015-02-12 DIAGNOSIS — F10230 Alcohol dependence with withdrawal, uncomplicated: Secondary | ICD-10-CM | POA: Diagnosis present

## 2015-02-12 DIAGNOSIS — F1420 Cocaine dependence, uncomplicated: Secondary | ICD-10-CM | POA: Diagnosis present

## 2015-02-12 DIAGNOSIS — F410 Panic disorder [episodic paroxysmal anxiety] without agoraphobia: Secondary | ICD-10-CM | POA: Diagnosis present

## 2015-02-12 DIAGNOSIS — F1123 Opioid dependence with withdrawal: Secondary | ICD-10-CM | POA: Diagnosis present

## 2015-02-12 DIAGNOSIS — F10239 Alcohol dependence with withdrawal, unspecified: Secondary | ICD-10-CM | POA: Diagnosis present

## 2015-02-12 DIAGNOSIS — F1120 Opioid dependence, uncomplicated: Secondary | ICD-10-CM | POA: Diagnosis present

## 2015-02-12 DIAGNOSIS — F1423 Cocaine dependence with withdrawal: Secondary | ICD-10-CM | POA: Diagnosis present

## 2015-02-12 DIAGNOSIS — Y900 Blood alcohol level of less than 20 mg/100 ml: Secondary | ICD-10-CM | POA: Diagnosis present

## 2015-02-12 DIAGNOSIS — F17210 Nicotine dependence, cigarettes, uncomplicated: Secondary | ICD-10-CM | POA: Diagnosis present

## 2015-02-12 DIAGNOSIS — F10930 Alcohol use, unspecified with withdrawal, uncomplicated: Secondary | ICD-10-CM | POA: Diagnosis present

## 2015-02-12 DIAGNOSIS — R45851 Suicidal ideations: Secondary | ICD-10-CM | POA: Insufficient documentation

## 2015-02-12 DIAGNOSIS — F1193 Opioid use, unspecified with withdrawal: Secondary | ICD-10-CM | POA: Diagnosis present

## 2015-02-12 DIAGNOSIS — F19239 Other psychoactive substance dependence with withdrawal, unspecified: Secondary | ICD-10-CM | POA: Diagnosis present

## 2015-02-12 DIAGNOSIS — F1220 Cannabis dependence, uncomplicated: Secondary | ICD-10-CM | POA: Diagnosis present

## 2015-02-12 DIAGNOSIS — F3289 Other specified depressive episodes: Secondary | ICD-10-CM

## 2015-02-12 DIAGNOSIS — F1994 Other psychoactive substance use, unspecified with psychoactive substance-induced mood disorder: Secondary | ICD-10-CM | POA: Diagnosis present

## 2015-02-12 LAB — ACETAMINOPHEN LEVEL: Acetaminophen (Tylenol), Serum: <10 ug/mL — ABNORMAL LOW (ref 10–30)

## 2015-02-12 LAB — SALICYLATE LEVEL

## 2015-02-12 LAB — COMPREHENSIVE METABOLIC PANEL
ALK PHOS: 65 U/L (ref 39–117)
ALT: 21 U/L (ref 0–53)
AST: 19 U/L (ref 0–37)
Albumin: 3.7 g/dL (ref 3.5–5.2)
Anion gap: 6 (ref 5–15)
BUN: 13 mg/dL (ref 6–23)
CO2: 29 mmol/L (ref 19–32)
CREATININE: 1 mg/dL (ref 0.50–1.35)
Calcium: 9.6 mg/dL (ref 8.4–10.5)
Chloride: 105 mmol/L (ref 96–112)
GFR calc non Af Amer: >90 mL/min (ref >90)
Glucose, Bld: 77 mg/dL (ref 70–99)
Potassium: 4.5 mmol/L (ref 3.5–5.1)
Sodium: 140 mmol/L (ref 135–145)
TOTAL PROTEIN: 7 g/dL (ref 6.0–8.3)
Total Bilirubin: 0.5 mg/dL (ref 0.3–1.2)

## 2015-02-12 LAB — RAPID URINE DRUG SCREEN, HOSP PERFORMED
Amphetamines: NOT DETECTED
BARBITURATES: NOT DETECTED
BENZODIAZEPINES: NOT DETECTED
COCAINE: POSITIVE — AB
OPIATES: NOT DETECTED
Tetrahydrocannabinol: POSITIVE — AB

## 2015-02-12 LAB — CBC
HEMATOCRIT: 40.7 % (ref 39.0–52.0)
HEMOGLOBIN: 14.7 g/dL (ref 13.0–17.0)
MCH: 27.5 pg (ref 26.0–34.0)
MCHC: 36.1 g/dL — ABNORMAL HIGH (ref 30.0–36.0)
MCV: 76.2 fL — ABNORMAL LOW (ref 78.0–100.0)
PLATELETS: 391 10*3/uL (ref 150–400)
RBC: 5.34 MIL/uL (ref 4.22–5.81)
RDW: 13.5 % (ref 11.5–15.5)
WBC: 13 10*3/uL — ABNORMAL HIGH (ref 4.0–10.5)

## 2015-02-12 LAB — ETHANOL: Alcohol, Ethyl (B): 6 mg/dL (ref 0–9)

## 2015-02-12 MED ORDER — LOPERAMIDE HCL 2 MG PO CAPS: 2.0000 mg | ORAL_CAPSULE | ORAL | Status: DC | PRN

## 2015-02-12 MED ORDER — CHLORDIAZEPOXIDE HCL 25 MG PO CAPS
25.0000 mg | ORAL_CAPSULE | Freq: Four times a day (QID) | ORAL | Status: DC | PRN
  Administered 2015-02-14: 25 mg via ORAL
  Filled 2015-02-12: qty 1

## 2015-02-12 MED ORDER — ALBUTEROL SULFATE HFA 108 (90 BASE) MCG/ACT IN AERS: 2.0000 | INHALATION_SPRAY | RESPIRATORY_TRACT | Status: DC | PRN

## 2015-02-12 MED ORDER — VITAMIN B-1 100 MG PO TABS
100.0000 mg | ORAL_TABLET | Freq: Every day | ORAL | Status: DC
Start: 2015-02-13 — End: 2015-02-15
  Administered 2015-02-13 – 2015-02-14 (×2): 100 mg via ORAL
  Filled 2015-02-12 (×6): qty 1

## 2015-02-12 MED ORDER — MAGNESIUM HYDROXIDE 400 MG/5ML PO SUSP: 30.0000 mL | Freq: Every day | ORAL | Status: DC | PRN

## 2015-02-12 MED ORDER — TRAZODONE HCL 50 MG PO TABS
50.0000 mg | ORAL_TABLET | Freq: Every evening | ORAL | Status: DC | PRN
  Administered 2015-02-12 – 2015-02-13 (×2): 50 mg via ORAL
  Filled 2015-02-12 (×4): qty 1
  Filled 2015-02-12: qty 28
  Filled 2015-02-12 (×4): qty 1
  Filled 2015-02-12: qty 28

## 2015-02-12 MED ORDER — ALUM & MAG HYDROXIDE-SIMETH 200-200-20 MG/5ML PO SUSP: 30.0000 mL | ORAL | Status: DC | PRN

## 2015-02-12 MED ORDER — THIAMINE HCL 100 MG/ML IJ SOLN: 100.0000 mg | Freq: Once | INTRAMUSCULAR | Status: DC

## 2015-02-12 MED ORDER — CITALOPRAM HYDROBROMIDE 20 MG PO TABS
20.0000 mg | ORAL_TABLET | Freq: Every day | ORAL | Status: DC
  Administered 2015-02-13 – 2015-02-14 (×2): 20 mg via ORAL
  Filled 2015-02-12: qty 14
  Filled 2015-02-12 (×5): qty 1

## 2015-02-12 MED ORDER — METHOCARBAMOL 500 MG PO TABS: 500.0000 mg | ORAL_TABLET | Freq: Three times a day (TID) | ORAL | Status: DC | PRN

## 2015-02-12 MED ORDER — ADULT MULTIVITAMIN W/MINERALS CH
1.0000 | ORAL_TABLET | Freq: Every day | ORAL | Status: DC
  Administered 2015-02-13 – 2015-02-14 (×2): 1 via ORAL
  Filled 2015-02-12 (×6): qty 1

## 2015-02-12 MED ORDER — DICYCLOMINE HCL 20 MG PO TABS: 20.0000 mg | ORAL_TABLET | Freq: Four times a day (QID) | ORAL | Status: DC | PRN

## 2015-02-12 MED ORDER — CLONIDINE HCL 0.1 MG PO TABS
0.1000 mg | ORAL_TABLET | ORAL | Status: DC
  Filled 2015-02-12 (×4): qty 1

## 2015-02-12 MED ORDER — CLONIDINE HCL 0.1 MG PO TABS
0.1000 mg | ORAL_TABLET | Freq: Four times a day (QID) | ORAL | Status: AC
  Administered 2015-02-12 – 2015-02-13 (×3): 0.1 mg via ORAL
  Filled 2015-02-12 (×12): qty 1

## 2015-02-12 MED ORDER — HYDROXYZINE HCL 25 MG PO TABS
25.0000 mg | ORAL_TABLET | Freq: Four times a day (QID) | ORAL | Status: DC | PRN
  Administered 2015-02-14: 25 mg via ORAL
  Filled 2015-02-12: qty 1

## 2015-02-12 MED ORDER — ONDANSETRON 4 MG PO TBDP: 4.0000 mg | ORAL_TABLET | Freq: Four times a day (QID) | ORAL | Status: DC | PRN

## 2015-02-12 MED ORDER — ACETAMINOPHEN 325 MG PO TABS: 650.0000 mg | ORAL_TABLET | Freq: Four times a day (QID) | ORAL | Status: DC | PRN

## 2015-02-12 MED ORDER — CLONIDINE HCL 0.1 MG PO TABS: 0.1000 mg | ORAL_TABLET | Freq: Every day | ORAL | Status: DC

## 2015-02-12 MED ORDER — NAPROXEN 500 MG PO TABS: 500.0000 mg | ORAL_TABLET | Freq: Two times a day (BID) | ORAL | Status: DC | PRN

## 2015-02-12 MED ORDER — NICOTINE 21 MG/24HR TD PT24: 21.0000 mg | MEDICATED_PATCH | Freq: Every day | TRANSDERMAL | Status: DC

## 2015-02-12 NOTE — Progress Notes (Addendum)
Per Tanna SavoyEric AC, pt accepted to Dr Dub MikesLugo to [phone].Pt can come after 7pm today.  [place] Abtsatia Seger Jani, LCSWA Disposition staff 02/12/2015 4:05 PM

## 2015-02-12 NOTE — ED Notes (Addendum)
Patient reports he has been using about 100 mg of oxy daily for the past 2.5 months. States he has also been using cocaine and molly and etoh. States he is si because of his "substance abuse issues".  States he last used oxy on Saturday. States he has been using cocaine and molly and alcohol since Saturday. States he did drink some beer this morning. States he was on celexa when he left [facility] in November but he quit taking it shortly after leaving [place].  States his family and girlfriend are aware he is here.

## 2015-02-12 NOTE — ED Notes (Signed)
Pt states he is having thoughts about wanting to hurt himself. He denies having a plan. Reports drinking alcohol today but not doing any drugs

## 2015-02-12 NOTE — ED Notes (Signed)
Tele-Psych computer at bedside.

## 2015-02-12 NOTE — ED Provider Notes (Signed)
CSN: [id number]     Arrival date & time 02/12/15  1037 History   None    Chief Complaint  Patient presents with  . Suicidal     (Consider location/radiation/quality/duration/timing/severity/associated sxs/prior Treatment) HPI Patient feeling suicidal over the past 3-4 days. He is depressed over his sepsis abuse problem. Patient has been using opioid pain medicine. He denies IV drug use. He did drink one or 2 alcoholic beverages today. He is presently asymptomatic other than feeling depressed. No plan for suicide methodology Past Medical History  Diagnosis Date  . Allergy   . Asthma   . Pulmonary tuberculosis    Past Surgical History  Procedure Laterality Date  . Tonsillectomy     History reviewed. No pertinent family history. History  Substance Use Topics  . Smoking status: Current Every Day Smoker -- 0.50 packs/day    Types: Cigarettes  . Smokeless tobacco: Never Used     Comment: 4 ciggs qd  . Alcohol Use: No    Review of Systems  Psychiatric/Behavioral: Positive for dysphoric mood.  All other systems reviewed and are negative.     Allergies  Cephalexin  Home Medications   Prior to Admission medications   Medication Sig Start Date End Date Taking? Authorizing Provider  albuterol (PROVENTIL HFA;VENTOLIN HFA) 108 (90 BASE) MCG/ACT inhaler Inhale 2 puffs into the lungs every 4 (four) hours as needed for wheezing or shortness of breath. 02/03/13   Nelwyn SalisburyStephen A Fry, MD  citalopram (CELEXA) 20 MG tablet Take 1 tablet (20 mg total) by mouth daily. 10/18/14   Thermon LeylandLaura A Davis, NP  traZODone (DESYREL) 50 MG tablet Take 1 tablet (50 mg total) by mouth at bedtime and may repeat dose one time if needed. 10/18/14   Thermon LeylandLaura A Davis, NP   BP 116/60 mmHg  Pulse 91  Temp(Src) 98.5 F (36.9 C) (Oral)  Resp 18  SpO2 100% Physical Exam  Constitutional: He is oriented to person, place, and time. He appears well-developed and well-nourished.  HENT:  Head: Normocephalic and atraumatic.   Eyes: Conjunctivae are normal. Pupils are equal, round, and reactive to light.  Neck: Neck supple. No tracheal deviation present. No thyromegaly present.  Cardiovascular: Normal rate and regular rhythm.   No murmur heard. Pulmonary/Chest: Effort normal and breath sounds normal.  Abdominal: Soft. Bowel sounds are normal. He exhibits no distension. There is no tenderness.  Musculoskeletal: Normal range of motion. He exhibits no edema or tenderness.  Neurological: He is alert and oriented to person, place, and time. No cranial nerve deficit. Coordination normal.  Gait normal. Not lightheaded on standing  Skin: Skin is warm and dry. No rash noted.  Psychiatric: He has a normal mood and affect.  Nursing note and vitals reviewed.   ED Course  Procedures (including critical care time) Labs Review Labs Reviewed  ACETAMINOPHEN LEVEL  CBC  COMPREHENSIVE METABOLIC PANEL  ETHANOL  SALICYLATE LEVEL  URINE RAPID DRUG SCREEN (HOSP PERFORMED)    Imaging Review No results found.   EKG Interpretation None     Results for orders placed or performed during the hospital encounter of 02/12/15  Acetaminophen level  Result Value Ref Range   Acetaminophen (Tylenol), Serum <10.0 (L) 10 - 30 ug/mL  CBC  Result Value Ref Range   WBC 13.0 (H) 4.0 - 10.5 K/uL   RBC 5.34 4.22 - 5.81 MIL/uL   Hemoglobin 14.7 13.0 - 17.0 g/dL   HCT 78.240.7 95.639.0 - 21.352.0 %   MCV 76.2 (L) 78.0 -  100.0 fL   MCH 27.5 26.0 - 34.0 pg   MCHC 36.1 (H) 30.0 - 36.0 g/dL   RDW 16.1 09.6 - 04.5 %   Platelets 391 150 - 400 K/uL  Comprehensive metabolic panel  Result Value Ref Range   Sodium 140 135 - 145 mmol/L   Potassium 4.5 3.5 - 5.1 mmol/L   Chloride 105 96 - 112 mmol/L   CO2 29 19 - 32 mmol/L   Glucose, Bld 77 70 - 99 mg/dL   BUN 13 6 - 23 mg/dL   Creatinine, Ser 4.09 0.50 - 1.35 mg/dL   Calcium 9.6 8.4 - 81.1 mg/dL   Total Protein 7.0 6.0 - 8.3 g/dL   Albumin 3.7 3.5 - 5.2 g/dL   AST 19 0 - 37 U/L   ALT 21 0 - 53  U/L   Alkaline Phosphatase 65 39 - 117 U/L   Total Bilirubin 0.5 0.3 - 1.2 mg/dL   GFR calc non Af Amer >90 >90 mL/min   GFR calc Af Amer >90 >90 mL/min   Anion gap 6 5 - 15  Ethanol (ETOH)  Result Value Ref Range   Alcohol, Ethyl (B) 6 0 - 9 mg/dL  Salicylate level  Result Value Ref Range   Salicylate Lvl <4.0 2.8 - 20.0 mg/dL  Urine Drug Screen  Result Value Ref Range   Opiates NONE DETECTED NONE DETECTED   Cocaine POSITIVE (A) NONE DETECTED   Benzodiazepines NONE DETECTED NONE DETECTED   Amphetamines NONE DETECTED NONE DETECTED   Tetrahydrocannabinol POSITIVE (A) NONE DETECTED   Barbiturates NONE DETECTED NONE DETECTED   No results found.  MDM  TTS called for consultation for inpatient psychiatric stay Diagnosis #1 suicidal ideation #2 polysubstance abuse Final diagnoses:  None        Doug Sou, MD 02/12/15 [phone]

## 2015-02-12 NOTE — ED Notes (Signed)
[facility] contacted.  

## 2015-02-12 NOTE — BH Assessment (Signed)
Writer informed Molinda BailiffYYS Amanda of the consult.

## 2015-02-12 NOTE — BH Assessment (Addendum)
Tele Assessment Note   Dalton [name] is an 29 y.o. male who presents with increasing suicidal ideation with multiple plans.  Dalton [name] reports that he has had worsening opiate abuse over the last 6 years.  He has also been drinking more and now drinks at least 24 oz of beer daily and sometimes 4 24 oz beers.  He also reports that over the last year he's been using cocaine more often and last used on Saturday and Sunday.  He reports his last use of opiates (usually percocet and/or vicodin) was on Sunday and that he typically uses 70-100 mg daily.  He reports he last drank a 24 oz beer today.  Dalton [name] reports he compelted a 30 day program at [facility] [place], but relapsed about a month later and is feeling very hopeless about his future.  He reports that he knows if he can't get his drug use under control, that he'll end up homeless so he might as well kill himself now.  He states when he drives he considers driving into a wall or off a bridge and that since he's been in the ED, he's noticed ways he could end his life.  He reports they come to his attention constantly over the last week or so.  He lives with his girlfriend and her 73 year old daughter and reports that his substance abuse has caused him to lose his job, caused financial difficulty, made him steal from [employer] to get extra money so now he has larceny charges, and has destroyed his personal relationships.  He denies HI and AVH and endorses depression including feelings of worthlessness, isolating behavior, anger/irritability, fatigue, insomnia, decreased appetite, and anhedonia.  PEr Dalton [name], [facility] NP, pt is appropriate for inpatient treatment.    Axis I: Substance Abuse and Substance Induced Mood Disorder Axis II: Deferred Axis III:  Past Medical History  Diagnosis Date  . Allergy   . Asthma   . Pulmonary tuberculosis    Axis IV: economic problems and problems with access to health care services Axis V: 41-50 serious  symptoms  Past Medical History:  Past Medical History  Diagnosis Date  . Allergy   . Asthma   . Pulmonary tuberculosis     Past Surgical History  Procedure Laterality Date  . Tonsillectomy      Family History: History reviewed. No pertinent family history.  Social History:  reports that he has been smoking Cigarettes.  He has been smoking about 0.50 packs per day. He has never used smokeless tobacco. He reports that he does not drink alcohol or use illicit drugs.  Additional Social History:  Alcohol / Drug Use History of alcohol / drug use?: Yes Longest period of sobriety (when/how long): 1.5 mos Withdrawal Symptoms: Irritability, Fever / Chills Substance #1 Name of Substance 1: Pain Medication-Percocet and/or vicodin 1 - Age of First Use: 23 1 - Amount (size/oz): 70-100 mg  1 - Frequency: daily 1 - Duration: 5-6 years 1 - Last Use / Amount: 02/10/15 Substance #2 Name of Substance 2: Alcohol 2 - Age of First Use: 15 2 - Amount (size/oz): 24 oz beer-4 beers  2 - Frequency: daily 2 - Duration: 1 year 2 - Last Use / Amount: 02/12/15 24 oz beer Substance #3 Name of Substance 3: Cocaine-snorting 3 - Age of First Use: 28 3 - Amount (size/oz): $20 3 - Frequency: 6-7 times this year 3 - Last Use / Amount: 02/10/15  CIWA: CIWA-Ar BP: 104/56  mmHg Pulse Rate: 63 COWS:    PATIENT STRENGTHS: (choose at least two) Ability for insight Supportive family/friends Work skills  Allergies:  Allergies  Allergen Reactions  . Cephalexin     REACTION: nausea, vomiting    Home Medications:  (Not in a hospital admission)  OB/GYN Status:  No LMP for male patient.  General Assessment Data Is this a Tele or Face-to-Face Assessment?: Tele Assessment Is this an Initial Assessment or a Re-assessment for this encounter?: Initial Assessment Living Arrangements: Spouse/significant other (girlfriend and her 28 years old) Can pt return to current living arrangement?: Yes Admission Status:  Voluntary Is patient capable of signing voluntary admission?: Yes Transfer from: Home Referral Source: Self/Family/Friend     [facility] Living Arrangements: Spouse/significant other (girlfriend and her 28 years old)  Education Status Is patient currently in school?: No Highest grade of school patient has completed: Associates Degree in Social Work  Risk to self with the past 6 months Suicidal Ideation: Yes-Currently Present Suicidal Intent: No-Not Currently/Within Last 6 Months Is patient at risk for suicide?: Yes Suicidal Plan?: Yes-Currently Present (slam into a wall on high way while driving) Specify Current Suicidal Plan: impulses to drive into the wall, to use the cords in the ED room Access to Means: Yes Specify Access to Suicidal Means: environmental What has been your use of drugs/alcohol within the last 12 months?: ongoing Previous Attempts/Gestures: No Intentional Self Injurious Behavior: None Family Suicide History: No Recent stressful life event(s): Turmoil (Comment) (substance abuse) Persecutory voices/beliefs?: No Depression: Yes Depression Symptoms: Feeling worthless/self pity, Guilt, Feeling angry/irritable, Loss of interest in usual pleasures, Isolating, Insomnia, Fatigue Substance abuse history and/or treatment for substance abuse?: Yes Suicide prevention information given to non-admitted patients: Not applicable  Risk to Others within the past 6 months Homicidal Ideation: No Thoughts of Harm to Others: No Current Homicidal Intent: No Current Homicidal Plan: No Access to Homicidal Means: No History of harm to others?: No Assessment of Violence: None Noted Does patient have access to weapons?: No Criminal Charges Pending?: Yes Describe Pending Criminal Charges: larceny Does patient have a court date: Yes Court Date: 02/14/15  Psychosis Hallucinations: None noted Delusions: None noted  Mental Status Report Appear/Hygiene: Unremarkable, In  scrubs Eye Contact: Good Motor Activity: Freedom of movement Speech: Logical/coherent Level of Consciousness: Alert Mood: Depressed Affect: Appropriate to circumstance Anxiety Level: None Thought Processes: Coherent, Relevant Judgement: Impaired Orientation: Person, Place, Time, Situation Obsessive Compulsive Thoughts/Behaviors: Minimal  Cognitive Functioning Concentration: Normal Memory: Remote Intact, Recent Intact IQ: Average Insight: Good Impulse Control: Fair Appetite: Fair Weight Loss: 10 Weight Gain: 0 Sleep: Decreased Total Hours of Sleep: 3 Vegetative Symptoms: Staying in bed, Not bathing  ADLScreening [facility] Assessment Services) Patient's cognitive ability adequate to safely complete daily activities?: Yes Patient able to express need for assistance with ADLs?: Yes Independently performs ADLs?: Yes (appropriate for developmental age)  Prior Inpatient Therapy Prior Inpatient Therapy: Yes Prior Therapy Dates: December 2015 Prior [facility]ilty[facility]Provider[facility] [place] Reason for Treatment: SA  Prior Outpatient Therapy Prior Outpatient Therapy: No  ADL Screening (condition at time of admission) Patient's cognitive ability adequate to safely complete daily activities?: Yes Patient able to express need for assistance with ADLs?: Yes Independently performs ADLs?: Yes (appropriate for developmental age)       Abuse/Neglect Assessment (Assessment to be complete while patient is alone) Physical Abuse: Denies Verbal Abuse: Denies Sexual Abuse: Denies Exploitation of patient/patient's resources: Denies Self-Neglect: Denies     Advance  Directives (For Healthcare) Does patient have an advance directive?: No Would patient like information on creating an advanced directive?: No - patient declined information    Additional Information 1:1 In Past 12 Months?: No CIRT Risk: No Elopement Risk: No Does patient have medical clearance?: Yes      Disposition: Inpatient recommended per Dalton [name] Disposition Initial Assessment Completed for this Encounter: Yes Disposition of Patient: Inpatient treatment program Type of inpatient treatment program: Adult  Steward Ros 02/12/2015 1:47 PM

## 2015-02-12 NOTE — ED Notes (Signed)
Patient unable to void at this time

## 2015-02-12 NOTE — ED Notes (Signed)
Belongings and valuables inventoried and stored.

## 2015-02-12 NOTE — ED Notes (Addendum)
PATIENT HAS SIGNED CONSENT FOR ADMIT TO [facility]. FAXED TO [facility]. HE IS AGREEABLE WITH TRANSFER. STATES HE WILL CALL HIS MOTHER AND UPDATE HER ON HIS ADMISSION

## 2015-02-13 ENCOUNTER — Encounter (HOSPITAL_COMMUNITY): Payer: Self-pay | Admitting: Psychiatry

## 2015-02-13 DIAGNOSIS — F10230 Alcohol dependence with withdrawal, uncomplicated: Secondary | ICD-10-CM

## 2015-02-13 DIAGNOSIS — F1220 Cannabis dependence, uncomplicated: Secondary | ICD-10-CM | POA: Diagnosis present

## 2015-02-13 DIAGNOSIS — F1193 Opioid use, unspecified with withdrawal: Secondary | ICD-10-CM | POA: Diagnosis present

## 2015-02-13 DIAGNOSIS — F1420 Cocaine dependence, uncomplicated: Secondary | ICD-10-CM

## 2015-02-13 DIAGNOSIS — F10930 Alcohol use, unspecified with withdrawal, uncomplicated: Secondary | ICD-10-CM | POA: Diagnosis present

## 2015-02-13 DIAGNOSIS — F1994 Other psychoactive substance use, unspecified with psychoactive substance-induced mood disorder: Secondary | ICD-10-CM

## 2015-02-13 DIAGNOSIS — F1123 Opioid dependence with withdrawal: Secondary | ICD-10-CM | POA: Diagnosis present

## 2015-02-13 DIAGNOSIS — F1020 Alcohol dependence, uncomplicated: Secondary | ICD-10-CM | POA: Diagnosis present

## 2015-02-13 DIAGNOSIS — F1120 Opioid dependence, uncomplicated: Secondary | ICD-10-CM | POA: Diagnosis present

## 2015-02-13 NOTE — Progress Notes (Signed)
Initial Interdisciplinary Treatment Plan   PATIENT STRESSORS: Medication change or noncompliance Substance abuse   PATIENT STRENGTHS: Average or above average intelligence Capable of independent living General fund of knowledge   PROBLEM LIST: Problem List/Patient Goals Date to be addressed Date deferred Reason deferred Estimated date of resolution  Suicidal Ideation 02/12/2015     Substance Abuse 02/12/2015                                                DISCHARGE CRITERIA:  Improved stabilization in mood, thinking, and/or behavior Verbal commitment to aftercare and medication compliance Withdrawal symptoms are absent or subacute and managed without 24-hour nursing intervention  PRELIMINARY DISCHARGE PLAN: Attend 12-step recovery group Outpatient therapy Return to previous living arrangement Return to previous work or school arrangements  PATIENT/FAMIILY INVOLVEMENT: This treatment plan has been presented to and reviewed with the patient, Dalton [name], and/or family member.  The patient and family have been given the opportunity to ask questions and make suggestions.  Leda QuailSmith, Wenceslao Loper T 02/13/2015, 12:01 AM

## 2015-02-13 NOTE — [facility] Group Notes (Signed)
[place] LCSW Group Therapy  02/13/2015 3:20 PM  Type of Therapy:  Group Therapy  Participation Level:  Did Not Attend-pt in room/chose not to attend and also met with CSW during group time to complete PSA and discuss aftercare.   Summary of Progress/Problems: Today's Topic: Overcoming Obstacles. Pts identified obstacles faced currently and processed barriers involved in overcoming these obstacles. Pts identified steps necessary for overcoming these obstacles and explored motivation (internal and external) for facing these difficulties head on. Pts further identified one area of concern in their lives and chose a skill of focus pulled from their "toolbox."   Smart, [place] 02/13/2015, 3:20 PM

## 2015-02-13 NOTE — Progress Notes (Signed)
Pt attended NA group this evening.  

## 2015-02-13 NOTE — Tx Team (Signed)
Interdisciplinary Treatment Plan Update (Adult)   Date: 02/13/2015   Time Reviewed: 9:30AM Progress in Treatment:  Attending groups: No.  Participating in groups:  No.  Taking medication as prescribed: Yes  Tolerating medication: Yes  Family/Significant othe contact made: Not yet. SPE required for this pt.   Patient understands diagnosis: Yes, AEB seeking treatment for opiate/alcohol/cocaine abuse, depression, SI with a plan, and for medication stabilization.  Discussing patient identified problems/goals with staff: Yes  Medical problems stabilized or resolved: Yes  Denies suicidal/homicidal ideation: Yes during self report.  Patient has not harmed self or Others: Yes  New problem(s) identified:  Discharge Plan or Barriers: Pt did not attend morning d/c planning group. CSW assessing. PSA needed.  Additional comments: Dalton [name] is an 29 y.o. male who presents with increasing suicidal ideation with multiple plans. Mr [name] reports that he has had worsening opiate abuse over the last 6 years. He has also been drinking more and now drinks at least 24 oz of beer daily and sometimes 4 24 oz beers. He also reports that over the last year he's been using cocaine more often and last used on Saturday and Sunday. He reports his last use of opiates (usually percocet and/or vicodin) was on Sunday and that he typically uses 70-100 mg daily. He reports he last drank a 24 oz beer yesterday. Mr [name] reports he compelted [facility] [place], but relapsed about a month later and is feeling very hopeless about his future. He reports that he knows if he can't get his drug use under control, that he'll end up homeless so he might as well kill himself now. He states when he drives he considers driving into a wall or off a bridge and that since he's been in the ED, he's noticed ways he could end his life. He reports they come to his attention constantly over the last week or so.  He lives with his girlfriend and her 28 year old daughter and reports that his substance abuse has caused him to lose his job, caused financial difficulty, made him steal from [employer] to get extra money so now he has larceny charges, and has destroyed his personal relationships. He denies HI and AVH and endorses depression including feelings of worthlessness, isolating behavior, anger/irritability, fatigue, insomnia, decreased appetite, and anhedonia. PEr Claudette Headonrad Withrow, [facility] NP, pt is appropriate for inpatient treatment.  Reason for Continuation of Hospitalization: Clonidine taper-withdrawals Medication management SI Depression/mood instability  Estimated length of stay: 3-5 days  For review of initial/current patient goals, please see plan of care.  Attendees:  Patient:    Family:    Physician: Geoffery LyonsIrving Lugo MD 02/13/2015   Nursing: Merla RichesNoelle, Britney T. RN 02/13/2015   Clinical Social Worker Anastasia Tompson Smart, LCSWA  02/13/2015   Other: Belenda CruiseKristin D. LCSWA 02/13/2015   Other: Vikki PortsValerie; [facility] TCT   02/13/2015   Other: Liliane Badeolora Sutton, Community Care Coordinator  02/13/2015   Other:    Scribe for Treatment Team:  Herbert SetaHeather Smart LCSWA 02/13/2015 9:30AM

## 2015-02-13 NOTE — Progress Notes (Signed)
Recreation Therapy Notes  Date: 03.09.2016 Time: 9:30am Location: [address] Group Room   Group Topic: Stress Management  Goal Area(s) Addresses:  Patient will actively participate in stress management techniques presented during session.   Behavioral Response: Did not attend.   Amara Manalang L Saige [place], LRT/CTRS  Mattie Novosel L 02/13/2015 1:49 PM 

## 2015-02-13 NOTE — Progress Notes (Signed)
D: Patient denies SI/HI and A/V hallucinations; patient denies all withdrawal symptoms; patient denies pain and reports that he slept well  A: Monitored q 15 minutes; patient encouraged to attend groups; patient educated about medications; patient given medications per physician orders; patient encouraged to express feelings and/or concerns  R: Patient forwards little information; patient is cooperative; patient does not appear to be exhibiting any withdrawal symptoms at this time;patient was able to set goal to talk with staff 1:1 when having feelings of SI; patient is taking medications as prescribed and tolerating medications

## 2015-02-13 NOTE — Progress Notes (Signed)
29 year old male admitted for suicidal ideation with no plan and polysubstance abuse. Pt was last here Nov 2015. Stopped taking prescribed meds. Pt has heavy prescription opiate abuse, some alcohol use and occasional cocaine use. Pt cannot identify any particular stressor. Minimally answers questions. Denies any medical history but noted to have a past history of TB (last Chest Xray Nov 2015, normal). Consents signed and pt verbalized understanding. Skin visually assessed and belongings searched. Pt oriented to unit. No complaints of pain or discomfort at this time. Q15 min checks maintained. Will continue to monitor pt.

## 2015-02-13 NOTE — [facility] Group Notes (Signed)
[facility] LCSW Group Therapy  02/13/2015 11:07 AM  Type of Therapy:  Group Therapy  Participation Level:  Did Not Attend-invited. Pt chose to stay in bed.   Smart, Jenny Lai LCSWA  02/13/2015, 11:07 AM

## 2015-02-13 NOTE — H&P (Signed)
Psychiatric Admission Assessment Adult  Patient Identification: Dalton [name] MRN:  [id number] Date of Evaluation:  02/13/2015 Chief Complaint:  Substance Induced Mood Disorder Principal Diagnosis: Opioid use disorder, severe, dependence Diagnosis:   Patient Active Problem List   Diagnosis Date Noted  . Alcohol use disorder, severe, dependence [F10.20] 02/13/2015  . Cocaine use disorder, severe, dependence [F14.20] 02/13/2015  . Cannabis use disorder, severe, dependence [F12.20] 02/13/2015  . Alcohol withdrawal syndrome without complication [Q96.438] 38/18/4037  . Opioid use disorder, severe, dependence [F11.20] 02/13/2015  . Opioid withdrawal [F11.23] 02/13/2015  . Depression [F32.9]   . Substance abuse [F19.10] 10/14/2014  . Opiate dependence, continuous [F11.20] 10/13/2014  . 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 Eyelid abnormality [H02.9] 03/12/2012  . DIABETES MELLITUS, TYPE II [E11.9] 11/10/2010  . ABSCESS, GLUTEAL [IMO0002] 10/20/2010  . CERVICAL LYMPHADENOPATHY [R59.1] 05/16/2010  . 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 VIRAL URI [J06.9] 10/02/2008  . PLEURAL EFFUSION, LEFT [J90] 03/22/2008  . DYSPNEA/SHORTNESS OF BREATH [R06.09, R09.89] 03/20/2008  . ALLERGIC RHINITIS [J30.9] 02/01/2008  . ASTHMA [J45.909] 02/01/2008  . FEVER, HX OF [Z91.89] 02/01/2008   History of Present Illness: Dalton [name] is an 29 y.o. male who presents with increasing suicidal ideation with multiple plans. Dalton [name] reports that he has had worsening opiate abuse over the last 6 years. He also reports that his alcohol abuse and cocaine use has increased in the last year.  This along wit percocet and vicodin use.  Per initial tele ass't:  he reports he completed a 30 day program at [facility] at [place] however relapsed about a month later and is feeling very hopeless about his future. He reports that he knows if he can't get his drug use under control, that he'll end up  homeless so he might as well kill himself now. He states when he drives he considers driving into a wall or off a bridge and that since he's been in the ED, he's noticed ways he could end his life. He reports they come to his attention constantly over the last week or so. He lives with his girlfriend and her 33 year old daughter and reports that his substance abuse has caused him to lose his job, caused financial difficulty, made him steal from [employer] to get extra money so now he has larceny charges, and has destroyed his personal relationships. He denies HI and AVH and endorses depression including feelings of worthlessness, isolating behavior, anger/irritability, fatigue, insomnia, decreased appetite, and anhedonia.   Seen today and patient was somnolent.  He was pleasant.  When asked to verify the information as written above when he was first seen, he verified his alcohol and cocaine abuse.  He reported that his depression worsened and that it drove him to engage in reckless behavior.  He denied SI/HI/AVH at the current moment.    Elements:  Location:  Opiate abuse. Quality:  feel worthless, hopeless. Severity:  severe. Timing:  in the last year. Duration:  chronic. Context:  see HPI. Associated Signs/Symptoms: Depression Symptoms:  insomnia, feelings of worthlessness/guilt, hopelessness, suicidal attempt, anxiety, panic attacks, (Hypo) Manic Symptoms:  Irritable Mood, Labiality of Mood, Anxiety Symptoms:  Excessive Worry, Social Anxiety, Psychotic Symptoms:  NA PTSD Symptoms: NA Total Time spent with patient: 30 minutes  Past Medical History:  Past Medical History  Diagnosis Date  . Allergy   . Asthma   . Pulmonary tuberculosis     Past Surgical History  Procedure Laterality Date  .  Tonsillectomy     Family History: History reviewed. No pertinent family history. Social History:  History  Alcohol Use No     History  Drug Use No    Comment: former substance abuse     History   Social History  . Marital Status: Single    Spouse Name: N/A  . Number of Children: N/A  . Years of Education: N/A   Social History Main Topics  . Smoking status: Current Every Day Smoker -- 0.50 packs/day    Types: Cigarettes  . Smokeless tobacco: Never Used     Comment: 4 ciggs qd  . Alcohol Use: No  . Drug Use: No     Comment: former substance abuse  . Sexual Activity: Not on file   Other Topics Concern  . None   Social History Narrative   Additional Social History:    History of alcohol / drug use?: Yes  Musculoskeletal: Strength & Muscle Tone: within normal limits Gait & Station: normal Patient leans: N/A  Psychiatric Specialty Exam: Physical Exam  Vitals reviewed.   Review of Systems  Psychiatric/Behavioral: The patient has insomnia.   All other systems reviewed and are negative.   Blood pressure 102/64, pulse 83, temperature 98.3 F (36.8 C), temperature source Oral, resp. rate 20, height 5' 10.5" (1.791 m), weight 63.504 kg (140 lb), SpO2 100 %.Body mass index is 19.8 kg/(m^2).  General Appearance: Fairly Groomed  Engineer, water::  Fair  Speech:  Clear and Coherent  Volume:  Normal  Mood:  Anxious  Affect:  Flat  Thought Process:  Coherent  Orientation:  Full (Time, Place, and Person)  Thought Content:  Rumination  Suicidal Thoughts:  No  Homicidal Thoughts:  No  Memory:  Immediate;   Fair Recent;   Fair Remote;   Fair  Judgement:  Fair  Insight:  Fair  Psychomotor Activity:  Normal  Concentration:  Good  Recall:  Good  Fund of Knowledge:Good  Language: Good  Akathisia:  Negative  Handed:  Right  AIMS (if indicated):     Assets:  Desire for Improvement Housing Resilience Social Support  ADL's:  Intact  Cognition: WNL  Sleep:  Number of Hours: 4.75   Risk to Self: Is patient at risk for suicide?: Yes What has been your use of drugs/alcohol within the last 12 months?: Current use of 100 mg/day of Hydrocodone or Oxycodone.   Drinks 24 oz/beer most days.  Occasional use of cocaine in past week.   Risk to Others:   Prior Inpatient Therapy:   Prior Outpatient Therapy:    Alcohol Screening: 1. How often do you have a drink containing alcohol?: Monthly or less 2. How many drinks containing alcohol do you have on a typical day when you are drinking?: 1 or 2 3. How often do you have six or more drinks on one occasion?: Less than monthly Preliminary Score: 1 9. Have you or someone else been injured as a result of your drinking?: No 10. Has a relative or friend or a doctor or another health worker been concerned about your drinking or suggested you cut down?: No Alcohol Use Disorder Identification Test Final Score (AUDIT): 2 Brief Intervention: AUDIT score less than 7 or less-screening does not suggest unhealthy drinking-brief intervention not indicated  Allergies:   Allergies  Allergen Reactions  . Cephalexin     REACTION: nausea, vomiting   Lab Results:  Results for orders placed or performed during the hospital encounter of 02/12/15 (from the past  48 hour(s))  Acetaminophen level     Status: Abnormal   Collection Time: 02/12/15 12:31 PM  Result Value Ref Range   Acetaminophen (Tylenol), Serum <10.0 (L) 10 - 30 ug/mL    Comment:        THERAPEUTIC CONCENTRATIONS VARY SIGNIFICANTLY. A RANGE OF 10-30 ug/mL MAY BE AN EFFECTIVE CONCENTRATION FOR MANY PATIENTS. HOWEVER, SOME ARE BEST TREATED AT CONCENTRATIONS OUTSIDE THIS RANGE. ACETAMINOPHEN CONCENTRATIONS >150 ug/mL AT 4 HOURS AFTER INGESTION AND >50 ug/mL AT 12 HOURS AFTER INGESTION ARE OFTEN ASSOCIATED WITH TOXIC REACTIONS.   CBC     Status: Abnormal   Collection Time: 02/12/15 12:31 PM  Result Value Ref Range   WBC 13.0 (H) 4.0 - 10.5 K/uL   RBC 5.34 4.22 - 5.81 MIL/uL   Hemoglobin 14.7 13.0 - 17.0 g/dL   HCT 40.7 39.0 - 52.0 %   MCV 76.2 (L) 78.0 - 100.0 fL   MCH 27.5 26.0 - 34.0 pg   MCHC 36.1 (H) 30.0 - 36.0 g/dL   RDW 13.5 11.5 - 15.5 %    Platelets 391 150 - 400 K/uL  Comprehensive metabolic panel     Status: None   Collection Time: 02/12/15 12:31 PM  Result Value Ref Range   Sodium 140 135 - 145 mmol/L   Potassium 4.5 3.5 - 5.1 mmol/L   Chloride 105 96 - 112 mmol/L   CO2 29 19 - 32 mmol/L   Glucose, Bld 77 70 - 99 mg/dL   BUN 13 6 - 23 mg/dL   Creatinine, Ser 1.00 0.50 - 1.35 mg/dL   Calcium 9.6 8.4 - 10.5 mg/dL   Total Protein 7.0 6.0 - 8.3 g/dL   Albumin 3.7 3.5 - 5.2 g/dL   AST 19 0 - 37 U/L   ALT 21 0 - 53 U/L   Alkaline Phosphatase 65 39 - 117 U/L   Total Bilirubin 0.5 0.3 - 1.2 mg/dL   GFR calc non Af Amer >90 >90 mL/min   GFR calc Af Amer >90 >90 mL/min    Comment: (NOTE) The eGFR has been calculated using the CKD EPI equation. This calculation has not been validated in all clinical situations. eGFR's persistently <90 mL/min signify possible Chronic Kidney Disease.    Anion gap 6 5 - 15  Ethanol (ETOH)     Status: None   Collection Time: 02/12/15 12:31 PM  Result Value Ref Range   Alcohol, Ethyl (B) 6 0 - 9 mg/dL    Comment:        LOWEST DETECTABLE LIMIT FOR SERUM ALCOHOL IS 11 mg/dL FOR MEDICAL PURPOSES ONLY   Salicylate level     Status: None   Collection Time: 02/12/15 12:31 PM  Result Value Ref Range   Salicylate Lvl <7.9 2.8 - 20.0 mg/dL  Urine Drug Screen     Status: Abnormal   Collection Time: 02/12/15  2:00 PM  Result Value Ref Range   Opiates NONE DETECTED NONE DETECTED   Cocaine POSITIVE (A) NONE DETECTED   Benzodiazepines NONE DETECTED NONE DETECTED   Amphetamines NONE DETECTED NONE DETECTED   Tetrahydrocannabinol POSITIVE (A) NONE DETECTED   Barbiturates NONE DETECTED NONE DETECTED    Comment:        DRUG SCREEN FOR MEDICAL PURPOSES ONLY.  IF CONFIRMATION IS NEEDED FOR ANY PURPOSE, NOTIFY LAB WITHIN 5 DAYS.        LOWEST DETECTABLE LIMITS FOR URINE DRUG SCREEN Drug Class       Cutoff (ng/mL) Amphetamine  1000 Barbiturate      200 Benzodiazepine   195 Tricyclics        093 Opiates          300 Cocaine          300 THC              50    Current Medications: Current Facility-Administered Medications  Medication Dose Route Frequency Provider Last Rate Last Dose  . acetaminophen (TYLENOL) tablet 650 mg  650 mg Oral Q6H PRN Laverle Hobby, PA-C      . albuterol (PROVENTIL HFA;VENTOLIN HFA) 108 (90 BASE) MCG/ACT inhaler 2 puff  2 puff Inhalation Q4H PRN Laverle Hobby, PA-C      . alum & mag hydroxide-simeth (MAALOX/MYLANTA) 200-200-20 MG/5ML suspension 30 mL  30 mL Oral Q4H PRN Laverle Hobby, PA-C      . chlordiazePOXIDE (LIBRIUM) capsule 25 mg  25 mg Oral Q6H PRN Laverle Hobby, PA-C      . citalopram (CELEXA) tablet 20 mg  20 mg Oral Daily Laverle Hobby, PA-C   20 mg at 02/13/15 0836  . cloNIDine (CATAPRES) tablet 0.1 mg  0.1 mg Oral QID Laverle Hobby, PA-C   0.1 mg at 02/13/15 1312   Followed by  . [START ON 02/15/2015] cloNIDine (CATAPRES) tablet 0.1 mg  0.1 mg Oral BH-qamhs Spencer E Simon, PA-C       Followed by  . [START ON 02/18/2015] cloNIDine (CATAPRES) tablet 0.1 mg  0.1 mg Oral QAC breakfast Laverle Hobby, PA-C      . dicyclomine (BENTYL) tablet 20 mg  20 mg Oral Q6H PRN Laverle Hobby, PA-C      . hydrOXYzine (ATARAX/VISTARIL) tablet 25 mg  25 mg Oral Q6H PRN Laverle Hobby, PA-C      . loperamide (IMODIUM) capsule 2-4 mg  2-4 mg Oral PRN Laverle Hobby, PA-C      . magnesium hydroxide (MILK OF MAGNESIA) suspension 30 mL  30 mL Oral Daily PRN Laverle Hobby, PA-C      . methocarbamol (ROBAXIN) tablet 500 mg  500 mg Oral Q8H PRN Laverle Hobby, PA-C      . multivitamin with minerals tablet 1 tablet  1 tablet Oral Daily Laverle Hobby, PA-C   1 tablet at 02/13/15 2671  . naproxen (NAPROSYN) tablet 500 mg  500 mg Oral BID PRN Laverle Hobby, PA-C      . ondansetron (ZOFRAN-ODT) disintegrating tablet 4 mg  4 mg Oral Q6H PRN Laverle Hobby, PA-C      . thiamine (B-1) injection 100 mg  100 mg Intramuscular Once Laverle Hobby, PA-C    100 mg at 02/13/15 0015  . thiamine (VITAMIN B-1) tablet 100 mg  100 mg Oral Daily Laverle Hobby, PA-C   100 mg at 02/13/15 0836  . traZODone (DESYREL) tablet 50 mg  50 mg Oral QHS,Dalton X 1 Laverle Hobby, PA-C   50 mg at 02/12/15 2339   PTA Medications: Prescriptions prior to admission  Medication Sig Dispense Refill Last Dose  . citalopram (CELEXA) 20 MG tablet Take 1 tablet (20 mg total) by mouth daily. 30 tablet 0   . traZODone (DESYREL) 50 MG tablet Take 1 tablet (50 mg total) by mouth at bedtime and may repeat dose one time if needed. 60 tablet 0    Previous Psychotropic Medications: Yes  Substance Abuse History in the last 12 months:  Yes.  Consequences of Substance Abuse: re admission to hospital  Results for orders placed or performed during the hospital encounter of 02/12/15 (from the past 72 hour(s))  Acetaminophen level     Status: Abnormal   Collection Time: 02/12/15 12:31 PM  Result Value Ref Range   Acetaminophen (Tylenol), Serum <10.0 (L) 10 - 30 ug/mL    Comment:        THERAPEUTIC CONCENTRATIONS VARY SIGNIFICANTLY. A RANGE OF 10-30 ug/mL MAY BE AN EFFECTIVE CONCENTRATION FOR MANY PATIENTS. HOWEVER, SOME ARE BEST TREATED AT CONCENTRATIONS OUTSIDE THIS RANGE. ACETAMINOPHEN CONCENTRATIONS >150 ug/mL AT 4 HOURS AFTER INGESTION AND >50 ug/mL AT 12 HOURS AFTER INGESTION ARE OFTEN ASSOCIATED WITH TOXIC REACTIONS.   CBC     Status: Abnormal   Collection Time: 02/12/15 12:31 PM  Result Value Ref Range   WBC 13.0 (H) 4.0 - 10.5 K/uL   RBC 5.34 4.22 - 5.81 MIL/uL   Hemoglobin 14.7 13.0 - 17.0 g/dL   HCT 40.7 39.0 - 52.0 %   MCV 76.2 (L) 78.0 - 100.0 fL   MCH 27.5 26.0 - 34.0 pg   MCHC 36.1 (H) 30.0 - 36.0 g/dL   RDW 13.5 11.5 - 15.5 %   Platelets 391 150 - 400 K/uL  Comprehensive metabolic panel     Status: None   Collection Time: 02/12/15 12:31 PM  Result Value Ref Range   Sodium 140 135 - 145 mmol/L   Potassium 4.5 3.5 - 5.1 mmol/L   Chloride 105 96  - 112 mmol/L   CO2 29 19 - 32 mmol/L   Glucose, Bld 77 70 - 99 mg/dL   BUN 13 6 - 23 mg/dL   Creatinine, Ser 1.00 0.50 - 1.35 mg/dL   Calcium 9.6 8.4 - 10.5 mg/dL   Total Protein 7.0 6.0 - 8.3 g/dL   Albumin 3.7 3.5 - 5.2 g/dL   AST 19 0 - 37 U/L   ALT 21 0 - 53 U/L   Alkaline Phosphatase 65 39 - 117 U/L   Total Bilirubin 0.5 0.3 - 1.2 mg/dL   GFR calc non Af Amer >90 >90 mL/min   GFR calc Af Amer >90 >90 mL/min    Comment: (NOTE) The eGFR has been calculated using the CKD EPI equation. This calculation has not been validated in all clinical situations. eGFR's persistently <90 mL/min signify possible Chronic Kidney Disease.    Anion gap 6 5 - 15  Ethanol (ETOH)     Status: None   Collection Time: 02/12/15 12:31 PM  Result Value Ref Range   Alcohol, Ethyl (B) 6 0 - 9 mg/dL    Comment:        LOWEST DETECTABLE LIMIT FOR SERUM ALCOHOL IS 11 mg/dL FOR MEDICAL PURPOSES ONLY   Salicylate level     Status: None   Collection Time: 02/12/15 12:31 PM  Result Value Ref Range   Salicylate Lvl <3.5 2.8 - 20.0 mg/dL  Urine Drug Screen     Status: Abnormal   Collection Time: 02/12/15  2:00 PM  Result Value Ref Range   Opiates NONE DETECTED NONE DETECTED   Cocaine POSITIVE (A) NONE DETECTED   Benzodiazepines NONE DETECTED NONE DETECTED   Amphetamines NONE DETECTED NONE DETECTED   Tetrahydrocannabinol POSITIVE (A) NONE DETECTED   Barbiturates NONE DETECTED NONE DETECTED    Comment:        DRUG SCREEN FOR MEDICAL PURPOSES ONLY.  IF CONFIRMATION IS NEEDED FOR ANY PURPOSE, NOTIFY LAB WITHIN 5 DAYS.  LOWEST DETECTABLE LIMITS FOR URINE DRUG SCREEN Drug Class       Cutoff (ng/mL) Amphetamine      1000 Barbiturate      200 Benzodiazepine   915 Tricyclics       041 Opiates          300 Cocaine          300 THC              50     Observation Level/Precautions:  15 minute checks  Laboratory:  per ED  Psychotherapy:  group  Medications:  As per medlist  Consultations:  As  needed  Discharge Concerns:  safety  Estimated LOS: 5-7 days  Other:     Psychological Evaluations: Yes   Treatment Plan Summary: Daily contact with patient to assess and evaluate symptoms and progress in treatment and Medication management  Medical Decision Making:  Review of Psycho-Social Stressors (1), Discuss test with performing physician (1), Review and summation of old records (2), New Problem, with no additional work-up planned (3), Review of Medication Regimen & Side Effects (2) and Review of New Medication or Change in Dosage (2)  I certify that inpatient services furnished can reasonably be expected to improve the patient's condition.   Kerrie Buffalo MAY, AGNP-BC 3/9/20163:21 PM

## 2015-02-13 NOTE — [facility] Counselor (Signed)
Adult Comprehensive Assessment  Patient ID: Dalton [name], male   DOB: 18-May-1986, 29 y.o.   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 [employer] due to absence today Family Relationships: concerned that his "pill habit" has been negative influence on his girlfriend Surveyor, quantity / Lack of resources (include bankruptcy): owes IRS, court fees, Publishing copy / Lack of housing: stable Physical health (include injuries & life threatening diseases): no concerns Social relationships: long term girlfriend Substance abuse: current pill abuser, drinks 24 oz beer/day Bereavement / Loss: none noted  Living/Environment/Situation:  Living Arrangements: Spouse/significant other Living conditions (as described by patient or guardian): stable housing w girlfriend and her 37 year old daughter, goes back and forth to parents as well after arguments How long has patient lived in current situation?: 6 years What is atmosphere in current home: Supportive  Family History:  Marital status: Single Does patient have children?: No  Childhood History:  By whom was/is the patient raised?: Mother Description of patient's relationship with caregiver when they were a child: Good w mother, absent father, relationship w stepfather has improved as patient aged Patient's description of current relationship with people who raised him/her: Good w mother, has only seen father once in his life.  Father in [place], has reached out to reconnect w patient approx 2 years ago after father was diagnosed w cancer, patient says he is developing relationsihip w father Does patient have siblings?: Yes Number of Siblings: 2 Description of patient's current relationship with siblings: Younger sister and stepbrother.   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 degree in social work at [employer] Currently a Consulting civil engineer?: No Learning disability?: No  Employment/Work Situation:   Employment situation: Employed 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 How long has patient been employed?: several months Patient's job has been impacted by current illness: Yes Describe how patient's job has been impacted: Frequent absences and/or leaving work early, states these were due to obtaining pain pills What is the longest time patient has a held a job?: year 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, also owes Probation officer for legal charges here and in [place]) Does patient have a representative payee or guardian?: No  Alcohol/Substance Abuse:   What has been your use of drugs/alcohol within the last 12 months?: Current use of 100 mg/day of Hydrocodone or Oxycodone.  Drinks 24 oz/beer most days.  Occasional use of cocaine in past week.   Alcohol/Substance Abuse Treatment Hx: Past Tx, Inpatient If yes, describe treatment: Was at [facility] for 28 day program after hospitalization at [facility] in 10/2014 Has alcohol/substance abuse ever caused legal problems?: Yes ([place]  [place]  Social Support System:   Patient's Community Support System: Fair Museum/gallery exhibitions officerDescribe Community Support System: Good relationship w long term girlfriend Type of faith/religion: Ephriam KnucklesChristian How does patient's faith help to cope with current illness?: says thats the only way that he can "get over this addiction"  Leisure/Recreation:   Leisure and Hobbies: basketball, sports, spending time w family  Strengths/Needs:   What things does the patient do well?: talking w people about their problems, likes to listen and give wisdom to others In what areas does patient struggle / problems for patient: addiction  Discharge Plan:   Does patient have access to transportation?: Yes Will patient be returning to same living situation after discharge?: Yes Currently receiving community mental health services: No (Was referred to [facility] at discharge but chose not to present for services) If no, would patient like referral for services when discharged?: Yes (What county?) (Wants referral to [facility] at discharge to reconnect) Does patient have financial barriers related to discharge medications?: Yes Patient description of barriers related to discharge medications: "depends on how much the medications cost"  Summary/Recommendations:    Patient is a 29 year old AA male, admitted for detox from pain medications which patient acquires on the street.  States that he first became exposed to pain meds (Vicodin) after wisdom teeth extraction and immediately became addicted.  When unable to obtain pills through provider, continued to abuse by nonprescribed means.  Patient states that he has seen a negative impact on his finances, ability to hold a job, ability to maintain relationships.  Was [facility]- was able to maintain sobriety for approx one month.  Stated he resumed use because he "wondered if he could use recreationally" and wanted "to see my tolerance" -  immediately resumed prior use patterns.  Now feels like "I am in a deep hole, I feel worthless and hopeless."  Thinks people are giving him "no room for error" - has violated people's trust in him through asking for money to support his habit.  Has significant debt, owes court fines/fees/lawyer fees in minor drug related charges.  Feels like his relationship w girlfriend is strained because he does not contribute to the household as a result of spending his money on pills.    Patient will benefit from hospitalization for detox from pain pills and to receive psychoeducation and group therapy services to increase coping skills for and understanding of depression, milieu therapy, medications evaluation, and nursing support.  Patient will develop appropriate coping skills for dealing w overwhelming emotions, stabilize on medications if appropriate, and develop greater insight into and acceptance of his addiction.  CSWs will develop discharge plan to include family support and referral to appropriate after care services, patient requests [facility] if available to him.    Patient is smoker,signed referral to [facility].  Patient was given opportunity to discuss treatment goals and verbalized understanding and agreement to care plan.  Family contact will be made, patient identified mother and girlfriend as information sources.  Santa GeneraAnne Cunningham, LCSW Clinical Social Worker     Santa GeneraCunningham, Anne C. 02/13/2015

## 2015-02-13 NOTE — [facility] Suicide Risk Assessment (Signed)
[facility]   Nursing information obtained from:  Patient Demographic factors:  Male Current Mental Status:  Suicidal ideation indicated by patient Loss Factors:  NA Historical Factors:  Prior suicide attempts Risk Reduction Factors:  Living with another person, especially a relative, Positive social support, Positive therapeutic relationship Total Time spent with patient: 30 minutes Principal Problem: Opioid use disorder, severe, dependence Diagnosis:   Patient Active Problem List   Diagnosis Date Noted  . Alcohol use disorder, severe, dependence [F10.20] 02/13/2015  . Cocaine use disorder, severe, dependence [F14.20] 02/13/2015  . Cannabis use disorder, severe, dependence [F12.20] 02/13/2015  . Alcohol withdrawal syndrome without complication [F10.230] 02/13/2015  . Opioid use disorder, severe, dependence [F11.20] 02/13/2015  . Opioid withdrawal [F11.23] 02/13/2015  . Depression [F32.9]   . Substance abuse [F19.10] 10/14/2014  . Opiate dependence, continuous [F11.20] 10/13/2014  . 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 Eyelid abnormality [H02.9] 03/12/2012  . DIABETES MELLITUS, TYPE II [E11.9] 11/10/2010  . ABSCESS, GLUTEAL [IMO0002] 10/20/2010  . CERVICAL LYMPHADENOPATHY [R59.1] 05/16/2010  . 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 VIRAL URI [J06.9] 10/02/2008  . PLEURAL EFFUSION, LEFT [J90] 03/22/2008  . DYSPNEA/SHORTNESS OF BREATH [R06.09, R09.89] 03/20/2008  . ALLERGIC RHINITIS [J30.9] 02/01/2008  . ASTHMA [J45.909] 02/01/2008  . FEVER, HX OF [Z91.89] 02/01/2008     Continued Clinical Symptoms:  Alcohol Use Disorder Identification Test Final Score (AUDIT): 2 The "Alcohol Use Disorders Identification Test", Guidelines for Use in Primary Care, Second Edition.  World Science writerHealth Organization [facility](WHO). Score between 0-7:  no or low risk or alcohol related problems. Score between 8-15:  moderate risk of alcohol related problems. Score between  16-19:  high risk of alcohol related problems. Score 20 or above:  warrants further diagnostic evaluation for alcohol dependence and treatment.   CLINICAL FACTORS:   Alcohol/Substance Abuse/Dependencies   Musculoskeletal: Strength & Muscle Tone: within normal limits Gait & Station: normal Patient leans: N/A  Psychiatric Specialty Exam: Physical Exam  Review of Systems  Neurological: Positive for tremors.  Psychiatric/Behavioral: Positive for substance abuse.    Blood pressure 102/64, pulse 83, temperature 98.3 F (36.8 C), temperature source Oral, resp. rate 20, height 5' 10.5" (1.791 m), weight 63.504 kg (140 lb), SpO2 100 %.Body mass index is 19.8 kg/(m^2).  General Appearance: Disheveled  Eye Contact::  Minimal  Speech:  Slow  Volume:  Decreased  Mood:  Depressed  Affect:  Congruent  Thought Process:  Coherent  Orientation:  Full (Time, Place, and Person)  Thought Content:  WDL  Suicidal Thoughts:  No  Homicidal Thoughts:  No  Memory:  Immediate;   Fair Recent;   Fair Remote;   Fair  Judgement:  Impaired  Insight:  Fair  Psychomotor Activity:  Tremor  Concentration:  Fair  Recall:  [employer]  Fund of Knowledge:Fair  Language: Fair  Akathisia:  No  Handed:  Right  AIMS (if indicated):     Assets:  Social Support Others:  access to health care  Sleep:  Number of Hours: 4.75  Cognition: WNL  ADL's:  Intact     COGNITIVE FEATURES THAT CONTRIBUTE TO RISK:  Thought constriction (tunnel vision)    SUICIDE RISK:   Mild:  Suicidal ideation of limited frequency, intensity, duration, and specificity.  There are no identifiable plans, no associated intent, mild dysphoria and related symptoms, good self-control (both objective and subjective assessment), few other risk factors, and identifiable protective factors, including available and accessible social support.  PLAN  OF CARE: Patient will benefit from inpatient treatment and stabilization.  Estimated length of stay is  5-7 days.  Reviewed past medical records,treatment plan.  Start CIWA/COWS/clonidine protocol.Librium prn . Celexa 20 mg po daily. Will continue to monitor vitals ,medication compliance and treatment side effects while patient is here.  Will monitor for medical issues as well as call consult as needed.  Reviewed labs ,will order as needed.  CSW will start working on disposition.  Patient to participate in therapeutic milieu .       Medical Decision Making:  Review of Psycho-Social Stressors (1), Review or order clinical lab tests (1), Review and summation of old records (2), Established Problem, Worsening (2), Review of Last Therapy Session (1), Review of Medication Regimen & Side Effects (2) and Review of New Medication or Change in Dosage (2)  I certify that inpatient services furnished can reasonably be expected to improve the patient's condition.   Raneem Mendolia MD 02/13/2015, 3:16 PM

## 2015-02-14 DIAGNOSIS — F3289 Other specified depressive episodes: Secondary | ICD-10-CM

## 2015-02-14 DIAGNOSIS — F19239 Other psychoactive substance dependence with withdrawal, unspecified: Secondary | ICD-10-CM | POA: Diagnosis present

## 2015-02-14 DIAGNOSIS — F1924 Other psychoactive substance dependence with psychoactive substance-induced mood disorder: Secondary | ICD-10-CM

## 2015-02-14 NOTE — Progress Notes (Signed)
[facility] MD Progress Note  02/14/2015 2:16 PM Dalton [name]  MRN:  [id number] Subjective:  Patient states " I am OK.'  Objective: Patient seen and chart reviewed.Patient discussed with treatment team. Pt continues to be very limited , answers questions in monosyllables, is guarded. Patient denies any withdrawal sx. Pt reports his medications as effective. Denies any new concerns. Denies SI/HI/AH/VH. I have reviewed his COWS/CIWA. Patient encouraged to participate in groups. Pt denies any side effects . Pt is motivated to get help with his substance abuse. CSW will work on referral.     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 Alcohol use disorder, severe, dependence [F10.20] 02/13/2015  . Cocaine use disorder, severe, dependence [F14.20] 02/13/2015  . Cannabis use disorder, severe, dependence [F12.20] 02/13/2015  . Alcohol withdrawal syndrome without complication [F10.230] 02/13/2015  . Opioid use disorder, severe, dependence [F11.20] 02/13/2015  . Opioid withdrawal [F11.23] 02/13/2015  . Depression [F32.9]   . Substance abuse [F19.10] 10/14/2014  . Opiate dependence, continuous [F11.20] 10/13/2014  . 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 Eyelid abnormality [H02.9] 03/12/2012  . DIABETES MELLITUS, TYPE II [E11.9] 11/10/2010  . ABSCESS, GLUTEAL [IMO0002] 10/20/2010  . CERVICAL LYMPHADENOPATHY [R59.1] 05/16/2010  . 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 VIRAL URI [J06.9] 10/02/2008  . PLEURAL EFFUSION, LEFT [J90] 03/22/2008  . DYSPNEA/SHORTNESS OF BREATH [R06.09, R09.89] 03/20/2008  . ALLERGIC RHINITIS [J30.9] 02/01/2008  . ASTHMA [J45.909] 02/01/2008  . FEVER, HX OF [Z91.89] 02/01/2008   Total Time spent  with patient: 30 minutes   Past Medical History:  Past Medical History  Diagnosis Date  . Allergy   . Asthma   . Pulmonary tuberculosis     Past Surgical History  Procedure Laterality Date  . Tonsillectomy     Family History: History reviewed. No pertinent family history. Social History:  History  Alcohol Use No     History  Drug Use No    Comment: former substance abuse    History   Social History  . Marital Status: Single    Spouse Name: N/A  . Number of Children: N/A  . Years of Education: N/A   Social History Main Topics  . Smoking status: Current Every Day Smoker -- 0.50 packs/day    Types: Cigarettes  . Smokeless tobacco: Never Used     Comment: 4 ciggs qd  . Alcohol Use: No  . Drug Use: No     Comment: former substance abuse  . Sexual Activity: Not on file   Other Topics Concern  . None   Social History Narrative   Additional History:    Sleep: Good  Appetite:  Good     Musculoskeletal: Strength & Muscle Tone: within normal limits Gait & Station: normal Patient leans: N/A   Psychiatric Specialty Exam: Physical Exam  Review of Systems  Psychiatric/Behavioral: Positive for depression (IMPROVING) and substance abuse.    Blood pressure 102/62, pulse 61, temperature 98.2 F (36.8 C), temperature source Oral, resp. rate 17, height 5' 10.5" (1.791 m), weight 63.504 kg (140 lb), SpO2 100 %.Body mass index is 19.8 kg/(m^2).  General Appearance: Fairly Groomed  Patent attorney::  Minimal  Speech:  Normal Rate  Volume:  Normal  Mood:  Depressed  Affect:  Blunt  Thought Process:  Coherent  Orientation:  Full (  Time, Place, and Person)  Thought Content:  WDL  Suicidal Thoughts:  No  Homicidal Thoughts:  No  Memory:  Immediate;   Fair Recent;   Fair Remote;   Fair  Judgement:  Fair  Insight:  Fair  Psychomotor Activity:  Normal  Concentration:  Fair  Recall:  [employer]Fair  Fund of Knowledge:Fair  Language: Fair  Akathisia:  No  Handed:  Right   AIMS (if indicated):     Assets:  Communication Skills Desire for Improvement Physical Health Social Support  ADL's:  Intact  Cognition: WNL  Sleep:  Number of Hours: 6.75     Current Medications: Current Facility-Administered Medications  Medication Dose Route Frequency Provider Last Rate Last Dose  . acetaminophen (TYLENOL) tablet 650 mg  650 mg Oral Q6H PRN Kerry HoughSpencer E Simon, PA-C      . albuterol (PROVENTIL HFA;VENTOLIN HFA) 108 (90 BASE) MCG/ACT inhaler 2 puff  2 puff Inhalation Q4H PRN Kerry HoughSpencer E Simon, PA-C      . alum & mag hydroxide-simeth (MAALOX/MYLANTA) 200-200-20 MG/5ML suspension 30 mL  30 mL Oral Q4H PRN Kerry HoughSpencer E Simon, PA-C      . chlordiazePOXIDE (LIBRIUM) capsule 25 mg  25 mg Oral Q6H PRN Kerry HoughSpencer E Simon, PA-C      . citalopram (CELEXA) tablet 20 mg  20 mg Oral Daily Kerry HoughSpencer E Simon, PA-C   20 mg at 02/14/15 [id number]  . cloNIDine (CATAPRES) tablet 0.1 mg  0.1 mg Oral QID Kerry HoughSpencer E Simon, PA-C   0.1 mg at 02/13/15 1312   Followed by  . [START ON 02/15/2015] cloNIDine (CATAPRES) tablet 0.1 mg  0.1 mg Oral BH-qamhs Spencer E Simon, PA-C       Followed by  . [START ON 02/18/2015] cloNIDine (CATAPRES) tablet 0.1 mg  0.1 mg Oral QAC breakfast Kerry HoughSpencer E Simon, PA-C      . dicyclomine (BENTYL) tablet 20 mg  20 mg Oral Q6H PRN Kerry HoughSpencer E Simon, PA-C      . hydrOXYzine (ATARAX/VISTARIL) tablet 25 mg  25 mg Oral Q6H PRN Kerry HoughSpencer E Simon, PA-C      . loperamide (IMODIUM) capsule 2-4 mg  2-4 mg Oral PRN Kerry HoughSpencer E Simon, PA-C      . magnesium hydroxide (MILK OF MAGNESIA) suspension 30 mL  30 mL Oral Daily PRN Kerry HoughSpencer E Simon, PA-C      . methocarbamol (ROBAXIN) tablet 500 mg  500 mg Oral Q8H PRN Kerry HoughSpencer E Simon, PA-C      . multivitamin with minerals tablet 1 tablet  1 tablet Oral Daily Kerry HoughSpencer E Simon, PA-C   1 tablet at 02/14/15 [id number]  . naproxen (NAPROSYN) tablet 500 mg  500 mg Oral BID PRN Kerry HoughSpencer E Simon, PA-C      . ondansetron (ZOFRAN-ODT) disintegrating tablet 4 mg  4 mg Oral Q6H  PRN Kerry HoughSpencer E Simon, PA-C      . thiamine (B-1) injection 100 mg  100 mg Intramuscular Once Kerry HoughSpencer E Simon, PA-C   100 mg at 02/13/15 0015  . thiamine (VITAMIN B-1) tablet 100 mg  100 mg Oral Daily Kerry HoughSpencer E Simon, PA-C   100 mg at 02/14/15 [id number]  . traZODone (DESYREL) tablet 50 mg  50 mg Oral QHS,MR X 1 Kerry HoughSpencer E Simon, PA-C   50 mg at 02/13/15 2126    Lab Results: No results found for this or any previous visit (from the past 48 hour(s)).  Physical Findings: AIMS: Facial and Oral Movements Muscles of Facial Expression: None, normal Lips  and Perioral Area: None, normal Jaw: None, normal Tongue: None, normal,Extremity Movements Upper (arms, wrists, hands, fingers): None, normal Lower (legs, knees, ankles, toes): None, normal, Trunk Movements Neck, shoulders, hips: None, normal, Overall Severity Severity of abnormal movements (highest score from questions above): None, normal Incapacitation due to abnormal movements: None, normal Patient's awareness of abnormal movements (rate only patient's report): No Awareness, Dental Status Current problems with teeth and/or dentures?: No Does patient usually wear dentures?: No  CIWA:  CIWA-Ar Total: 0 COWS:  COWS Total Score: 0   Assessment: Pt is a 29 yr old AAM with hx of severe substance abuse (opioid, cocaine, alcohol,cannabis) presented after having worsening SI ,due to guilt about his abuse. Pt with improvement of his mood sx, is motivated to get help.   Treatment Plan Summary: Daily contact with patient to assess and evaluate symptoms and progress in treatment and Medication management  Will continue COWS/Clonidine protocol. Will continue Librium/CIWA. Pt denies withdrawal sx. Will continue Celexa 20 mg po daily. Will refer to substance abuse program. CSW will work on disposition.     Medical Decision Making:  Review of Psycho-Social Stressors (1), Review or order clinical lab tests (1), Review of Last Therapy Session (1), Review  of Medication Regimen & Side Effects (2) and Review of New Medication or Change in Dosage (2)     Keysean Savino MD 02/14/2015, 2:16 PM

## 2015-02-14 NOTE — [facility] Group Notes (Signed)
[facility] LCSW Group Therapy 02/14/2015 1:15 PM Type of Therapy: Group Therapy Participation Level: Active  Participation Quality: Attentive, Sharing and Supportive  Affect: Depressed and Flat  Cognitive: Alert and Oriented  Insight: Developing/Improving and Engaged  Engagement in Therapy: Developing/Improving and Engaged  Modes of Intervention: Activity, Clarification, Confrontation, Discussion, Education, Exploration, Limit-setting, Orientation, Problem-solving, Rapport Building, Dance movement psychotherapisteality Testing, Socialization and Support  Summary of Progress/Problems: Patient was attentive and engaged with speaker from [employer]. Patient was attentive to speaker while they shared their story of dealing with mental health and overcoming it. Patient expressed interest in their programs and services and received information on their agency. Patient processed ways they can relate to the speaker.   Dalton BruinKristin Tirzah [name], MSW, Am[employer] IncLCSWA Clinical Social Worker [facility] [phone]

## 2015-02-14 NOTE — Progress Notes (Signed)
Pt alert and cooperative. Affect/ mood anxious and depressed. -SI/HI, verbally contracts for safety. -A/[place]..Visible in milieu, attended evening group, no participation. Denies physical pain or discomfort Emotional support and encouragement given. Will monitor closely and evaluate for stabilization.

## 2015-02-14 NOTE — [facility] Suicide Risk Assessment (Signed)
[facility] INPATIENT:  Family/Significant Other Suicide Prevention Education  Suicide Prevention Education:  Contact Attempts: Angelica Batts (p'ts gf) [phone] and Apolonio SchneidersBetty Lea (pt's mother) [phone] have been identified by the patient as the family member/significant other with whom the patient will be residing, and identified as the person(s) who will aid the patient in the event of a mental health crisis.  With written consent from the patient, two attempts were made to provide suicide prevention education, prior to and/or following the patient's discharge.  We were unsuccessful in providing suicide prevention education.  A suicide education pamphlet was given to the patient to share with family/significant other.  Date and time of first attempt: 9:30AM on 02/14/15. unable to leave voicemail.  Date and time of second attempt: 3:15PM on 02/14/15. Unable to leave vm/no answer on pt's mother's phone.   Smart, Ednamae Schiano LCSWA  02/14/2015, 3:17 PM

## 2015-02-14 NOTE — Progress Notes (Signed)
D:  Patient's self inventory sheet, patient sleeps good, medication is helpful.  Poor appetite, low energy level, good concentration.  Denied depression, hopeless and anxiety.  Denied SI.  Denied physical problems.  Denied pain.  Goal is to let go of mistakes.  Plans to focus on today.  Does have discharge plans.  No problems anticipated after discharge. A:  Patient did not take his clonidine today, stated it makes him feel drowsy.  Patient took all other medications.  Emotional support and encouragement given patient. R:  Denied SI and HI, contracts for safety.  Denied A/V hallucinations.  Safety maintained with 15 minute checks.

## 2015-02-14 NOTE — Plan of Care (Signed)
Problem: Consults Goal: Suicide Risk Patient Education (See Patient Education module for education specifics)  Outcome: Completed/Met Date Met:  02/14/15 Nurse discussed suicidal information with patient.

## 2015-02-14 NOTE — [facility] Group Notes (Signed)
The focus of this group is to educate the patient on the purpose and policies of crisis stabilization and provide a format to answer questions about their admission.  The group details unit policies and expectations of patients while admitted.  Patient did not attend 0900 nurse education orientation group this morning.  Patient stayed in bed.   

## 2015-02-15 MED ORDER — ALBUTEROL SULFATE HFA 108 (90 BASE) MCG/ACT IN AERS: 2.0000 | INHALATION_SPRAY | RESPIRATORY_TRACT | Status: DC | PRN

## 2015-02-15 MED ORDER — TRAZODONE HCL 50 MG PO TABS: 50.0000 mg | ORAL_TABLET | Freq: Every evening | ORAL | Status: DC | PRN

## 2015-02-15 MED ORDER — NICOTINE 21 MG/24HR TD PT24
21.0000 mg | MEDICATED_PATCH | Freq: Every day | TRANSDERMAL | Status: DC
  Administered 2015-02-15: 21 mg via TRANSDERMAL
  Filled 2015-02-15 (×3): qty 1

## 2015-02-15 MED ORDER — CITALOPRAM HYDROBROMIDE 20 MG PO TABS: 20.0000 mg | ORAL_TABLET | Freq: Every day | ORAL | Status: DC

## 2015-02-15 NOTE — Progress Notes (Signed)
Recreation Therapy Notes  Date: 03.11.2016 Time: 9:30am Location: 300 Hall Group Room   Group Topic: Stress Management  Goal Area(s) Addresses:  Patient will actively participate in stress management techniques presented during session.   Behavioral Response: Did not attend.   Reiner Loewen L Araiya Tilmon, LRT/CTRS  Garyn Arlotta L 02/15/2015 10:20 AM 

## 2015-02-15 NOTE — Progress Notes (Signed)
D: Patient is alert and oriented. Pt's mood and affect is depressed and appropriate to circumstance, pleasant upon interaction. Pt denies SI/HI and AVH. Pt refused 0800 medications, and states "I don't need them." Pt reports he is ready to discharge today. Pt rates his depression and hopelessness 5/10, and anxiety 10/10. Pt reports his goal for the day is "let go of past." Pt is attending groups. Pt expresses concerns about his follow up appointment being on the 18th, he is concerned that it is not soon enough, pt encouraged to attend NA for support from peers. A: MD, Eappen made aware of pt's refusal. LCSW, Belenda CruiseKristin made aware of pt's follow up concerns. Active listening by RN. Encouragement/Support provided to pt. 15 minute checks continued per protocol for patient safety.  R: Patient non-adherent to medication regimen. Pt remains safe.

## 2015-02-15 NOTE — [facility] Suicide Risk Assessment (Signed)
[facility] Discharge Suicide Risk Assessment   Demographic Factors:  Male  Total Time spent with patient: 30 minutes  Musculoskeletal: Strength & Muscle Tone: within normal limits Gait & Station: normal Patient leans: N/A  Psychiatric Specialty Exam: Physical Exam  Review of Systems  Psychiatric/Behavioral: Positive for substance abuse (stable). Negative for depression and suicidal ideas. The patient is not nervous/anxious and does not have insomnia.     Blood pressure 110/66, pulse 84, temperature 98.5 F (36.9 C), temperature source Oral, resp. rate 20, height 5' 10.5" (1.791 m), weight 63.504 kg (140 lb), SpO2 100 %.Body mass index is 19.8 kg/(m^2).  General Appearance: Casual  Eye Contact::  Fair  Speech:  Normal Rate409  Volume:  Normal  Mood:  Depressed  Affect:  Restricted  Thought Process:  Coherent  Orientation:  Full (Time, Place, and Person)  Thought Content:  WDL  Suicidal Thoughts:  No  Homicidal Thoughts:  No  Memory:  Immediate;   Fair Recent;   Fair Remote;   Fair  Judgement:  Fair  Insight:  Fair  Psychomotor Activity:  Normal  Concentration:  Fair  Recall:  [employer] of Knowledge:Fair  Language: Fair  Akathisia:  No  Handed:  Right  AIMS (if indicated):     Assets:  Communication Skills Desire for Improvement  Sleep:  Number of Hours: 4.75  Cognition: WNL  ADL's:  Intact   Have you used any form of tobacco in the last 30 days? (Cigarettes, Smokeless Tobacco, Cigars, and/or Pipes): Yes  Has this patient used any form of tobacco in the last 30 days? (Cigarettes, Smokeless Tobacco, Cigars, and/or Pipes) Yes, Prescription for nicotine patch.  Mental Status Per Nursing Assessment::   On Admission:  Suicidal ideation indicated by patient  Current Mental Status by Physician: patient denies SI/HI/AH/VH.  Loss Factors: NA  Historical Factors: Impulsivity  Risk Reduction Factors:   Positive social support  Continued Clinical Symptoms:   Depression:   Impulsivity Alcohol/Substance Abuse/Dependencies  Cognitive Features That Contribute To Risk:  Polarized thinking    Suicide Risk:  Minimal: No identifiable suicidal ideation.  Patients presenting with no risk factors but with morbid ruminations; may be classified as minimal risk based on the severity of the depressive symptoms  Principal Problem: Substance or medication-induced depressive disorder with onset during withdrawal Discharge Diagnoses:  Patient Active Problem List   Diagnosis Date Noted  . 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 Alcohol use disorder, severe, dependence [F10.20] 02/13/2015  . Cocaine use disorder, severe, dependence [F14.20] 02/13/2015  . Cannabis use disorder, severe, dependence [F12.20] 02/13/2015  . Alcohol withdrawal syndrome without complication [F10.230] 02/13/2015  . Opioid use disorder, severe, dependence [F11.20] 02/13/2015  . Opioid withdrawal [F11.23] 02/13/2015  . Depression [F32.9]   . Substance abuse [F19.10] 10/14/2014  . Opiate dependence, continuous [F11.20] 10/13/2014  . 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 Eyelid abnormality [H02.9] 03/12/2012  . DIABETES MELLITUS, TYPE II [E11.9] 11/10/2010  . ABSCESS, GLUTEAL [IMO0002] 10/20/2010  . CERVICAL LYMPHADENOPATHY [R59.1] 05/16/2010  . 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 VIRAL URI [J06.9] 10/02/2008  . PLEURAL EFFUSION, LEFT [J90] 03/22/2008  . DYSPNEA/SHORTNESS OF BREATH [R06.09, R09.89] 03/20/2008  . ALLERGIC RHINITIS [J30.9] 02/01/2008  . ASTHMA [J45.909] 02/01/2008  . FEVER, HX OF [Z91.89] 02/01/2008    Follow-up Information    Follow up with [facility] On 02/22/2015.   Why:  Triage assessment on this date at 8:00AM with Noni Saupe.  Please bring photo ID to this appt. Appt will last approximately 1-1 1/2 hours.    Contact information:   [address] [place], [place]  [address] Phone: [phone]. [phone] Fax: [phone]      Plan Of Care/Follow-up recommendations:  Activity:  No restrictions Diet:  regular Tests:  as needed Other:  follow up with aftercare  Is patient on multiple antipsychotic therapies at discharge:  No   Has Patient had three or more failed trials of antipsychotic monotherapy by history:  No  Recommended Plan for Multiple Antipsychotic Therapies: NA    Jeshurun Oaxaca MD 02/15/2015, 9:45 AM

## 2015-02-15 NOTE — [facility] Group Notes (Signed)
   [facility]  02/15/2015  8:45 AM   Participation Quality: Alert, Appropriate and Oriented  Mood/Affect: Depressed and Flat  Depression Rating: 3  Anxiety Rating: 0  Thoughts of Suicide: Pt denies SI/HI  Will you contract for safety? Yes  Current AVH: Pt denies  Plan for Discharge/Comments: Pt attended discharge planning group and actively participated in group. CSW provided pt with today's workbook. Patient plans to return home to follow up with [facility] for outpatient services.  Transportation Means: Pt reports access to transportation  Supports: No supports mentioned at this time  Samuella BruinKristin Carolyne Whitsel, MSW, [employer]CSWA Clinical Social Worker [facility] [phone]

## 2015-02-15 NOTE — Discharge Summary (Signed)
Physician Discharge Summary Note  Patient:  Dalton [name] is an 29 y.o., male MRN:  [id number] DOB:  October 02, 1986 Patient phone:  [phone] (home)  Patient address:   [address] [place] [address],  Total Time spent with patient: Greater than 30 minutes  Date of Admission:  02/12/2015 Date of Discharge: 02/15/15 Reason for Admission:  Drug detox, suicidal ideations  Principal Problem: Substance or medication-induced depressive disorder with onset during withdrawal Discharge Diagnoses: Patient Active Problem List   Diagnosis Date Noted  . 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 Alcohol use disorder, severe, dependence [F10.20] 02/13/2015  . Cocaine use disorder, severe, dependence [F14.20] 02/13/2015  . Cannabis use disorder, severe, dependence [F12.20] 02/13/2015  . Alcohol withdrawal syndrome without complication [F10.230] 02/13/2015  . Opioid use disorder, severe, dependence [F11.20] 02/13/2015  . Opioid withdrawal [F11.23] 02/13/2015  . Depression [F32.9]   . Substance abuse [F19.10] 10/14/2014  . Opiate dependence, continuous [F11.20] 10/13/2014  . 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 Eyelid abnormality [H02.9] 03/12/2012  . DIABETES MELLITUS, TYPE II [E11.9] 11/10/2010  . ABSCESS, GLUTEAL [IMO0002] 10/20/2010  . CERVICAL LYMPHADENOPATHY [R59.1] 05/16/2010  . 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 VIRAL URI [J06.9] 10/02/2008  . PLEURAL EFFUSION, LEFT [J90] 03/22/2008  . DYSPNEA/SHORTNESS OF BREATH [R06.09, R09.89] 03/20/2008  . ALLERGIC RHINITIS [J30.9] 02/01/2008  . ASTHMA [J45.909] 02/01/2008  . FEVER, HX OF [Z91.89] 02/01/2008   Musculoskeletal: Strength & Muscle Tone: within normal limits Gait & Station: normal Patient leans: N/A  Psychiatric Specialty Exam: Physical Exam  Psychiatric: His speech is normal and behavior is normal. Judgment and thought content normal.  His mood appears not anxious. His affect is not angry, not blunt, not labile and not inappropriate. Cognition and memory are normal. He does not exhibit a depressed mood.    Review of Systems  Constitutional: Negative.   HENT: Negative.   Eyes: Negative.   Respiratory: Negative.   Cardiovascular: Negative.   Gastrointestinal: Negative.   Genitourinary: Negative.   Musculoskeletal: Negative.   Skin: Negative.   Neurological: Negative.   Endo/Heme/Allergies: Negative.   Psychiatric/Behavioral: Positive for depression (stable) and substance abuse (Polysubstance dependence). Negative for suicidal ideas, hallucinations and memory loss. The patient has insomnia (Stable). The patient is not nervous/anxious.     Blood pressure 103/69, pulse 76, temperature 98.5 F (36.9 C), temperature source Oral, resp. rate 20, height 5' 10.5" (1.791 m), weight 63.504 kg (140 lb), SpO2 100 %.Body mass index is 19.8 kg/(m^2).  See MD's SRA   Past Medical History:  Past Medical History  Diagnosis Date  . Allergy   . Asthma   . Pulmonary tuberculosis     Past Surgical History  Procedure Laterality Date  . Tonsillectomy     Family History: History reviewed. No pertinent family history. Social History:  History  Alcohol Use No     History  Drug Use No    Comment: former substance abuse    History   Social History  . Marital Status: Single    Spouse Name: N/A  . Number of Children: N/A  . Years of Education: N/A   Social History Main Topics  . Smoking status: Current Every Day Smoker -- 0.50 packs/day    Types: Cigarettes  . Smokeless tobacco: Never Used     Comment: 4 ciggs qd  . Alcohol Use: No  . Drug Use: No     Comment: former substance abuse  .  Sexual Activity: Not on file   Other Topics Concern  . None   Social History Narrative   Risk to Self: Is patient at risk for suicide?: Yes What has been your use of drugs/alcohol within the last 12 months?: Current use of 100 mg/day  of Hydrocodone or Oxycodone.  Drinks 24 oz/beer most days.  Occasional use of cocaine in past week.    Risk to Others: No Prior Inpatient Therapy: Yes Prior Outpatient Therapy: Yes  Level of Care:  OP  Hospital Course:  Dalton [name] is an 29 y.o. male who presents with increasing suicidal ideation with multiple plans. Dalton [name] reports that he has had worsening opiate abuse over the last 6 years. He also reports that his alcohol abuse and cocaine use has increased in the last year. This along with percocet and vicodin use. He reports he completed a 30 day program at [facility] [place] however relapsed about a month later and is feeling very hopeless about his future. He reports that he knows if he can't get his drug use under control, that he'll end up homeless so he might as well kill himself now. He states when he drives he considers driving into a wall or off a bridge and that since he's been in the ED, he's noticed ways he could end his life.         Dalton [name] was admitted to the adult unit. He was evaluated and his symptoms were identified. Medication management was discussed and initiated. The patient completed the clonidine protocol for opiate detox. His Celexa 20 mg daily was continued for treatment of depression. He was oriented to the unit and encouraged to participate in unit programming. Medical problems were identified and treated appropriately. Home medication was restarted as needed.        The patient was evaluated each day by a clinical provider to ascertain the patient's response to treatment.  Improvement was noted by the patient's report of decreasing symptoms, improved sleep and appetite, affect, medication tolerance, behavior, and participation in unit programming.  He was asked each day to complete a self inventory noting mood, mental status, pain, new symptoms, anxiety and concerns.         He responded well to medication and being in a therapeutic and  supportive environment. Positive and appropriate behavior was noted and the patient was motivated for recovery.  The patient worked closely with the treatment team and case manager to develop a discharge plan with appropriate goals. Coping skills, problem solving as well as relaxation therapies were also part of the unit programming. The patient appeared very motivated to get help with his substance abuse.          By the day of discharge he was in much improved condition than upon admission.  Symptoms were reported as significantly decreased or resolved completely. The patient denied SI/HI and voiced no AVH. He was motivated to continue taking medication with a goal of continued improvement in mental health.  Dalton [name] was discharged home with a plan to follow up as noted below. The patient was provided with sample medications and prescriptions at time of discharge. He left [facility] in stable condition with all belongings returned to him.   Consults:  psychiatry  Significant Diagnostic Studies:  labs: CBC with diff, CMP, UDS, toxicology tests, U/A, results reviewed, stable  Discharge Vitals:   Blood pressure 103/69, pulse 76, temperature 98.5 F (36.9 C), temperature source Oral, resp. rate  20, height 5' 10.5" (1.791 m), weight 63.504 kg (140 lb), SpO2 100 %. Body mass index is 19.8 kg/(m^2). Lab Results:   No results found for this or any previous visit (from the past 72 hour(s)).  Physical Findings: AIMS: Facial and Oral Movements Muscles of Facial Expression: None, normal Lips and Perioral Area: None, normal Jaw: None, normal Tongue: None, normal,Extremity Movements Upper (arms, wrists, hands, fingers): None, normal Lower (legs, knees, ankles, toes): None, normal, Trunk Movements Neck, shoulders, hips: None, normal, Overall Severity Severity of abnormal movements (highest score from questions above): None, normal Incapacitation due to abnormal movements: None, normal Patient's  awareness of abnormal movements (rate only patient's report): No Awareness, Dental Status Current problems with teeth and/or dentures?: No Does patient usually wear dentures?: No  CIWA:  CIWA-Ar Total: 0 COWS:  COWS Total Score: 1   See Psychiatric Specialty Exam and Suicide Risk Assessment completed by Attending Physician prior to discharge.  Discharge destination:  Home  Is patient on multiple antipsychotic therapies at discharge:  No   Has Patient had three or more failed trials of antipsychotic monotherapy by history:  No  Recommended Plan for Multiple Antipsychotic Therapies: NA    Medication List    TAKE these medications      Indication   albuterol 108 (90 BASE) MCG/ACT inhaler  Commonly known as:  PROVENTIL HFA;VENTOLIN HFA  Inhale 2 puffs into the lungs every 4 (four) hours as needed for wheezing or shortness of breath.   Indication:  Asthma     citalopram 20 MG tablet  Commonly known as:  CELEXA  Take 1 tablet (20 mg total) by mouth daily. For depression   Indication:  Depression     traZODone 50 MG tablet  Commonly known as:  DESYREL  Take 1 tablet (50 mg total) by mouth at bedtime and may repeat dose one time if needed. For sleep   Indication:  Trouble Sleeping       Follow-up Information    Follow up with [facility] On 02/22/2015.   Why:  Triage assessment on this date at 8:00AM with Noni SaupeJamie Duvall. Please bring photo ID to this appt. Appt will last approximately 1-1 1/2 hours.    Contact information:   [address] [place], [place] [address] Phone: [phone]. [phone] Fax: [phone]     Follow-up recommendations: Activity:  As tolerated Diet: As recommended by your primary care doctor. Keep all scheduled follow-up appointments as recommended.    Comments: Take all your medications as prescribed by your mental healthcare provider. Report any adverse effects and or reactions from your medicines to your outpatient provider promptly. Patient is  instructed and cautioned to not engage in alcohol and or illegal drug use while on prescription medicines. In the event of worsening symptoms, patient is instructed to call the crisis hotline, 911 and or go to the nearest ED for appropriate evaluation and treatment of symptoms. Follow-up with your primary care provider for your other medical issues, concerns and or health care needs.   Total Discharge Time: Greater than 30 minutes  Signed: Fransisca KaufmannDavis, Zein Helbing NP-C 02/15/2015, 7:13 PM

## 2015-02-15 NOTE — Progress Notes (Signed)
D   Pt endorses depression and anxiety   He interacts appropriately with others he attends and participates in groups  A    Verbal support given   Medications administered and effectiveness monitored   Q 15 min checks R   Pt safe at present

## 2015-02-15 NOTE — Progress Notes (Signed)
  [facility] :  Will you be returning to the same living situation after discharge:  Yes,  Patient plans to return home At discharge, do you have transportation home?: Yes,  patient reports access to transportation Do you have the ability to pay for your medications: Yes,  patient will be provided with medication samples and prescriptions at discharge  Release of information consent forms completed and in the chart;  Patient's signature needed at discharge.  Patient to Follow up at: Follow-up Information    Follow up with [facility] On 02/22/2015.   Why:  Triage assessment on this date at 8:00AM with Noni SaupeJamie Duvall. Please bring photo ID to this appt. Appt will last approximately 1-1 1/2 hours.    Contact information:   [address] [place], [place] [address] Phone: [phone]. [phone] Fax: [phone]      Patient denies SI/HI: Yes,  denies    Safety Planning and Suicide Prevention discussed: Yes,  with patient  Have you used any form of tobacco in the last 30 days? (Cigarettes, Smokeless Tobacco, Cigars, and/or Pipes): Yes  Has patient been referred to the Quitline?: Yes, faxed on 02/14/15  Isaac Dubie, [place]ristin L 02/15/2015, 10:54 AM

## 2015-02-15 NOTE — Progress Notes (Signed)
DISCHARGE NOTE: D: Patient was alert, oriented, in stable condition, and ambulatory with a steady gait upon discharge. Pt denies SI/HI and AVH. A: AVS reviewed and given to pt. Follow up reviewed with pt. Resources reviewed with pt, including [facility]. Prescriptions/Medications given to pt. Belongings returned to pt. Pt given time to ask questions and express concerns. Pt encouraged to attend NA meetings. R: Pt D/C'd to mother.

## 2015-02-15 NOTE — Progress Notes (Signed)
Patient did attend the evening karaoke group.  

## 2015-02-19 NOTE — Progress Notes (Signed)
Patient Discharge Instructions:  After Visit Summary (AVS):   Faxed to:  02/19/15 Discharge Summary Note:   Faxed to:  02/19/15 Psychiatric Admission Assessment Note:   Faxed to:  02/19/15 Suicide Risk Assessment - Discharge Assessment:   Faxed to:  02/19/15 Faxed/Sent to the Next Level Care provider:  02/19/15 Faxed to [facility] @ [phone]  Jerelene ReddenSheena E Waverly, 02/19/2015, 3:46 PM

## 2015-09-03 ENCOUNTER — Emergency Department (HOSPITAL_COMMUNITY)
Admission: EM | Admit: 2015-09-03 | Discharge: 2015-09-03 | Disposition: A | Discharge status: Other Acute Inpatient Hospital | Payer: 59 | Attending: Emergency Medicine | Admitting: Emergency Medicine

## 2015-09-03 ENCOUNTER — Inpatient Hospital Stay (HOSPITAL_COMMUNITY)
Admission: AD | Admit: 2015-09-03 | Discharge: 2015-09-09 | DRG: 885 | Disposition: A | Discharge status: Home / Self Care | Payer: Federal, State, Local not specified - Other | Source: Intra-hospital | Attending: Psychiatry | Admitting: Psychiatry

## 2015-09-03 ENCOUNTER — Encounter (HOSPITAL_COMMUNITY): Payer: Self-pay

## 2015-09-03 DIAGNOSIS — F1190 Opioid use, unspecified, uncomplicated: Secondary | ICD-10-CM | POA: Diagnosis present

## 2015-09-03 DIAGNOSIS — F17210 Nicotine dependence, cigarettes, uncomplicated: Secondary | ICD-10-CM | POA: Diagnosis present

## 2015-09-03 DIAGNOSIS — F1020 Alcohol dependence, uncomplicated: Secondary | ICD-10-CM | POA: Diagnosis present

## 2015-09-03 DIAGNOSIS — Z79899 Other long term (current) drug therapy: Secondary | ICD-10-CM | POA: Insufficient documentation

## 2015-09-03 DIAGNOSIS — F419 Anxiety disorder, unspecified: Secondary | ICD-10-CM | POA: Insufficient documentation

## 2015-09-03 DIAGNOSIS — Z8611 Personal history of tuberculosis: Secondary | ICD-10-CM

## 2015-09-03 DIAGNOSIS — Z720 Tobacco use: Secondary | ICD-10-CM | POA: Insufficient documentation

## 2015-09-03 DIAGNOSIS — F323 Major depressive disorder, single episode, severe with psychotic features: Secondary | ICD-10-CM | POA: Diagnosis present

## 2015-09-03 DIAGNOSIS — R45851 Suicidal ideations: Secondary | ICD-10-CM | POA: Diagnosis present

## 2015-09-03 DIAGNOSIS — F329 Major depressive disorder, single episode, unspecified: Secondary | ICD-10-CM | POA: Insufficient documentation

## 2015-09-03 DIAGNOSIS — F332 Major depressive disorder, recurrent severe without psychotic features: Secondary | ICD-10-CM | POA: Insufficient documentation

## 2015-09-03 DIAGNOSIS — J45909 Unspecified asthma, uncomplicated: Secondary | ICD-10-CM | POA: Insufficient documentation

## 2015-09-03 DIAGNOSIS — G4700 Insomnia, unspecified: Secondary | ICD-10-CM | POA: Diagnosis present

## 2015-09-03 DIAGNOSIS — Y908 Blood alcohol level of 240 mg/100 ml or more: Secondary | ICD-10-CM | POA: Diagnosis present

## 2015-09-03 DIAGNOSIS — F333 Major depressive disorder, recurrent, severe with psychotic symptoms: Secondary | ICD-10-CM | POA: Diagnosis not present

## 2015-09-03 DIAGNOSIS — F32A Depression, unspecified: Secondary | ICD-10-CM

## 2015-09-03 LAB — RAPID URINE DRUG SCREEN, HOSP PERFORMED
AMPHETAMINES: NOT DETECTED
BARBITURATES: NOT DETECTED
Benzodiazepines: NOT DETECTED
Cocaine: NOT DETECTED
Opiates: NOT DETECTED
Tetrahydrocannabinol: NOT DETECTED

## 2015-09-03 LAB — I-STAT CHEM 8, ED
BUN: 7 mg/dL (ref 6–20)
CALCIUM ION: 1.2 mmol/L (ref 1.12–1.23)
Chloride: 101 mmol/L (ref 101–111)
Creatinine, Ser: 1.6 mg/dL — ABNORMAL HIGH (ref 0.61–1.24)
GLUCOSE: 68 mg/dL (ref 65–99)
HCT: 52 % (ref 39.0–52.0)
HEMOGLOBIN: 17.7 g/dL — AB (ref 13.0–17.0)
POTASSIUM: 3.8 mmol/L (ref 3.5–5.1)
Sodium: 143 mmol/L (ref 135–145)
TCO2: 27 mmol/L (ref 0–100)

## 2015-09-03 LAB — CBC WITH DIFFERENTIAL/PLATELET
Basophils Absolute: 0.1 10*3/uL (ref 0.0–0.1)
Basophils Relative: 1 %
Eosinophils Absolute: 1.1 10*3/uL — ABNORMAL HIGH (ref 0.0–0.7)
Eosinophils Relative: 8 %
HCT: 44.7 % (ref 39.0–52.0)
Hemoglobin: 16.3 g/dL (ref 13.0–17.0)
LYMPHS ABS: 5.9 10*3/uL — AB (ref 0.7–4.0)
LYMPHS PCT: 44 %
MCH: 28.5 pg (ref 26.0–34.0)
MCHC: 36.5 g/dL — ABNORMAL HIGH (ref 30.0–36.0)
MCV: 78.3 fL (ref 78.0–100.0)
MONO ABS: 1 10*3/uL (ref 0.1–1.0)
Monocytes Relative: 7 %
NEUTROS ABS: 5.4 10*3/uL (ref 1.7–7.7)
Neutrophils Relative %: 40 %
Platelets: 276 10*3/uL (ref 150–400)
RBC: 5.71 MIL/uL (ref 4.22–5.81)
RDW: 13.8 % (ref 11.5–15.5)
WBC: 13.5 10*3/uL — ABNORMAL HIGH (ref 4.0–10.5)

## 2015-09-03 LAB — ETHANOL: Alcohol, Ethyl (B): 259 mg/dL — ABNORMAL HIGH (ref <5)

## 2015-09-03 LAB — SALICYLATE LEVEL

## 2015-09-03 LAB — ACETAMINOPHEN LEVEL: Acetaminophen (Tylenol), Serum: <10 ug/mL — ABNORMAL LOW (ref 10–30)

## 2015-09-03 MED ORDER — CITALOPRAM HYDROBROMIDE 20 MG PO TABS
20.0000 mg | ORAL_TABLET | Freq: Every day | ORAL | Status: DC
  Administered 2015-09-03: 20 mg via ORAL
  Filled 2015-09-03 (×2): qty 1

## 2015-09-03 MED ORDER — ONDANSETRON HCL 4 MG PO TABS: 4.0000 mg | ORAL_TABLET | Freq: Three times a day (TID) | ORAL | Status: DC | PRN

## 2015-09-03 MED ORDER — ACETAMINOPHEN 325 MG PO TABS: 650.0000 mg | ORAL_TABLET | Freq: Once | ORAL | Status: DC

## 2015-09-03 MED ORDER — ALBUTEROL SULFATE HFA 108 (90 BASE) MCG/ACT IN AERS: 2.0000 | INHALATION_SPRAY | RESPIRATORY_TRACT | Status: DC | PRN

## 2015-09-03 MED ORDER — MAGNESIUM HYDROXIDE 400 MG/5ML PO SUSP: 30.0000 mL | Freq: Every day | ORAL | Status: DC | PRN

## 2015-09-03 MED ORDER — LORAZEPAM 1 MG PO TABS: 2.0000 mg | ORAL_TABLET | Freq: Four times a day (QID) | ORAL | Status: DC | PRN

## 2015-09-03 MED ORDER — LORAZEPAM 1 MG PO TABS
2.0000 mg | ORAL_TABLET | Freq: Four times a day (QID) | ORAL | Status: DC | PRN
  Administered 2015-09-03: 2 mg via ORAL
  Filled 2015-09-03: qty 2

## 2015-09-03 MED ORDER — IBUPROFEN 200 MG PO TABS: 600.0000 mg | ORAL_TABLET | Freq: Three times a day (TID) | ORAL | Status: DC | PRN

## 2015-09-03 MED ORDER — CITALOPRAM HYDROBROMIDE 20 MG PO TABS
20.0000 mg | ORAL_TABLET | Freq: Every day | ORAL | Status: DC
  Administered 2015-09-04 – 2015-09-05 (×2): 20 mg via ORAL
  Filled 2015-09-03 (×3): qty 1

## 2015-09-03 MED ORDER — ALUM & MAG HYDROXIDE-SIMETH 200-200-20 MG/5ML PO SUSP
30.0000 mL | ORAL | Status: DC | PRN
Start: 2015-09-03 — End: 2015-09-09

## 2015-09-03 MED ORDER — ALBUTEROL SULFATE HFA 108 (90 BASE) MCG/ACT IN AERS
2.0000 | INHALATION_SPRAY | RESPIRATORY_TRACT | Status: DC | PRN
Start: 2015-09-03 — End: 2015-09-09

## 2015-09-03 MED ORDER — TRAZODONE HCL 50 MG PO TABS
50.0000 mg | ORAL_TABLET | Freq: Every evening | ORAL | Status: DC | PRN
  Administered 2015-09-04 – 2015-09-08 (×6): 50 mg via ORAL
  Filled 2015-09-03 (×4): qty 1
  Filled 2015-09-03: qty 14
  Filled 2015-09-03 (×7): qty 1
  Filled 2015-09-03: qty 14
  Filled 2015-09-03 (×3): qty 1
  Filled 2015-09-03: qty 14

## 2015-09-03 MED ORDER — IBUPROFEN 600 MG PO TABS: 600.0000 mg | ORAL_TABLET | Freq: Three times a day (TID) | ORAL | Status: DC | PRN

## 2015-09-03 MED ORDER — TRAZODONE HCL 50 MG PO TABS: 50.0000 mg | ORAL_TABLET | Freq: Every evening | ORAL | Status: DC | PRN

## 2015-09-03 MED ORDER — ACETAMINOPHEN 325 MG PO TABS
650.0000 mg | ORAL_TABLET | Freq: Four times a day (QID) | ORAL | Status: DC | PRN
  Administered 2015-09-07 – 2015-09-08 (×2): 650 mg via ORAL
  Filled 2015-09-03 (×2): qty 2

## 2015-09-03 NOTE — ED Notes (Signed)
Pt reports he is done with life, SI no specific plan, denies HI or AV hallucinations.  Feeling hopeless. Pt reports he attempted SI x 6 mos ago by taking pills and alcohol.  Pt reports he lives with his significant other and daughter. Pt admits to abusing percocet, vicodin and alcohol.  AAO x3, no distress noted, calm & cooperative, monitoring for safety, Q 15 min checks in effect.  Monitoring for safety.

## 2015-09-03 NOTE — ED Notes (Signed)
Pt is alert and oriented.  C/o suicidal ideations.  Pt contracts for safety.  Spoke with pt. At length about the correlation between alcohol and drug abuse and depression.  Pt. Appears interested and engaged in conversation.  Video monitoring and 15 minute checks in place.

## 2015-09-03 NOTE — ED Provider Notes (Signed)
CSN: [id number]     Arrival date & time 09/03/15  0015 History   None    Chief Complaint  Patient presents with  . Suicidal     (Consider location/radiation/quality/duration/timing/severity/associated sxs/prior Treatment) HPI Comments: Patient states he is done with life and wishes it would end  It is too hard. His plan is ETOH or pills   The history is provided by the patient.    Past Medical History  Diagnosis Date  . Allergy   . Asthma   . Pulmonary tuberculosis    Past Surgical History  Procedure Laterality Date  . Tonsillectomy     History reviewed. No pertinent family history. Social History  Substance Use Topics  . Smoking status: Current Every Day Smoker -- 0.50 packs/day    Types: Cigarettes  . Smokeless tobacco: Never Used     Comment: 4 ciggs qd  . Alcohol Use: No    Review of Systems  Constitutional: Negative for fever and chills.  Gastrointestinal: Negative for nausea.  Neurological: Negative for dizziness and headaches.  Psychiatric/Behavioral: Positive for suicidal ideas. The patient is nervous/anxious.   All other systems reviewed and are negative.     Allergies  Cephalexin  Home Medications   Prior to Admission medications   Medication Sig Start Date End Date Taking? Authorizing Provider  albuterol (PROVENTIL HFA;VENTOLIN HFA) 108 (90 BASE) MCG/ACT inhaler Inhale 2 puffs into the lungs every 4 (four) hours as needed for wheezing or shortness of breath. 02/15/15   Sanjuana Kava, NP  citalopram (CELEXA) 20 MG tablet Take 1 tablet (20 mg total) by mouth daily. For depression 02/15/15   Sanjuana Kava, NP  traZODone (DESYREL) 50 MG tablet Take 1 tablet (50 mg total) by mouth at bedtime and may repeat dose one time if needed. For sleep 02/15/15   Sanjuana Kava, NP   BP 120/77 mmHg  Pulse 67  Temp(Src) 98 F (36.7 C) (Oral)  Resp 20  SpO2 100% Physical Exam  Constitutional: He appears well-developed and well-nourished.  HENT:  Head:  Normocephalic.  Eyes: Pupils are equal, round, and reactive to light.  Neck: Normal range of motion.  Cardiovascular: Normal rate.   Pulmonary/Chest: Effort normal.  Musculoskeletal: Normal range of motion.  Neurological: He is alert.  Psychiatric: His speech is normal and behavior is normal. Cognition and memory are normal. He expresses inappropriate judgment. He exhibits a depressed mood. He expresses suicidal ideation. He expresses suicidal plans.  Nursing note and vitals reviewed.   ED Course  Procedures (including critical care time) Labs Review Labs Reviewed  CBC WITH DIFFERENTIAL/PLATELET - Abnormal; Notable for the following:    WBC 13.5 (*)    MCHC 36.5 (*)    Lymphs Abs 5.9 (*)    Eosinophils Absolute 1.1 (*)    All other components within normal limits  ETHANOL - Abnormal; Notable for the following:    Alcohol, Ethyl (B) 259 (*)    All other components within normal limits  ACETAMINOPHEN LEVEL - Abnormal; Notable for the following:    Acetaminophen (Tylenol), Serum <10 (*)    All other components within normal limits  I-STAT CHEM 8, ED - Abnormal; Notable for the following:    Creatinine, Ser 1.60 (*)    Hemoglobin 17.7 (*)    All other components within normal limits  URINE RAPID DRUG SCREEN, HOSP PERFORMED  SALICYLATE LEVEL    Imaging Review No results found. I have personally reviewed and evaluated these images and lab  results as part of my medical decision-making.   EKG Interpretation None      MDM   Final diagnoses:  Depressed         Earley Favor, NP 09/03/15 1610  [name], MD 09/03/15 [phone]

## 2015-09-03 NOTE — Progress Notes (Signed)
Pt attended the evening AA speaker meeting. 

## 2015-09-03 NOTE — BH Assessment (Signed)
[facility] Assessment Progress Note  Per Thedore Mins, MD, this pt requires psychiatric hospitalization at this time.  Berneice Heinrich, RN, [facility] has assigned pt to [facility] Rm [id number].  Pt has signed Voluntary Admission and Consent for Treatment, as well as Consent to Release Information to no one, and signed forms have been faxed to [facility].  Pt's nurse, Kendal Hymen, has been notified, and agrees to send original paperwork along with pt via Juel Burrow, and to call report to [phone].  Doylene Canning, MA Triage Specialist [phone]

## 2015-09-03 NOTE — Progress Notes (Signed)
Pt seen by [facility] staff and given resources 

## 2015-09-03 NOTE — Consult Note (Addendum)
[facility] Face-to-Face Psychiatry Consult   Reason for Consult:  Alcohol use disorder, severe, Opioid Use disorder, moderate Referring Physician:  EDP Patient Identification: MOSIE [name] MRN:  [id number] Principal Diagnosis: Alcohol use disorder, severe, dependence Diagnosis:   Patient Active Problem List   Diagnosis Date Noted  . Alcohol use disorder, severe, dependence [F10.20] 02/13/2015    Priority: High  . 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 Cocaine use disorder, severe, dependence [F14.20] 02/13/2015  . Cannabis use disorder, severe, dependence [F12.20] 02/13/2015  . Alcohol withdrawal syndrome without complication [F10.230] 02/13/2015  . Opioid use disorder, severe, dependence [F11.20] 02/13/2015  . Opioid withdrawal [F11.23] 02/13/2015  . Depression [F32.9]   . Substance abuse [F19.10] 10/14/2014  . Opiate dependence, continuous [F11.20] 10/13/2014  . 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 Eyelid abnormality [H02.9] 03/12/2012  . DIABETES MELLITUS, TYPE II [E11.9] 11/10/2010  . ABSCESS, GLUTEAL [IMO0002] 10/20/2010  . CERVICAL LYMPHADENOPATHY [R59.1] 05/16/2010  . 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 VIRAL URI [J06.9] 10/02/2008  . PLEURAL EFFUSION, LEFT [J90] 03/22/2008  . DYSPNEA/SHORTNESS OF BREATH [R06.09, R09.89] 03/20/2008  . ALLERGIC RHINITIS [J30.9] 02/01/2008  . ASTHMA [J45.909] 02/01/2008  . FEVER, HX OF [Z91.89] 02/01/2008    Total Time spent with patient: 1 hour  Subjective:   Dalton [name] is a 29 y.o. male patient admitted with  Alcohol use disorder, severe, Opioid Use disorder, severe  HPI: AA male, 29 years old was evaluated for Alcohol intoxication and Opiate addiction.  He was hospitalized last last year November for detox treatment and was referred to [facility] in [place].  He reports that he relapsed 2 weeks after his discharge.  He reports drinking alcohol since age 87 and  states that he has had periods of short sobriety.  He uses Alcohol and opiates interchangeably.  He reports a diagnosis of depression and was supposed to take Celexa.  He has not been taking his Celexa but has been using Alcohol to self medicate.  His Alcohol level on arrival was 259.  He is not able to contract for safety.  He denies HI/AVH, he has been accepted for admission in our Observation unit.   Risk to Self: Suicidal Ideation: Yes-Currently Present Suicidal Intent: Yes-Currently Present Is patient at risk for suicide?: Yes Suicidal Plan?: Yes-Currently Present Specify Current Suicidal Plan:  (drive car off highway/into a tree) Access to Means: Yes Specify Access to Suicidal Means:  (has a vehicle) What has been your use of drugs/alcohol within the last 12 months?:  (opiates and alcohol) How many times?:  (0) Other Self Harm Risks:  (smacks and puches himself) Triggers for Past Attempts:  (no past attempts) Intentional Self Injurious Behavior: Bruising Risk to Others: Homicidal Ideation: No Thoughts of Harm to Others: No Current Homicidal Plan: No Access to Homicidal Means: No Identified Victim:  (none) History of harm to others?: No Assessment of Violence: None Noted Violent Behavior Description:  (none) Does patient have access to weapons?: No Does patient have a court date: No Prior Inpatient Therapy: Prior Inpatient Therapy: Yes Prior Therapy Dates:  (March 2016) Prior Therapy Facilty/Provider(s):  [facility]) Reason for Treatment:  (substance abuse/suicidal ideations) Prior Outpatient Therapy: Prior Outpatient Therapy: No Prior Therapy Dates:  (none) Prior Therapy Facilty/Provider(s):  (none) Reason for Treatment:  (n/a) Does patient have an ACCT team?: No Does patient have Intensive In-House Services?  : No Does patient have [facility] services? : No Does patient have P[facility]  services?: No  Past Medical History:  Past Medical History  Diagnosis Date  . Allergy   . Asthma    . Pulmonary tuberculosis     Past Surgical History  Procedure Laterality Date  . Tonsillectomy     Family History: History reviewed. No pertinent family history. Family Psychiatric  History:   Unknown, Patient does not know his family hx of Psychiatric illness. Social History:  History  Alcohol Use No     History  Drug Use No    Comment: former substance abuse    Social History   Social History  . Marital Status: Single    Spouse Name: N/A  . Number of Children: N/A  . Years of Education: N/A   Social History Main Topics  . Smoking status: Current Every Day Smoker -- 0.50 packs/day    Types: Cigarettes  . Smokeless tobacco: Never Used     Comment: 4 ciggs qd  . Alcohol Use: No  . Drug Use: No     Comment: former substance abuse  . Sexual Activity: Not Asked   Other Topics Concern  . None   Social History Narrative   Additional Social History:      Allergies:   Allergies  Allergen Reactions  . Cephalexin     Pt states he is not allergic to med    Labs:  Results for orders placed or performed during the hospital encounter of 09/03/15 (from the past 48 hour(s))  CBC with Differential     Status: Abnormal   Collection Time: 09/03/15 12:53 AM  Result Value Ref Range   WBC 13.5 (H) 4.0 - 10.5 K/uL   RBC 5.71 4.22 - 5.81 MIL/uL   Hemoglobin 16.3 13.0 - 17.0 g/dL   HCT 16.1 09.6 - 04.5 %   MCV 78.3 78.0 - 100.0 fL   MCH 28.5 26.0 - 34.0 pg   MCHC 36.5 (H) 30.0 - 36.0 g/dL   RDW 40.9 81.1 - 91.4 %   Platelets 276 150 - 400 K/uL   Neutrophils Relative % 40 %   Neutro Abs 5.4 1.7 - 7.7 K/uL   Lymphocytes Relative 44 %   Lymphs Abs 5.9 (H) 0.7 - 4.0 K/uL   Monocytes Relative 7 %   Monocytes Absolute 1.0 0.1 - 1.0 K/uL   Eosinophils Relative 8 %   Eosinophils Absolute 1.1 (H) 0.0 - 0.7 K/uL   Basophils Relative 1 %   Basophils Absolute 0.1 0.0 - 0.1 K/uL  I-stat chem 8, ed     Status: Abnormal   Collection Time: 09/03/15 12:53 AM  Result Value Ref Range    Sodium 143 135 - 145 mmol/L   Potassium 3.8 3.5 - 5.1 mmol/L   Chloride 101 101 - 111 mmol/L   BUN 7 6 - 20 mg/dL   Creatinine, Ser 7.82 (H) 0.61 - 1.24 mg/dL   Glucose, Bld 68 65 - 99 mg/dL   Calcium, Ion 9.56 1.12 - 1.23 mmol/L   TCO2 27 0 - 100 mmol/L   Hemoglobin 17.7 (H) 13.0 - 17.0 g/dL   HCT 21.3 08.6 - 57.8 %  Ethanol     Status: Abnormal   Collection Time: 09/03/15 12:53 AM  Result Value Ref Range   Alcohol, Ethyl (B) 259 (H) <5 mg/dL    Comment:        LOWEST DETECTABLE LIMIT FOR SERUM ALCOHOL IS 5 mg/dL FOR MEDICAL PURPOSES ONLY   Salicylate level     Status: None  Collection Time: 09/03/15 12:53 AM  Result Value Ref Range   Salicylate Lvl <4.0 2.8 - 30.0 mg/dL  Acetaminophen level     Status: Abnormal   Collection Time: 09/03/15 12:53 AM  Result Value Ref Range   Acetaminophen (Tylenol), Serum <10 (L) 10 - 30 ug/mL    Comment:        THERAPEUTIC CONCENTRATIONS VARY SIGNIFICANTLY. A RANGE OF 10-30 ug/mL MAY BE AN EFFECTIVE CONCENTRATION FOR MANY PATIENTS. HOWEVER, SOME ARE BEST TREATED AT CONCENTRATIONS OUTSIDE THIS RANGE. ACETAMINOPHEN CONCENTRATIONS >150 ug/mL AT 4 HOURS AFTER INGESTION AND >50 ug/mL AT 12 HOURS AFTER INGESTION ARE OFTEN ASSOCIATED WITH TOXIC REACTIONS.   Urine rapid drug screen (hosp performed)     Status: None   Collection Time: 09/03/15 12:59 AM  Result Value Ref Range   Opiates NONE DETECTED NONE DETECTED   Cocaine NONE DETECTED NONE DETECTED   Benzodiazepines NONE DETECTED NONE DETECTED   Amphetamines NONE DETECTED NONE DETECTED   Tetrahydrocannabinol NONE DETECTED NONE DETECTED   Barbiturates NONE DETECTED NONE DETECTED    Comment:        DRUG SCREEN FOR MEDICAL PURPOSES ONLY.  IF CONFIRMATION IS NEEDED FOR ANY PURPOSE, NOTIFY LAB WITHIN 5 DAYS.        LOWEST DETECTABLE LIMITS FOR URINE DRUG SCREEN Drug Class       Cutoff (ng/mL) Amphetamine      1000 Barbiturate      200 Benzodiazepine   200 Tricyclics        300 Opiates          300 Cocaine          300 THC              50     Current Facility-Administered Medications  Medication Dose Route Frequency Provider Last Rate Last Dose  . albuterol (PROVENTIL HFA;VENTOLIN HFA) 108 (90 BASE) MCG/ACT inhaler 2 puff  2 puff Inhalation Q4H PRN Earley Favor, NP      . citalopram (CELEXA) tablet 20 mg  20 mg Oral Daily Earley Favor, NP      . ibuprofen (ADVIL,MOTRIN) tablet 600 mg  600 mg Oral Q8H PRN Earley Favor, NP      . ondansetron [facility]) tablet 4 mg  4 mg Oral Q8H PRN Earley Favor, NP      . traZODone (DESYREL) tablet 50 mg  50 mg Oral QHS,MR X 1 Earley Favor, NP       Current Outpatient Prescriptions  Medication Sig Dispense Refill  . Oxycodone-Acetaminophen (PERCOCET PO) Take 5-8 tablets by mouth.      Musculoskeletal: Strength & Muscle Tone: within normal limits Gait & Station: normal Patient leans: N/A  Psychiatric Specialty Exam: Review of Systems  Constitutional: Negative.   HENT: Negative.   Eyes: Negative.   Respiratory: Negative.   Cardiovascular: Negative.   Gastrointestinal: Negative.   Genitourinary: Negative.   Musculoskeletal: Negative.   Skin: Negative.   Neurological: Negative.   Endo/Heme/Allergies: Negative.   See PMH as documented, nothing acute reported.  Blood pressure 91/61, pulse 80, temperature 98.1 F (36.7 C), temperature source Oral, resp. rate 16, SpO2 100 %.There is no weight on file to calculate BMI.  General Appearance: Casual  Eye Contact::  Good  Speech:  Clear and Coherent and Normal Rate  Volume:  Normal  Mood:  Anxious and Depressed  Affect:  Congruent, Depressed and Flat  Thought Process:  Coherent, Goal Directed and Intact  Orientation:  Full (Time, Place, and Person)  Thought Content:  WDL  Suicidal Thoughts:  Yes.  with intent/plan  Homicidal Thoughts:  No  Memory:  Immediate;   Good Recent;   Good Remote;   Good  Judgement:  Impaired  Insight:  Shallow  Psychomotor Activity:  Normal   Concentration:  Fair  Recall:  NA  Fund of Knowledge:Fair  Language: Good  Akathisia:  NA  Handed:  Right  AIMS (if indicated):     Assets:  Desire for Improvement  ADL's:  Intact  Cognition: WNL  Sleep:      Treatment Plan Summary: Daily contact with patient to assess and evaluate symptoms and progress in treatment and Medication management  Disposition: Admit to Observatio unit, initiate Alcohol detox protocol using Ativan and resume home medications.  Earney Navy  PMHNP-BC 09/03/2015 11:01 AM Patient seen face-to-face for psychiatric evaluation, chart reviewed and case discussed with the physician extender and developed treatment plan. Reviewed the information documented and agree with the treatment plan. Thedore Mins, MD

## 2015-09-03 NOTE — ED Notes (Signed)
Pt states that he's done with life and wants to end it all, he's tried it before with pills and alcohol, tonight he states he drank

## 2015-09-03 NOTE — BH Assessment (Addendum)
Assessment Note  Dalton [name] is an 29 y.o. male who presented to [facility] with increasing suicidal ideations and multiple plans.  He reported a 5 year history of opiate and alcohol abuse.  He stated that he drinks 2 40 oz beers daily and takes 75. - 100 mg opiates (percocet or vicodin)  when he has access to them.  Patient attended in patient in [place] for substance abuse in November of last year and was in patient at [facility] in March of this year.  He currently reports feelings of sadness, crying, anxiety, problems concentrating, difficulty with his long term memory, irritability, and only sleeps if he is using a substance or drinking alcohol.  Patient stated that he frequently "smacks or punches" himself and believes that he has "messed up" his life.  He reported that his life is "spiraling out of control" and he is feeling "hopeless"Patient does not have any past suicide attempts but stated that he thinks about suicide often.  He reported that when he is driving he thinks about running his car off of the highway or into a tree. He reported that due to his drug and alcohol abuse he has lost his job and has come into financial difficulty.  He was caught stealing at [employer] and is now facing a Engineer, drilling.    Patient denied any homicidal ideations, past history of aggressive, hallucinations, or any outpatient treatment.   Consulted with Dr. Mervyn Skeeters and Julieanne Cotton who recommended inpatient treatment   Diagnosis:  Axis I Opiod Use Disorder severe Alcohol use Disorder severe, Substance Induced Depressive Disorder                      Axis II:  Deferred         Axis III:  See below         Axis IV economic problems, problems with access to health care services         Axis V:  41-50 Serious Symptoms  Past Medical History:  Past Medical History  Diagnosis Date  . Allergy   . Asthma   . Pulmonary tuberculosis     Past Surgical History  Procedure Laterality Date  . Tonsillectomy      Family History:  History reviewed. No pertinent family history.  Social History:  reports that he has been smoking Cigarettes.  He has been smoking about 0.50 packs per day. He has never used smokeless tobacco. He reports that he does not drink alcohol or use illicit drugs.  Additional Social History:     CIWA: CIWA-Ar BP: 91/61 mmHg Pulse Rate: 80 COWS:    Allergies:  Allergies  Allergen Reactions  . Cephalexin     REACTION: nausea, vomiting    Home Medications:  (Not in a hospital admission)  OB/GYN Status:  No LMP for male patient.  General Assessment Data Location of Assessment: [facility] TTS Assessment: In system Is this a Tele or Face-to-Face Assessment?: Face-to-Face Is this an Initial Assessment or a Re-assessment for this encounter?: Initial Assessment Marital status: Single Maiden name:  (n/a) Is patient pregnant?: No Pregnancy Status: No Living Arrangements: Spouse/significant other Can pt return to current living arrangement?: Yes Admission Status: Voluntary Is patient capable of signing voluntary admission?: Yes Referral Source: MD Insurance type:  (none)  Medical Screening Exam [facility] Walk-in ONLY) Medical Exam completed: Yes  Crisis Care Plan Living Arrangements: Spouse/significant other Name of Psychiatrist:  (none) Name of Therapist:  (none)  Education Status Is  patient currently in school?: No Current Grade:  (n/a) Highest grade of school patient has completed:  (AS degree) Name of school:  (n/a) Contact person:  (n/a)  Risk to self with the past 6 months Suicidal Ideation: Yes-Currently Present Has patient been a risk to self within the past 6 months prior to admission? : Yes Suicidal Intent: Yes-Currently Present Has patient had any suicidal intent within the past 6 months prior to admission? : Yes Is patient at risk for suicide?: Yes Suicidal Plan?: Yes-Currently Present Has patient had any suicidal plan within the past 6 months prior to admission? :  Yes Specify Current Suicidal Plan:  (drive car off highway/into a tree) Access to Means: Yes Specify Access to Suicidal Means:  (has a vehicle) What has been your use of drugs/alcohol within the last 12 months?:  (opiates and alcohol) Previous Attempts/Gestures: No How many times?:  (0) Other Self Harm Risks:  (smacks and puches himself) Triggers for Past Attempts:  (no past attempts) Intentional Self Injurious Behavior: Bruising Family Suicide History: No Recent stressful life event(s): Job Loss, Financial Problems Persecutory voices/beliefs?: Yes Depression: Yes Depression Symptoms: Despondent, Insomnia, Tearfulness, Isolating, Fatigue, Guilt, Loss of interest in usual pleasures, Feeling worthless/self pity, Feeling angry/irritable Substance abuse history and/or treatment for substance abuse?: Yes Suicide prevention information given to non-admitted patients: Not applicable  Risk to Others within the past 6 months Homicidal Ideation: No Does patient have any lifetime risk of violence toward others beyond the six months prior to admission? : No Thoughts of Harm to Others: No Current Homicidal Plan: No Access to Homicidal Means: No Identified Victim:  (none) History of harm to others?: No Assessment of Violence: None Noted Violent Behavior Description:  (none) Does patient have access to weapons?: No Does patient have a court date: No Is patient on probation?: No  Psychosis Hallucinations: None noted Delusions: None noted  Mental Status Report Appearance/Hygiene: In scrubs Eye Contact: Poor Motor Activity: Psychomotor retardation Speech: Logical/coherent Level of Consciousness: Quiet/awake Mood: Depressed Affect: Depressed Anxiety Level: Minimal Thought Processes: Coherent, Relevant Judgement: Impaired Orientation: Person, Place, Time, Situation Obsessive Compulsive Thoughts/Behaviors: None  Cognitive Functioning Concentration: Decreased Memory: Recent Intact,  Remote Impaired IQ: Average Impulse Control: Poor Appetite: Poor Weight Loss:  (10 - 15 pounds in couple months)  ADLScreening [facility] Assessment Services) Patient's cognitive ability adequate to safely complete daily activities?: Yes Patient able to express need for assistance with ADLs?: Yes Independently performs ADLs?: Yes (appropriate for developmental age)  Prior Inpatient Therapy Prior Inpatient Therapy: Yes Prior Therapy Dates:  (March 2016) Prior Therapy Facilty/Provider(s):  [facility]) Reason for Treatment:  (substance abuse/suicidal ideations)  Prior Outpatient Therapy Prior Outpatient Therapy: No Prior Therapy Dates:  (none) Prior Therapy Facilty/Provider(s):  (none) Reason for Treatment:  (n/a) Does patient have an ACCT team?: No Does patient have Intensive In-House Services?  : No Does patient have [facility] services? : No Does patient have P4CC services?: No  ADL Screening (condition at time of admission) Patient's cognitive ability adequate to safely complete daily activities?: Yes Is the patient deaf or have difficulty hearing?: No Does the patient have difficulty seeing, even when wearing glasses/contacts?: No Does the patient have difficulty concentrating, remembering, or making decisions?: Yes Patient able to express need for assistance with ADLs?: Yes Does the patient have difficulty dressing or bathing?: No Independently performs ADLs?: Yes (appropriate for developmental age) Does the patient have difficulty walking or climbing stairs?: No Weakness of Legs: None Weakness of Arms/Hands: None  Home  Assistive Devices/Equipment Home Assistive Devices/Equipment: None  Therapy Consults (therapy consults require a physician order) PT Evaluation Needed: No OT Evalulation Needed: No SLP Evaluation Needed: No Abuse/Neglect Assessment (Assessment to be complete while patient is alone) Physical Abuse: Denies Verbal Abuse: Denies Sexual Abuse: Denies Exploitation of  patient/patient's resources: Denies Self-Neglect: Denies Values / Beliefs Cultural Requests During Hospitalization: None Spiritual Requests During Hospitalization: None Consults Spiritual Care Consult Needed: No Social Work Consult Needed: No Merchant navy officer (For Healthcare) Does patient have an advance directive?: No Would patient like information on creating an advanced directive?: No - patient declined information    Additional Information 1:1 In Past 12 Months?: No CIRT Risk: No Elopement Risk: No Does patient have medical clearance?: Yes     Disposition:  Disposition Initial Assessment Completed for this Encounter: Yes Disposition of Patient: Inpatient treatment program Type of inpatient treatment program: Adult  On Site Evaluation by:   Reviewed with Physician:    Annetta Maw 09/03/2015 9:36 AM

## 2015-09-03 NOTE — Progress Notes (Signed)
Patient decided to have personal belongings taken home by family. Writer informed the patient and family member that if he is transferred to another facility, they will have to bring his belongings to the new facility. Patient understood and signed the patient belong sheet to release clothing to family. 09-03-15 1236

## 2015-09-03 NOTE — ED Notes (Signed)
Pt discharged ambulatory.  Safely with [place] driver.  Belongings were sent home earlier with mother who will take to [facility]

## 2015-09-03 NOTE — Progress Notes (Signed)
Search Note: Vital signs obtained.  Skin assessed and intact.  Tattoos noted on both arms, chest and back between shoulder blades.  No personal belonging.  Patient introduced to unit and room.  Patient denies auditory and visual hallucinations.  Denies thought to hurt self or others.  No complain of pain or discomfort reported.

## 2015-09-03 NOTE — ED Notes (Signed)
Pt belongings is located at nurse station at [facility]

## 2015-09-03 NOTE — BHH Counselor (Addendum)
Called to set up TA assessment.  Per nurse Victorino Dike, pt is in the process of transferring to [facility].  Will have to wait on assessment so they can get him settled in [facility].  Once at [facility], attempted to assess pt with staff John at bedside to help pt stay awake.  Pt would not wake up at all.  Called  NP to ask that TTS Consult be canceled and re-requested when pt is awake and ready to be assessed.   Beryle Flock, MS, CRC, [facility] Triage Specialist [facility]

## 2015-09-04 ENCOUNTER — Encounter (HOSPITAL_COMMUNITY): Payer: Self-pay

## 2015-09-04 DIAGNOSIS — R45851 Suicidal ideations: Secondary | ICD-10-CM

## 2015-09-04 DIAGNOSIS — F323 Major depressive disorder, single episode, severe with psychotic features: Secondary | ICD-10-CM | POA: Diagnosis present

## 2015-09-04 DIAGNOSIS — F1020 Alcohol dependence, uncomplicated: Secondary | ICD-10-CM

## 2015-09-04 DIAGNOSIS — F332 Major depressive disorder, recurrent severe without psychotic features: Secondary | ICD-10-CM

## 2015-09-04 MED ORDER — QUETIAPINE FUMARATE ER 50 MG PO TB24
50.0000 mg | ORAL_TABLET | Freq: Once | ORAL | Status: AC
  Administered 2015-09-04: 50 mg via ORAL
  Filled 2015-09-04 (×2): qty 1

## 2015-09-04 MED ORDER — HYDROXYZINE HCL 25 MG PO TABS
25.0000 mg | ORAL_TABLET | Freq: Four times a day (QID) | ORAL | Status: AC | PRN
  Administered 2015-09-06: 25 mg via ORAL
  Filled 2015-09-04: qty 1

## 2015-09-04 MED ORDER — PNEUMOCOCCAL VAC POLYVALENT 25 MCG/0.5ML IJ INJ
0.5000 mL | INJECTION | INTRAMUSCULAR | Status: AC
  Administered 2015-09-05: 0.5 mL via INTRAMUSCULAR

## 2015-09-04 MED ORDER — ONDANSETRON 4 MG PO TBDP: 4.0000 mg | ORAL_TABLET | Freq: Four times a day (QID) | ORAL | Status: DC | PRN

## 2015-09-04 MED ORDER — INFLUENZA VAC SPLIT QUAD 0.5 ML IM SUSY
0.5000 mL | PREFILLED_SYRINGE | INTRAMUSCULAR | Status: AC
  Administered 2015-09-05: 0.5 mL via INTRAMUSCULAR
  Filled 2015-09-04: qty 0.5

## 2015-09-04 MED ORDER — LORAZEPAM 1 MG PO TABS
1.0000 mg | ORAL_TABLET | Freq: Two times a day (BID) | ORAL | Status: AC
  Administered 2015-09-06 (×2): 1 mg via ORAL
  Filled 2015-09-04 (×2): qty 1

## 2015-09-04 MED ORDER — LOPERAMIDE HCL 2 MG PO CAPS: 2.0000 mg | ORAL_CAPSULE | ORAL | Status: AC | PRN

## 2015-09-04 MED ORDER — LORAZEPAM 1 MG PO TABS
1.0000 mg | ORAL_TABLET | Freq: Three times a day (TID) | ORAL | Status: AC
  Administered 2015-09-05 (×3): 1 mg via ORAL
  Filled 2015-09-04 (×4): qty 1

## 2015-09-04 MED ORDER — LORAZEPAM 1 MG PO TABS
1.0000 mg | ORAL_TABLET | Freq: Four times a day (QID) | ORAL | Status: AC
  Administered 2015-09-04 (×3): 1 mg via ORAL
  Filled 2015-09-04 (×2): qty 1

## 2015-09-04 MED ORDER — LORAZEPAM 1 MG PO TABS: 1.0000 mg | ORAL_TABLET | Freq: Four times a day (QID) | ORAL | Status: AC | PRN

## 2015-09-04 MED ORDER — LORAZEPAM 1 MG PO TABS
1.0000 mg | ORAL_TABLET | Freq: Every day | ORAL | Status: AC
  Administered 2015-09-07: 1 mg via ORAL
  Filled 2015-09-04: qty 1

## 2015-09-04 NOTE — Progress Notes (Signed)
Pt did not attend NA group this evening.  

## 2015-09-04 NOTE — BHH Group Notes (Signed)
BHH LCSW Group Therapy  09/04/2015 1:07 PM  Type of Therapy:  Group Therapy  Participation Level:  Did Not Attend-pt chose to remain in bed.   Summary of Progress/Problems: Today's Topic: Overcoming Obstacles. Patients identified one short term goal and potential obstacles in reaching this goal. Patients processed barriers involved in overcoming these obstacles. Patients identified steps necessary for overcoming these obstacles and explored motivation (internal and external) for facing these difficulties head on.   Smart, Heather LCSWA 09/04/2015, 1:07 PM

## 2015-09-04 NOTE — Plan of Care (Signed)
Problem: Diagnosis: Increased Risk For Suicide Attempt Goal: LTG-Patient Will Report Improved Mood and Deny Suicidal LTG (by discharge) Patient will report improved mood and deny suicidal ideation.  Outcome: Progressing Pt denied SI and verbally contracted for safety.

## 2015-09-04 NOTE — Progress Notes (Signed)
D: Pt continues to be very flat and depressed on the unit today. Pt also continues to be very isolative and been in the bed most of the morning hour. Pt asked is he was ok, pt stated he was fine, just feeling tired. Pt reported having had a fair night sleep, poor appetite, low energy level, and poor concentration. Pt denied any physical pain but been cooperative with his medications. Pt reported that his depression was a 10, his hopelessness was a 10, and that his anxiety was a 05. Pt reported being negative SI/HI, no AH/VH noted. A: 15 min checks continued for patient safety. R: Pts safety maintained.

## 2015-09-04 NOTE — BHH Counselor (Addendum)
Adult Comprehensive Assessment  Patient ID: Dalton [name], male   DOB: July 22, 1986, 29 y.o.   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 Family Relationships: concerned that his "pill habit" has been negative influence on his girlfriend Surveyor, quantity / Lack of resources (include bankruptcy): owes IRS, court fees, Publishing copy / Lack of housing: stable Physical health (include injuries & life threatening diseases): no concerns Social relationships: long term girlfriend Substance abuse: current pill abuser, drinks 24 oz beer/day Bereavement / Loss: none noted  Living/Environment/Situation:  Living Arrangements: Spouse/significant other Living conditions (as described by patient or guardian): stable housing w girlfriend and her 7 year old daughter, goes back and forth to parents as well after arguments How long has patient lived in current situation?: 6 years What is atmosphere in current home: Supportive  Family History:  Marital status: Single Does patient have children?: No  Childhood History:  By whom was/is the patient raised?: Mother Description of patient's relationship with caregiver when they were a child: Good w mother, absent father, relationship w stepfather has improved as patient aged Patient's description of current relationship with people who raised him/her: Good w mother, has only seen father once in his life. Father in [place], has reached out to reconnect w patient approx 2 years ago after father was diagnosed w cancer, patient says he is developing relationsihip w father Does patient have siblings?: Yes Number of Siblings: 2 Description of patient's current relationship with siblings: Younger sister and stepbrother.  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 Lost job.  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 About to get dropped from insurance due to recent job loss. fines and court fees for legal charges here and in [facility]) Does patient have a representative payee or guardian?: No  Alcohol/Substance Abuse:  What has been your use of drugs/alcohol within the last 12 months?: drinks 2 40 oz beers daily and takes 75. - 100 mg opiates (percocet or vicodin) Alcohol/Substance Abuse Treatment Hx: Past Tx, Inpatient If yes, describe treatment: Was at [facility] for 28 day program after hospitalization at [facility] in 10/2014 and March 2016.  Has alcohol/substance abuse ever caused legal problems?: Yes (arrested for possession of small amount of marijuana last year in [place]). Larceny charge-stealing from [place] recently.   Social Support System:  Patient's Community Support System: Fair Describe Community Support System: Good relationship w long term girlfriend Type of faith/religion: Ephriam Knuckles How  does patient's faith help to cope with current illness?: says thats the only way that he can "get over this addiction"  Leisure/Recreation:  Leisure and Hobbies: basketball, sports, spending time w family  Strengths/Needs:  What things does the patient do well?: talking w people about their problems, likes to listen and give wisdom to others In what areas does patient struggle / problems for patient: addiction  Discharge Plan:  Does patient have access to transportation?: Yes Will patient be returning to same living situation after discharge?: Yes Currently receiving community mental health services: No. If no, would patient like referral for services when discharged?:ADS possibly due to insurance lapse that will occur soon (recent job loss) Does patient have financial barriers related to discharge medications?: Yes Patient description of barriers related to discharge medications:limited income.   Summary/Recommendations:  Dalton [name] is an 29 y.o. male who presented to [facility] with increasing suicidal ideations and multiple plans. He reported a 5 year history of opiate and alcohol abuse. He stated that he drinks 2 40 oz beers daily and takes 75. - 100 mg opiates (percocet or vicodin) when he has access to them. Patient attended in patient in [place] for substance abuse in November of last year and was in patient at [facility] in March of this year. He currently reports feelings of sadness, crying, anxiety, problems concentrating, difficulty with his long term memory, irritability, and only sleeps if he is using a substance or drinking alcohol. Patient stated that he frequently "smacks or punches" himself and believes that he has "messed up" his life. He reported that his life is "spiraling out of control" and he is feeling "hopeless"Patient does not have any past suicide attempts but stated that he thinks about suicide often. He reported that when he is driving he thinks about running his  car off of the highway or into a tree. He reported that due to his drug and alcohol abuse he has lost his job and has come into financial difficulty. He was caught stealing at [place] and is now facing a Engineer, drilling. Patient denied any homicidal ideations, past history of aggressive, hallucinations, or any outpatient treatment.  Recommendations for pt include: crisis stabilization, therapeutic milieu, encourage group attendance and participation, medication management, and development of comprehensive mental wellness/sobriety plan.  Patient will develop appropriate coping skills for dealing w overwhelming emotions, stabilize on medications if appropriate, and develop greater insight into and acceptance of his addiction. CSWs will develop discharge plan to include family support and referral to appropriate after care services, patient requests [facility] if available to him.    Trula Slade Kelsey [facility] 09/04/2015 8:36 AM

## 2015-09-04 NOTE — Progress Notes (Signed)
Patient ID: Dalton [name], male   DOB: November 21, 1986, 29 y.o.   MRN: [id number] D: Patient in dayroom on approach. Pt mood and affect appeared depressed and flat. Pt reports drinking 3 or 4 40s daily and using cocaine once a month. Pt stated he needs help with his depression. report this is his third admission this year that [facility]. Pt denies SI/HI/AVH and pain.  Skin, belongings search and consent signed by pt with previous nurse. Cooperative with assessment.   A: Met with pt 1:1. Medications administered as prescribed. Support and encouragement provided to attend groups and engage in milieu.   R: Patient remains safe and complaint with medication.

## 2015-09-04 NOTE — Tx Team (Signed)
Interdisciplinary Treatment Plan Update (Adult)  Date:  09/04/2015  Time Reviewed:  8:36 AM   Progress in Treatment: Attending groups: Yes. Participating in groups:  Yes. Taking medication as prescribed:  Yes. Tolerating medication:  Yes. Family/Significant othe contact made:  SPE required for this pt.  Patient understands diagnosis:  Yes. and As evidenced by:  seeking treatment for depression; minimizing substance abuse-alcohol/opiates.  Discussing patient identified problems/goals with staff:  Yes. Medical problems stabilized or resolved:  Yes. Denies suicidal/homicidal ideation: Yes. Issues/concerns per patient self-inventory:  Other:  New problem(s) identified:    Discharge Plan or Barriers:   Reason for Continuation of Hospitalization: Depression Medication stabilization Withdrawal symptoms  Comments:  Dalton [name] is an 29 y.o. male who presented to [facility] with increasing suicidal ideations and multiple plans. He reported a 5 year history of opiate and alcohol abuse. He stated that he drinks 2 40 oz beers daily and takes 75. - 100 mg opiates (percocet or vicodin) when he has access to them.Patient attended inpatient in [place] for substance abuse in November of last year and was in patient at [facility] in March of this year. He currently reports feelings of sadness, crying, anxiety, problems concentrating, difficulty with his long term memory, irritability, and only sleeps if he is using a substance or drinking alcohol. Patient stated that he frequently "smacks or punches" himself and believes that he has "messed up" his life. He reported that his life is "spiraling out of control" and he is feeling "hopeless"Patient does not have any past suicide attempts but stated that he thinks about suicide often. He reported that when he is driving he thinks about running his car off of the highway or into a tree. He reported that due to his drug and alcohol abuse he has lost his job and has  come into financial difficulty. He was caught stealing at [place] and is now facing a Scientist, research (life sciences). Diagnosis: Axis I Opiod Use Disorder severe Alcohol use Disorder severe, Substance Induced Depressive Disorder  Estimated length of stay:  3-5 days   New goal(s): to develop effective aftercare plan.   Additional Comments:  Patient and CSW reviewed pt's identified goals and treatment plan. Patient verbalized understanding and agreed to treatment plan. CSW reviewed [facility] "Discharge Process and Patient Involvement" Form. Pt verbalized understanding of information provided and signed form.    Review of initial/current patient goals per problem list:  1. Goal(s): Patient will participate in aftercare plan  Met: No.   Target date: at discharge  As evidenced by: Patient will participate within aftercare plan AEB aftercare provider and housing plan at discharge being identified.  9/28: CSW assessing for appropriate referrals.   2. Goal (s): Patient will exhibit decreased depressive symptoms and suicidal ideations.  Met: No.    Target date: at discharge  As evidenced by: Patient will utilize self rating of depression at 3 or below and demonstrate decreased signs of depression or be deemed stable for discharge by MD.  9/28: Pt rates depression as 10 and presents with depressed mood and flat affect. Denies SI/HI/AVH.   4. Goal(s): Patient will demonstrate decreased signs of withdrawal due to substance abuse  Met: Yes  Target date:at discharge   As evidenced by: Patient will produce a CIWA/COWS score of 0, have stable vitals signs, and no symptoms of withdrawal.  9/28: Pt denies withdrawal symptoms. Stable vitals. No CIWA/COWS score.    Attendees: Patient:   09/04/2015 8:36 AM   Family:   09/04/2015  8:36 AM   Physician:  Dr. Carlton Adam, MD 09/04/2015 8:36 AM   Nursing:   Ileene Rubens RN 09/04/2015 8:36 AM   Clinical Social Worker: Maxie Better, [place]  09/04/2015 8:36 AM    Clinical Social Worker:  Peri Maris LCSWA 09/04/2015 8:36 AM   Other:  Gerline Legacy Nurse Case Manager 09/04/2015 8:36 AM   Other:  Lucinda Dell; [facility]  09/04/2015 8:36 AM   Other:   09/04/2015 8:36 AM   Other:  09/04/2015 8:36 AM   Other:  09/04/2015 8:36 AM   Other:  09/04/2015 8:36 AM    09/04/2015 8:36 AM    09/04/2015 8:36 AM    09/04/2015 8:36 AM    09/04/2015 8:36 AM    Scribe for Treatment Team:   Maxie Better, [place]  09/04/2015 8:36 AM

## 2015-09-04 NOTE — Tx Team (Signed)
Initial Interdisciplinary Treatment Plan   PATIENT STRESSORS: Financial difficulties Health problems Substance abuse   PATIENT STRENGTHS: Ability for insight Average or above average intelligence General fund of knowledge Motivation for treatment/growth   PROBLEM LIST: Problem List/Patient Goals Date to be addressed Date deferred Reason deferred Estimated date of resolution  depression 09/03/2015     anxiety 09/03/2015     Substance abuse 09/03/2015     Risk for suicide 09/03/2015     "I need help with my depression" 09/03/2015                              DISCHARGE CRITERIA:  Adequate post-discharge living arrangements Improved stabilization in mood, thinking, and/or behavior Withdrawal symptoms are absent or subacute and managed without 24-hour nursing intervention  PRELIMINARY DISCHARGE PLAN: Attend PHP/IOP Attend 12-step recovery group Return to previous living arrangement Return to previous work or school arrangements  PATIENT/FAMIILY INVOLVEMENT: This treatment plan has been presented to and reviewed with the patient, Dalton [name], The patient and family have been given the opportunity to ask questions and make suggestions.  JEHU-APPIAH, LINDA K 09/04/2015, 2:59 AM

## 2015-09-04 NOTE — H&P (Signed)
Psychiatric Admission Assessment Adult  Patient Identification: Dalton [name] MRN:  [id number] Date of Evaluation:  09/04/2015 Chief Complaint:  ALCOHOL USE DISORDER,SEVERE OPIOID USE DISORDER,MODERATE Principal Diagnosis: <principal problem not specified> Diagnosis:   Patient Active Problem List   Diagnosis Date Noted  . Major depressive disorder, recurrent, severe without psychotic features [F33.2]   . 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 Alcohol use disorder, severe, dependence [F10.20] 02/13/2015  . Cocaine use disorder, severe, dependence [F14.20] 02/13/2015  . Cannabis use disorder, severe, dependence [F12.20] 02/13/2015  . Alcohol withdrawal syndrome without complication [F10.230] 02/13/2015  . Opioid use disorder, severe, dependence [F11.20] 02/13/2015  . Opioid withdrawal [F11.23] 02/13/2015  . Depression [F32.9]   . Substance abuse [F19.10] 10/14/2014  . Opiate dependence, continuous [F11.20] 10/13/2014  . 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 Eyelid abnormality [H02.9] 03/12/2012  . DIABETES MELLITUS, TYPE II [E11.9] 11/10/2010  . ABSCESS, GLUTEAL [IMO0002] 10/20/2010  . CERVICAL LYMPHADENOPATHY [R59.1] 05/16/2010  . 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 Dalton URI [J06.9] 10/02/2008  . PLEURAL EFFUSION, LEFT [J90] 03/22/2008  . DYSPNEA/SHORTNESS OF BREATH [R06.09, R09.89] 03/20/2008  . ALLERGIC RHINITIS [J30.9] 02/01/2008  . ASTHMA [J45.909] 02/01/2008  . FEVER, HX OF [Z91.89] 02/01/2008   History of Present Illness:: 29 Y/o male who states he has substance abuse problems. Drinking alcohol 3-4 40's every day and still doing opioids when he can get them. Admits to persistent depression and states he would like to deal with the depression this time around as thinks the depression keeps his alcohol and drug use going. States he has depression and he hears a voice inside his head, not his own  voice who tell him to hurt himself and hurt others. States that he has enough control where he is not going to hurt other people but does not teel the same way about hurting himself. States he would like to get to the bottom of it. states he does not remember things before 29 Y/O. Has been given Celexa what he does not take once he gets out of here  Dalton [name] is an 29 y.o. male who presented to [facility] with increasing suicidal ideations and multiple plans. He reported a 5 year history of opiate and alcohol abuse. He stated that he drinks 2 40 oz beers daily and takes 75. - 100 mg opiates (percocet or vicodin) when he has access to them. Patient attended in patient in [place] for substance abuse in November of last year and was in patient at [facility] in March of this year. He currently reports feelings of sadness, crying, anxiety, problems concentrating, difficulty with his long term memory, irritability, and only sleeps if he is using a substance or drinking alcohol. Patient stated that he frequently "smacks or punches" himself and believes that he has "messed up" his life. He reported that his life is "spiraling out of control" and he is feeling "hopeless"Patient does not have any past suicide attempts but stated that he thinks about suicide often. He reported that when he is driving he thinks about running his car off of the highway or into a tree. He reported that due to his drug and alcohol abuse he has lost his job and has come into financial difficulty. He was caught stealing at [place] and is now facing a Engineer, drilling.  Patient denied any homicidal ideations, past history of aggressive, hallucinations, or any outpatient treatment.   Associated Signs/Symptoms: Depression Symptoms:  depressed mood, anhedonia, hypersomnia, psychomotor retardation, fatigue, feelings of worthlessness/guilt, difficulty concentrating, suicidal thoughts with specific plan, loss of energy/fatigue, disturbed  sleep, weight loss, decreased appetite, (Hypo) Manic Symptoms:  Irritable Mood, Labiality of Mood, Anxiety Symptoms:  Excessive Worry, Psychotic Symptoms:  inside voice that tells him to do things last 6 months PTSD Symptoms: Negative Total Time spent with patient: 45 minutes  Past Psychiatric History:   Risk to Self: Is patient at risk for suicide?: Yes Risk to Others:   Prior Inpatient Therapy:  [facility] [place] Prior Outpatient Therapy:  Denies  Alcohol Screening: 1. How often do you have a drink containing alcohol?: 4 or more times a week 2. How many drinks containing alcohol do you have on a typical day when you are drinking?: 3 or 4 3. How often do you have six or more drinks on one occasion?: Daily or almost daily Preliminary Score: 5 4. How often during the last year have you found that you were not able to stop drinking once you had started?: Daily or almost daily 5. How often during the last year have you failed to do what was normally expected from you becasue of drinking?: Weekly 6. How often during the last year have you needed a first drink in the morning to get yourself going after a heavy drinking session?: Weekly 7. How often during the last year have you had a feeling of guilt of remorse after drinking?: Daily or almost daily 8. How often during the last year have you been unable to remember what happened the night before because you had been drinking?: Daily or almost daily 9. Have you or someone else been injured as a result of your drinking?: Yes, but not in the last year 10. Has a relative or friend or a doctor or another health worker been concerned about your drinking or suggested you cut down?: Yes, but not in the last year Alcohol Use Disorder Identification Test Final Score (AUDIT): 31 Brief Intervention: Patient declined brief intervention Substance Abuse History in the last 12 months:  Yes.   Consequences of Substance Abuse: Legal  Consequences:  DWI Withdrawal Symptoms:   restlessness Previous Psychotropic Medications: Yes Celexa  Psychological Evaluations: No  Past Medical History:  Past Medical History  Diagnosis Date  . Allergy   . Asthma   . Pulmonary tuberculosis     Past Surgical History  Procedure Laterality Date  . Tonsillectomy     Family History: History reviewed. No pertinent family history. Family Psychiatric  History: grandmother's mother bipolar, cousin Bipolar some substance abuse problems Social History:  History  Alcohol Use No     History  Drug Use No    Comment: former substance abuse    Social History   Social History  . Marital Status: Single    Spouse Name: N/A  . Number of Children: N/A  . Years of Education: N/A   Social History Main Topics  . Smoking status: Current Every Day Smoker -- 0.50 packs/day    Types: Cigarettes  . Smokeless tobacco: Never Used     Comment: 4 ciggs qd  . Alcohol Use: No  . Drug Use: No     Comment: former substance abuse  . Sexual Activity: Not Asked   Other Topics Concern  . None   Social History Narrative  Lives with GF. Almost got an associate degree in counseling. Will go back and finish it. Not working as he lost his job.  Additional Social History:                         Allergies:   Allergies  Allergen Reactions  . Cephalexin     Pt states he is not allergic to med   Lab Results:  Results for orders placed or performed during the hospital encounter of 09/03/15 (from the past 48 hour(s))  CBC with Differential     Status: Abnormal   Collection Time: 09/03/15 12:53 AM  Result Value Ref Range   WBC 13.5 (H) 4.0 - 10.5 K/uL   RBC 5.71 4.22 - 5.81 MIL/uL   Hemoglobin 16.3 13.0 - 17.0 g/dL   HCT 40.9 81.1 - 91.4 %   MCV 78.3 78.0 - 100.0 fL   MCH 28.5 26.0 - 34.0 pg   MCHC 36.5 (H) 30.0 - 36.0 g/dL   RDW 78.2 95.6 - 21.3 %   Platelets 276 150 - 400 K/uL   Neutrophils Relative % 40 %   Neutro Abs 5.4 1.7 - 7.7  K/uL   Lymphocytes Relative 44 %   Lymphs Abs 5.9 (H) 0.7 - 4.0 K/uL   Monocytes Relative 7 %   Monocytes Absolute 1.0 0.1 - 1.0 K/uL   Eosinophils Relative 8 %   Eosinophils Absolute 1.1 (H) 0.0 - 0.7 K/uL   Basophils Relative 1 %   Basophils Absolute 0.1 0.0 - 0.1 K/uL  I-stat chem 8, ed     Status: Abnormal   Collection Time: 09/03/15 12:53 AM  Result Value Ref Range   Sodium 143 135 - 145 mmol/L   Potassium 3.8 3.5 - 5.1 mmol/L   Chloride 101 101 - 111 mmol/L   BUN 7 6 - 20 mg/dL   Creatinine, Ser 0.86 (H) 0.61 - 1.24 mg/dL   Glucose, Bld 68 65 - 99 mg/dL   Calcium, Ion 5.78 1.12 - 1.23 mmol/L   TCO2 27 0 - 100 mmol/L   Hemoglobin 17.7 (H) 13.0 - 17.0 g/dL   HCT 46.9 62.9 - 52.8 %  Ethanol     Status: Abnormal   Collection Time: 09/03/15 12:53 AM  Result Value Ref Range   Alcohol, Ethyl (B) 259 (H) <5 mg/dL    Comment:        LOWEST DETECTABLE LIMIT FOR SERUM ALCOHOL IS 5 mg/dL FOR MEDICAL PURPOSES ONLY   Salicylate level     Status: None   Collection Time: 09/03/15 12:53 AM  Result Value Ref Range   Salicylate Lvl <4.0 2.8 - 30.0 mg/dL  Acetaminophen level     Status: Abnormal   Collection Time: 09/03/15 12:53 AM  Result Value Ref Range   Acetaminophen (Tylenol), Serum <10 (L) 10 - 30 ug/mL    Comment:        THERAPEUTIC CONCENTRATIONS VARY SIGNIFICANTLY. A RANGE OF 10-30 ug/mL MAY BE AN EFFECTIVE CONCENTRATION FOR MANY PATIENTS. HOWEVER, SOME ARE BEST TREATED AT CONCENTRATIONS OUTSIDE THIS RANGE. ACETAMINOPHEN CONCENTRATIONS >150 ug/mL AT 4 HOURS AFTER INGESTION AND >50 ug/mL AT 12 HOURS AFTER INGESTION ARE OFTEN ASSOCIATED WITH TOXIC REACTIONS.   Urine rapid drug screen (hosp performed)     Status: None   Collection Time: 09/03/15 12:59 AM  Result Value Ref Range   Opiates NONE DETECTED NONE DETECTED   Cocaine NONE DETECTED NONE DETECTED   Benzodiazepines NONE DETECTED NONE DETECTED   Amphetamines NONE DETECTED NONE DETECTED   Tetrahydrocannabinol  NONE DETECTED NONE DETECTED   Barbiturates NONE DETECTED NONE DETECTED  Comment:        DRUG SCREEN FOR MEDICAL PURPOSES ONLY.  IF CONFIRMATION IS NEEDED FOR ANY PURPOSE, NOTIFY LAB WITHIN 5 DAYS.        LOWEST DETECTABLE LIMITS FOR URINE DRUG SCREEN Drug Class       Cutoff (ng/mL) Amphetamine      1000 Barbiturate      200 Benzodiazepine   200 Tricyclics       300 Opiates          300 Cocaine          300 THC              50     Metabolic Disorder Labs:  Lab Results  Component Value Date   HGBA1C 5.4 06/08/2011   No results found for: PROLACTIN Lab Results  Component Value Date   CHOL 131 11/05/2010   TRIG 37.0 11/05/2010   HDL 37.60* 11/05/2010   CHOLHDL 3 11/05/2010   VLDL 7.4 11/05/2010   LDLCALC 86 11/05/2010    Current Medications: Current Facility-Administered Medications  Medication Dose Route Frequency Provider Last Rate Last Dose  . acetaminophen (TYLENOL) tablet 650 mg  650 mg Oral Q6H PRN Earney Navy, NP      . albuterol (PROVENTIL HFA;VENTOLIN HFA) 108 (90 BASE) MCG/ACT inhaler 2 puff  2 puff Inhalation Q4H PRN Earney Navy, NP      . alum & mag hydroxide-simeth (MAALOX/MYLANTA) 200-200-20 MG/5ML suspension 30 mL  30 mL Oral Q4H PRN Earney Navy, NP      . citalopram (CELEXA) tablet 20 mg  20 mg Oral Daily Earney Navy, NP   20 mg at 09/04/15 0811  . ibuprofen (ADVIL,MOTRIN) tablet 600 mg  600 mg Oral Q8H PRN Earney Navy, NP      . Melene Muller ON 09/05/2015] Influenza vac split quadrivalent PF (FLUARIX) injection 0.5 mL  0.5 mL Intramuscular Tomorrow-1000 Rachael Fee, MD      . LORazepam (ATIVAN) tablet 2 mg  2 mg Oral Q6H PRN Earney Navy, NP   2 mg at 09/03/15 2119  . magnesium hydroxide (MILK OF MAGNESIA) suspension 30 mL  30 mL Oral Daily PRN Earney Navy, NP      . ondansetron (ZOFRAN) tablet 4 mg  4 mg Oral Q8H PRN Earney Navy, NP      . [START ON 09/05/2015] pneumococcal 23 valent vaccine (PNU-IMMUNE)  injection 0.5 mL  0.5 mL Intramuscular Tomorrow-1000 Rachael Fee, MD      . traZODone (DESYREL) tablet 50 mg  50 mg Oral QHS,MR X 1 Earney Navy, NP   50 mg at 09/03/15 2200   PTA Medications: No prescriptions prior to admission    Musculoskeletal: Strength & Muscle Tone: within normal limits Gait & Station: normal Patient leans: normal  Psychiatric Specialty Exam: Physical Exam  Review of Systems  Constitutional: Positive for weight loss and malaise/fatigue.  HENT: Negative.   Eyes: Negative.   Respiratory: Positive for shortness of breath.        Half a pack a day  Cardiovascular: Positive for palpitations.  Gastrointestinal: Negative.   Genitourinary: Negative.   Musculoskeletal: Positive for back pain.  Skin: Negative.   Neurological: Positive for weakness.  Endo/Heme/Allergies: Negative.   Psychiatric/Behavioral: Positive for depression, suicidal ideas and substance abuse. The patient is nervous/anxious and has insomnia.     Blood pressure 125/79, pulse 103, temperature 98.1 F (36.7 C), temperature source Oral, resp. rate  16, height  (1.803 m), weight 62.143 kg (137 lb), SpO2 100 %.Body mass index is 19.12 kg/(m^2).  General Appearance: Fairly Groomed  Patent attorney::  Fair  Speech:  Clear and Coherent  Volume:  Decreased  Mood:  Anxious and Depressed  Affect:  Restricted  Thought Process:  Coherent and Goal Directed  Orientation:  Full (Time, Place, and Person)  Thought Content:  symptoms events worries concerns  Suicidal Thoughts:  Yes.  without intent/plan  Homicidal Thoughts:  No  Memory:  Immediate;   Fair Recent;   Fair Remote;   Fair  Judgement:  Fair  Insight:  Present and Shallow  Psychomotor Activity:  Restlessness  Concentration:  Fair  Recall:  [employer] of Knowledge:Fair  Language: Fair  Akathisia:  No  Handed:  Right  AIMS (if indicated):     Assets:  Desire for Improvement  ADL's:  Intact  Cognition: WNL  Sleep:  Number of  Hours: 6.75     Treatment Plan Summary: Daily contact with patient to assess and evaluate symptoms and progress in treatment and Medication management Supportive approach/coping skills Alcohol dependence; Ativan detox protocol/work a relapse prevention plan Depression; will continue the Celexa 20 mg Psychotic features; will add an antipsychotic Will work with CBT/minfulness/further explore the presence of trauma Observation Level/Precautions:  15 minute checks  Laboratory:  As per the ED  Psychotherapy:  Individual/group  Medications:  Will pursue the Celexa and probably add an antipsychotic  Consultations:    Discharge Concerns:    Estimated LOS: 3-5 days  Other:     I certify that inpatient services furnished can reasonably be expected to improve the patient's condition.   LUGO,IRVING A 9/28/201611:36 AM

## 2015-09-04 NOTE — Progress Notes (Signed)
NUTRITION ASSESSMENT  Pt identified as at risk on the Malnutrition Screen Tool  INTERVENTION: 1. Educated patient on the importance of nutrition and encouraged intake of food and beverages. 2. Discussed weight goals. 3. Supplements: none  NUTRITION DIAGNOSIS: No nutrition dx at this time.   Goal: Pt to meet >/= 90% of their estimated nutrition needs.  Monitor:  PO intake  Assessment:  Pt seen for MST. Pt is unsure if he lost weight PTA. He was admitted for alcohol abuse and indicates drinking 3-4 40oz beers/day and using cocaine sometimes.  Chart review indicates 3 lb weight loss in the past 6 months which is not significant for time frame.  No supplements needed at this time.  29 y.o. male  Height: Ht Readings from Last 1 Encounters:  09/03/15  (1.803 m)    Weight: Wt Readings from Last 1 Encounters:  09/03/15 137 lb (62.143 kg)    Weight Hx: Wt Readings from Last 10 Encounters:  09/03/15 137 lb (62.143 kg)  02/12/15 140 lb (63.504 kg)  10/13/14 137 lb (62.143 kg)  10/13/14 140 lb (63.504 kg)  02/03/13 138 lb (62.596 kg)  01/31/13 135 lb (61.236 kg)  07/27/12 142 lb (64.411 kg)  03/08/12 140 lb (63.504 kg)  11/11/11 149 lb (67.586 kg)  06/08/11 147 lb (66.679 kg)    BMI:  Body mass index is 19.12 kg/(m^2). Pt meets criteria for normal weight based on current BMI.  Estimated Nutritional Needs: Kcal: 25-30 kcal/kg Protein: > 1 gram protein/kg Fluid: 1 ml/kcal  Diet Order: Diet regular Room service appropriate?: Yes; Fluid consistency:: Thin Pt is also offered choice of unit snacks mid-morning and mid-afternoon.  Pt is eating as desired.   Lab results and medications reviewed.      Trenton Gammon, RD, LDN Inpatient Clinical Dietitian Pager # [phone] After hours/weekend pager # [phone]

## 2015-09-04 NOTE — [facility] Group Notes (Signed)
[facility] LCSW Aftercare Discharge Planning Group Note   09/04/2015 10:31 AM  Participation Quality:  Minimal   Mood/Affect:  Depressed  Depression Rating:  10  Anxiety Rating:  5-6  Thoughts of Suicide:  No Will you contract for safety?   NA  Current AVH:  No  Plan for Discharge/Comments:  Pt reports that he has no current providers and was at [facility] in March 2016. Pt reports opiate/alcohol use but continues to minimize usage. Pt reports that "my depression is my real problem." Pt lives in [place] with his girlfriend. Pt reports feeling jittery this morning. He is interested in o/p mental health services.   Transportation Means: gf?   Supports: some family supports and girlfriend   Counselling psychologist, Oncologist

## 2015-09-04 NOTE — [facility] Suicide Risk Assessment (Signed)
[facility] Admission Suicide Risk Assessment   Nursing information obtained from:  Patient Demographic factors:  Male, Unemployed Current Mental Status:  NA Loss Factors:  Legal issues Historical Factors:  Family history of mental illness or substance abuse Risk Reduction Factors:  Positive social support Total Time spent with patient: 45 minutes Principal Problem: <principal problem not specified> Diagnosis:   Patient Active Problem List   Diagnosis Date Noted  . Major depressive disorder, recurrent, severe without psychotic features [F33.2]   . 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 Alcohol use disorder, severe, dependence [F10.20] 02/13/2015  . Cocaine use disorder, severe, dependence [F14.20] 02/13/2015  . Cannabis use disorder, severe, dependence [F12.20] 02/13/2015  . Alcohol withdrawal syndrome without complication [F10.230] 02/13/2015  . Opioid use disorder, severe, dependence [F11.20] 02/13/2015  . Opioid withdrawal [F11.23] 02/13/2015  . Depression [F32.9]   . Substance abuse [F19.10] 10/14/2014  . Opiate dependence, continuous [F11.20] 10/13/2014  . 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 Eyelid abnormality [H02.9] 03/12/2012  . DIABETES MELLITUS, TYPE II [E11.9] 11/10/2010  . ABSCESS, GLUTEAL [IMO0002] 10/20/2010  . CERVICAL LYMPHADENOPATHY [R59.1] 05/16/2010  . 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 VIRAL URI [J06.9] 10/02/2008  . PLEURAL EFFUSION, LEFT [J90] 03/22/2008  . DYSPNEA/SHORTNESS OF BREATH [R06.09, R09.89] 03/20/2008  . ALLERGIC RHINITIS [J30.9] 02/01/2008  . ASTHMA [J45.909] 02/01/2008  . FEVER, HX OF [Z91.89] 02/01/2008     Continued Clinical Symptoms:  Alcohol Use Disorder Identification Test Final Score (AUDIT): 31 The "Alcohol Use Disorders Identification Test", Guidelines for Use in Primary Care, Second Edition.  World Science writer [facility]). Score between 0-7:  no or low  risk or alcohol related problems. Score between 8-15:  moderate risk of alcohol related problems. Score between 16-19:  high risk of alcohol related problems. Score 20 or above:  warrants further diagnostic evaluation for alcohol dependence and treatment.   CLINICAL FACTORS:   Depression:   Comorbid alcohol abuse/dependence Impulsivity Alcohol/Substance Abuse/Dependencies   Psychiatric Specialty Exam: Physical Exam  ROS  Blood pressure 138/84, pulse 93, temperature 98.7 F (37.1 C), temperature source Oral, resp. rate 18, height  (1.803 m), weight 62.143 kg (137 lb), SpO2 100 %.Body mass index is 19.12 kg/(m^2).   COGNITIVE FEATURES THAT CONTRIBUTE TO RISK:  Closed-mindedness, Polarized thinking and Thought constriction (tunnel vision)    SUICIDE RISK:   Moderate:  Frequent suicidal ideation with limited intensity, and duration, some specificity in terms of plans, no associated intent, good self-control, limited dysphoria/symptomatology, some risk factors present, and identifiable protective factors, including available and accessible social support.  PLAN OF CARE: See Admission H and PE  Medical Decision Making:  Review of Psycho-Social Stressors (1), Review or order clinical lab tests (1), Review of Medication Regimen & Side Effects (2) and Review of New Medication or Change in Dosage (2)  I certify that inpatient services furnished can reasonably be expected to improve the patient's condition.   LUGO,IRVING A 09/04/2015, 6:14 PM

## 2015-09-05 MED ORDER — CITALOPRAM HYDROBROMIDE 10 MG PO TABS
30.0000 mg | ORAL_TABLET | Freq: Every day | ORAL | Status: DC
  Administered 2015-09-06 – 2015-09-09 (×4): 30 mg via ORAL
  Filled 2015-09-05 (×2): qty 3
  Filled 2015-09-05: qty 21
  Filled 2015-09-05 (×3): qty 3

## 2015-09-05 MED ORDER — ARIPIPRAZOLE 2 MG PO TABS: 2.0000 mg | ORAL_TABLET | Freq: Every day | ORAL | Status: DC

## 2015-09-05 MED ORDER — ARIPIPRAZOLE 2 MG PO TABS
2.0000 mg | ORAL_TABLET | Freq: Every day | ORAL | Status: DC
  Administered 2015-09-05 – 2015-09-06 (×2): 2 mg via ORAL
  Filled 2015-09-05 (×4): qty 1

## 2015-09-05 MED ORDER — QUETIAPINE FUMARATE ER 50 MG PO TB24: 100.0000 mg | ORAL_TABLET | Freq: Every day | ORAL | Status: DC

## 2015-09-05 NOTE — [facility] Group Notes (Signed)
[facility] LCSW Group Therapy  09/05/2015 12:07 PM  Type of Therapy:  Group Therapy  Participation Level:  Did Not Attend-pt chose to remain in bed.   Modes of Intervention:  Discussion, Education, Exploration, Problem-solving, Rapport Building, Socialization and Support  Summary of Progress/Problems:  Finding Balance in Life. Today's group focused on defining balance in one's own words, identifying things that can knock one off balance, and exploring healthy ways to maintain balance in life. Group members were asked to provide an example of a time when they felt off balance, describe how they handled that situation,and process healthier ways to regain balance in the future. Group members were asked to share the most important tool for maintaining balance that they learned while at [facility] and how they plan to apply this method after discharge.   Smart, Heather LCSWA  09/05/2015, 12:07 PM

## 2015-09-05 NOTE — Progress Notes (Signed)
   D: Writer observed the pt in the dayroom, however he wasn't interacting with his peers. Pt was visibly anxious aeb shaking his legs, as well as facial expression. Pt informed the writer that he "hearing voices telling him to be destructive." Pt asked the writer for "some medicine to help with the voices".  Pt voiced no questions or concerns.  A: Writer contacted extender and received an order for seroquel. Adm med to the pt. Support and encouragement was offered. 15 min checks continued for safety.  R: Pt remains safe.

## 2015-09-05 NOTE — Progress Notes (Signed)
[facility] MD Progress Note  09/05/2015 5:08 PM Dalton [name]  MRN:  [id number] Subjective:  Dalton [name] continues to endorse depression. He also endorses that he has  the voices inside his head that tell him to kill himself. He admits to the on going use of alcohol. The voices are not related to his drinking. Has used alcohol to take care of the way he feels.  Principal Problem: Severe major depression with psychotic features Diagnosis:   Patient Active Problem List   Diagnosis Date Noted  . Severe major depression with psychotic features [F32.3] 09/04/2015  . 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 Alcohol use disorder, severe, dependence [F10.20] 02/13/2015  . Alcohol withdrawal syndrome without complication [F10.230] 02/13/2015  . Opioid use disorder, severe, dependence [F11.20] 02/13/2015  . Opioid withdrawal [F11.23] 02/13/2015  . Opiate dependence, continuous [F11.20] 10/13/2014  . 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 Eyelid abnormality [H02.9] 03/12/2012  . DIABETES MELLITUS, TYPE II [E11.9] 11/10/2010  . ABSCESS, GLUTEAL [IMO0002] 10/20/2010  . CERVICAL LYMPHADENOPATHY [R59.1] 05/16/2010  . 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 VIRAL URI [J06.9] 10/02/2008  . PLEURAL EFFUSION, LEFT [J90] 03/22/2008  . ALLERGIC RHINITIS [J30.9] 02/01/2008  . ASTHMA [J45.909] 02/01/2008   Total Time spent with patient: 30 minutes  Past Psychiatric History: See Admission H and P  Past Medical History:  Past Medical History  Diagnosis Date  . Allergy   . Asthma   . Pulmonary tuberculosis     Past Surgical History  Procedure Laterality Date  . Tonsillectomy     Family History: History reviewed. No pertinent family history. Family Psychiatric  History: see Admission H and P Social History:  History  Alcohol Use No     History  Drug Use No    Comment: former substance abuse    Social History   Social History   . Marital Status: Single    Spouse Name: N/A  . Number of Children: N/A  . Years of Education: N/A   Social History Main Topics  . Smoking status: Current Every Day Smoker -- 0.50 packs/day    Types: Cigarettes  . Smokeless tobacco: Never Used     Comment: 4 ciggs qd  . Alcohol Use: No  . Drug Use: No     Comment: former substance abuse  . Sexual Activity: Not Asked   Other Topics Concern  . None   Social History Narrative   Additional Social History:                         Sleep: Fair  Appetite:  Fair  Current Medications: Current Facility-Administered Medications  Medication Dose Route Frequency Provider Last Rate Last Dose  . acetaminophen (TYLENOL) tablet 650 mg  650 mg Oral Q6H PRN Earney Navy, NP      . albuterol (PROVENTIL HFA;VENTOLIN HFA) 108 (90 BASE) MCG/ACT inhaler 2 puff  2 puff Inhalation Q4H PRN Earney Navy, NP      . alum & mag hydroxide-simeth (MAALOX/MYLANTA) 200-200-20 MG/5ML suspension 30 mL  30 mL Oral Q4H PRN Earney Navy, NP      . ARIPiprazole (ABILIFY) tablet 2 mg  2 mg Oral Daily Rachael Fee, MD   2 mg at 09/05/15 1144  . [START ON 09/06/2015] citalopram (CELEXA) tablet 30 mg  30 mg Oral Daily Rachael Fee, MD      . hydrOXYzine (ATARAX/VISTARIL) tablet 25  mg  25 mg Oral Q6H PRN Rachael Fee, MD      . ibuprofen (ADVIL,MOTRIN) tablet 600 mg  600 mg Oral Q8H PRN Earney Navy, NP      . loperamide (IMODIUM) capsule 2-4 mg  2-4 mg Oral PRN Rachael Fee, MD      . LORazepam (ATIVAN) tablet 1 mg  1 mg Oral Q6H PRN Rachael Fee, MD      . LORazepam (ATIVAN) tablet 1 mg  1 mg Oral TID Rachael Fee, MD   1 mg at 09/05/15 1156   Followed by  . [START ON 09/06/2015] LORazepam (ATIVAN) tablet 1 mg  1 mg Oral BID Rachael Fee, MD       Followed by  . [START ON 09/07/2015] LORazepam (ATIVAN) tablet 1 mg  1 mg Oral Daily Rachael Fee, MD      . magnesium hydroxide (MILK OF MAGNESIA) suspension 30 mL  30 mL Oral Daily  PRN Earney Navy, NP      . ondansetron (ZOFRAN) tablet 4 mg  4 mg Oral Q8H PRN Earney Navy, NP      . traZODone (DESYREL) tablet 50 mg  50 mg Oral QHS,MR X 1 Earney Navy, NP   50 mg at 09/04/15 2112    Lab Results: No results found for this or any previous visit (from the past 48 hour(s)).  Physical Findings: AIMS: Facial and Oral Movements Muscles of Facial Expression: None, normal Lips and Perioral Area: None, normal Jaw: None, normal Tongue: None, normal,Extremity Movements Upper (arms, wrists, hands, fingers): None, normal Lower (legs, knees, ankles, toes): None, normal, Trunk Movements Neck, shoulders, hips: None, normal, Overall Severity Severity of abnormal movements (highest score from questions above): None, normal Incapacitation due to abnormal movements: None, normal Patient's awareness of abnormal movements (rate only patient's report): No Awareness, Dental Status Current problems with teeth and/or dentures?: No Does patient usually wear dentures?: No  CIWA:  CIWA-Ar Total: 2 COWS:     Musculoskeletal: Strength & Muscle Tone: within normal limits Gait & Station: normal Patient leans: normal  Psychiatric Specialty Exam: Review of Systems  Constitutional: Positive for malaise/fatigue.  HENT: Negative.   Eyes: Negative.   Respiratory: Negative.   Cardiovascular: Negative.   Gastrointestinal: Negative.   Genitourinary: Negative.   Musculoskeletal: Negative.   Skin: Negative.   Neurological: Positive for weakness.  Endo/Heme/Allergies: Negative.   Psychiatric/Behavioral: Positive for depression, hallucinations and substance abuse.    Blood pressure 118/73, pulse 67, temperature 98.2 F (36.8 C), temperature source Oral, resp. rate 16, height  (1.803 m), weight 62.143 kg (137 lb), SpO2 100 %.Body mass index is 19.12 kg/(m^2).  General Appearance: Fairly Groomed  Patent attorney::  Fair  Speech:  Clear and Coherent  Volume:  Normal  Mood:   Anxious and Depressed  Affect:  Depressed and Restricted  Thought Process:  Coherent and Goal Directed  Orientation:  Full (Time, Place, and Person)  Thought Content:  symptoms events worries concerns  Suicidal Thoughts:  No  Homicidal Thoughts:  No  Memory:  Immediate;   Fair Recent;   Fair Remote;   Fair  Judgement:  Fair  Insight:  Present  Psychomotor Activity:  Decreased  Concentration:  Fair  Recall:  [employer] of Knowledge:Fair  Language: Fair  Akathisia:  No  Handed:  Right  AIMS (if indicated):     Assets:  Desire for Improvement  ADL's:  Intact  Cognition:  WNL  Sleep:  Number of Hours: 6.5   Treatment Plan Summary: Daily contact with patient to assess and evaluate symptoms and progress in treatment and Medication management Supportive approach/coping skills Alcohol dependence; continue detox/work a relapse prevention plan Depression; will increase the Celexa to 30 mg and augment with Abilify 2 mg Hallucinations; start Abilify 2 mg and optimize response Use CBT/mindfulness LUGO,IRVING A 09/05/2015, 5:08 PM

## 2015-09-05 NOTE — Progress Notes (Signed)
Patient ID: Dalton [name], male   DOB: Jun 29, 1986, 29 y.o.   MRN: [id number] D: Client in room most of the shift, but was seen on the phone. Client reports depression "10" of 10. "trying to find a job, go to work" "been through this three times" Client report AH, but does not elaborate. A: Writer review medications, administer as ordered. Encouraged client to consider school or a trade to find a job, as this seemed to be his main focus. Staff will monitor q45min for safety. R: client is safe on the unit, did not attend karaoke.

## 2015-09-05 NOTE — Progress Notes (Signed)
Pt did not attend karaoke group this evening.  

## 2015-09-05 NOTE — [facility] Group Notes (Signed)
[facility] Group Notes:  (Nursing/MHT/Case Management/Adjunct)  Date:  09/05/2015  Time:  4:37 PM  Type of Therapy:  Psychoeducational Skills  Participation Level:  Did Not Attend  Participation Quality:  DID NOT ATTEND  Affect:  DID NOT ATTEND  Cognitive:  DID NOT ATTEND  Insight:  None  Engagement in Group:  DID NOT ATTEND  Modes of Intervention:  DID NOT ATTEND  Summary of Progress/Problems: Pt did not attend patient self inventory group.    Bethann Punches 09/05/2015, 4:37 PM

## 2015-09-05 NOTE — Progress Notes (Signed)
D: Pt continues to be very flat and depressed on the unit today. Pt also continues to be very isolative and has been in the bed most of the shift. It took a lot of prompt for the pt to come to the medication window to take his meds . No complains reported.Pt reported that his depression was a 0, his hopelessness was a 0, and that his anxiety was a 0. Pt reported being negative SI/HI, no AH/VH noted. A: 15 min checks continued for patient safety. R: Pts safety maintained.

## 2015-09-06 MED ORDER — LAMOTRIGINE 25 MG PO TABS
25.0000 mg | ORAL_TABLET | Freq: Every day | ORAL | Status: DC
  Administered 2015-09-06 – 2015-09-09 (×4): 25 mg via ORAL
  Filled 2015-09-06: qty 1
  Filled 2015-09-06: qty 7
  Filled 2015-09-06 (×5): qty 1

## 2015-09-06 MED ORDER — ARIPIPRAZOLE 5 MG PO TABS
5.0000 mg | ORAL_TABLET | Freq: Every day | ORAL | Status: DC
  Administered 2015-09-07 – 2015-09-09 (×3): 5 mg via ORAL
  Filled 2015-09-06 (×4): qty 1
  Filled 2015-09-06: qty 7
  Filled 2015-09-06: qty 1

## 2015-09-06 NOTE — [facility] Group Notes (Signed)
[facility] LCSW Group Therapy  09/06/2015 12:37 PM  Type of Therapy:  Group Therapy  Participation Level:  Did Not Attend-pt invited. Chose to remain in bed.   Modes of Intervention:  Confrontation, Discussion, Education, Exploration, Problem-solving, Rapport Building, Socialization and Support  Summary of Progress/Problems: Feelings around Relapse. Group members discussed the meaning of relapse and shared personal stories of relapse, how it affected them and others, and how they perceived themselves during this time. Group members were encouraged to identify triggers, warning signs and coping skills used when facing the possibility of relapse. Social supports were discussed and explored in detail. Post Acute Withdrawal Syndrome (handout provided) was introduced and examined. Pt's were encouraged to ask questions, talk about key points associated with PAWS, and process this information in terms of relapse prevention.   Smart, Heather  LCSWA  09/06/2015, 12:37 PM

## 2015-09-06 NOTE — Progress Notes (Signed)
Patient attended AA group and participated. 

## 2015-09-06 NOTE — Progress Notes (Signed)
[facility] MD Progress Note  09/06/2015 4:35 PM Dalton [name]  MRN:  [id number] Subjective:  Dalton [name] states he is experiencing depression as well as the voices. He also describes mood swings, mostly continuing to fall into the depression over and over again. He admits he really needs to come off the alcohol. The depression, the voices, the mood instability as he sees it are not associated to the alcohol. He feels that the alcohol could have triggered in the beginning but now it is not related Principal Problem: Severe major depression with psychotic features Diagnosis:   Patient Active Problem List   Diagnosis Date Noted  . Severe major depression with psychotic features [F32.3] 09/04/2015  . 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 Alcohol use disorder, severe, dependence [F10.20] 02/13/2015  . Alcohol withdrawal syndrome without complication [F10.230] 02/13/2015  . Opioid use disorder, severe, dependence [F11.20] 02/13/2015  . Opioid withdrawal [F11.23] 02/13/2015  . Opiate dependence, continuous [F11.20] 10/13/2014  . 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 Eyelid abnormality [H02.9] 03/12/2012  . DIABETES MELLITUS, TYPE II [E11.9] 11/10/2010  . ABSCESS, GLUTEAL [IMO0002] 10/20/2010  . CERVICAL LYMPHADENOPATHY [R59.1] 05/16/2010  . 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 VIRAL URI [J06.9] 10/02/2008  . PLEURAL EFFUSION, LEFT [J90] 03/22/2008  . ALLERGIC RHINITIS [J30.9] 02/01/2008  . ASTHMA [J45.909] 02/01/2008   Total Time spent with patient: 30 minutes  Past Psychiatric History: see Admission H and PE  Past Medical History:  Past Medical History  Diagnosis Date  . Allergy   . Asthma   . Pulmonary tuberculosis     Past Surgical History  Procedure Laterality Date  . Tonsillectomy     Family History: History reviewed. No pertinent family history. Family Psychiatric  History: see Admission H and PE Social  History:  History  Alcohol Use No     History  Drug Use No    Comment: former substance abuse    Social History   Social History  . Marital Status: Single    Spouse Name: N/A  . Number of Children: N/A  . Years of Education: N/A   Social History Main Topics  . Smoking status: Current Every Day Smoker -- 0.50 packs/day    Types: Cigarettes  . Smokeless tobacco: Never Used     Comment: 4 ciggs qd  . Alcohol Use: No  . Drug Use: No     Comment: former substance abuse  . Sexual Activity: Not Asked   Other Topics Concern  . None   Social History Narrative   Additional Social History:                         Sleep: Fair  Appetite:  Fair  Current Medications: Current Facility-Administered Medications  Medication Dose Route Frequency Provider Last Rate Last Dose  . acetaminophen (TYLENOL) tablet 650 mg  650 mg Oral Q6H PRN Earney Navy, NP      . albuterol (PROVENTIL HFA;VENTOLIN HFA) 108 (90 BASE) MCG/ACT inhaler 2 puff  2 puff Inhalation Q4H PRN Earney Navy, NP      . alum & mag hydroxide-simeth (MAALOX/MYLANTA) 200-200-20 MG/5ML suspension 30 mL  30 mL Oral Q4H PRN Earney Navy, NP      . ARIPiprazole (ABILIFY) tablet 2 mg  2 mg Oral Daily Rachael Fee, MD   2 mg at 09/06/15 0858  . citalopram (CELEXA) tablet 30 mg  30 mg  Oral Daily Rachael Fee, MD   30 mg at 09/06/15 [phone]  . hydrOXYzine (ATARAX/VISTARIL) tablet 25 mg  25 mg Oral Q6H PRN Rachael Fee, MD      . ibuprofen (ADVIL,MOTRIN) tablet 600 mg  600 mg Oral Q8H PRN Earney Navy, NP      . loperamide (IMODIUM) capsule 2-4 mg  2-4 mg Oral PRN Rachael Fee, MD      . LORazepam (ATIVAN) tablet 1 mg  1 mg Oral Q6H PRN Rachael Fee, MD      . LORazepam (ATIVAN) tablet 1 mg  1 mg Oral BID Rachael Fee, MD   1 mg at 09/06/15 0858   Followed by  . [START ON 09/07/2015] LORazepam (ATIVAN) tablet 1 mg  1 mg Oral Daily Rachael Fee, MD      . magnesium hydroxide (MILK OF MAGNESIA)  suspension 30 mL  30 mL Oral Daily PRN Earney Navy, NP      . ondansetron (ZOFRAN) tablet 4 mg  4 mg Oral Q8H PRN Earney Navy, NP      . traZODone (DESYREL) tablet 50 mg  50 mg Oral QHS,MR X 1 Earney Navy, NP   50 mg at 09/05/15 2233    Lab Results: No results found for this or any previous visit (from the past 48 hour(s)).  Physical Findings: AIMS: Facial and Oral Movements Muscles of Facial Expression: None, normal Lips and Perioral Area: None, normal Jaw: None, normal Tongue: None, normal,Extremity Movements Upper (arms, wrists, hands, fingers): None, normal Lower (legs, knees, ankles, toes): None, normal, Trunk Movements Neck, shoulders, hips: None, normal, Overall Severity Severity of abnormal movements (highest score from questions above): None, normal Incapacitation due to abnormal movements: None, normal Patient's awareness of abnormal movements (rate only patient's report): No Awareness, Dental Status Current problems with teeth and/or dentures?: No Does patient usually wear dentures?: No  CIWA:  CIWA-Ar Total: 0 COWS:     Musculoskeletal: Strength & Muscle Tone: within normal limits Gait & Station: normal Patient leans: normal  Psychiatric Specialty Exam: Review of Systems  Constitutional: Negative.   HENT: Negative.   Eyes: Negative.   Respiratory: Negative.   Cardiovascular: Negative.   Gastrointestinal: Negative.   Genitourinary: Negative.   Musculoskeletal: Negative.   Skin: Negative.   Neurological: Negative.   Endo/Heme/Allergies: Negative.   Psychiatric/Behavioral: Positive for depression, hallucinations and substance abuse. The patient is nervous/anxious.     Blood pressure 133/86, pulse 73, temperature 98.8 F (37.1 C), temperature source Oral, resp. rate 16, height  (1.803 m), weight 62.143 kg (137 lb), SpO2 98 %.Body mass index is 19.12 kg/(m^2).  General Appearance: Fairly Groomed  Patent attorney::  Fair  Speech:  Clear and  Coherent  Volume:  Decreased  Mood:  Anxious and Depressed  Affect:  Restricted  Thought Process:  Coherent and Goal Directed  Orientation:  Full (Time, Place, and Person)  Thought Content:  symptoms events worries concerns  Suicidal Thoughts:  No  Homicidal Thoughts:  No  Memory:  Immediate;   Fair Recent;   Fair Remote;   Fair  Judgement:  Fair  Insight:  Present and Shallow  Psychomotor Activity:  Restlessness  Concentration:  Fair  Recall:  [employer] of Knowledge:Fair  Language: Fair  Akathisia:  No  Handed:  Right  AIMS (if indicated):     Assets:  Desire for Improvement Housing Social Support  ADL's:  Intact  Cognition: WNL  Sleep:  Number of Hours: 6.5   Treatment Plan Summary: Daily contact with patient to assess and evaluate symptoms and progress in treatment and Medication management Supportive approach/coping skills Alcohol dependence; continue Ativan detox protocol/work a relapse prevention plan Depression; continue the Celexa at 30 mg Hallucinations: increase the Abilify to 5 mg daily Mood instability; start a trial with Lamictal 25 mg Work with CBT/mindfulness:  Yarelli Decelles A 09/06/2015, 4:35 PM

## 2015-09-06 NOTE — Plan of Care (Signed)
Problem: Alteration in mood Goal: LTG-Patient reports reduction in suicidal thoughts (Patient reports reduction in suicidal thoughts and is able to verbalize a safety plan for whenever patient is feeling suicidal)  Outcome: Progressing Client is safe on the unit AEB q70min safety check, contracts for safety.

## 2015-09-06 NOTE — [facility] Group Notes (Signed)
[facility] LCSW Aftercare Discharge Planning Group Note   09/06/2015 10:42 AM  Participation Quality:  Invited-DID NOT ATTEND. Pt chose to remain in bed.   Smart, [employer]

## 2015-09-06 NOTE — Progress Notes (Signed)
Nutrition Education Note  Pt attended group focusing on general, healthful nutrition education.  RD emphasized the importance of eating regular meals and snacks throughout the day. Consuming sugar-free beverages and incorporating fruits and vegetables into diet when possible. Provided examples of healthy snacks. Patient encouraged to leave group with a goal to improve nutrition/healthy eating.   Diet Order: Diet regular Room service appropriate?: Yes; Fluid consistency:: Thin Pt is also offered choice of unit snacks mid-morning and mid-afternoon.  Pt is eating as desired.   If additional nutrition issues arise, please consult RD.     Jessica Ostheim, RD, LDN Inpatient Clinical Dietitian Pager # [phone] After hours/weekend pager # [phone]     

## 2015-09-06 NOTE — Progress Notes (Signed)
D: Patient continues to isolate to his room.  He came up for morning medications and refused to go to group.  He presents with flat affect.  His mood is sad and depressed.  Patient reports unspecific AH; denies SI/HI.  Patient was stated on abilify.  He denies any physical symptoms. A: Continue to monitor medication management and MD orders.  Safety checks completed every 15 minutes per protocol.  Offer support and encouragement as needed. R: Patient does not appear receptive to treatment.

## 2015-09-07 DIAGNOSIS — F333 Major depressive disorder, recurrent, severe with psychotic symptoms: Secondary | ICD-10-CM

## 2015-09-07 MED ORDER — NICOTINE 21 MG/24HR TD PT24
21.0000 mg | MEDICATED_PATCH | Freq: Every day | TRANSDERMAL | Status: DC
  Administered 2015-09-07: 21 mg via TRANSDERMAL
  Filled 2015-09-07 (×5): qty 1

## 2015-09-07 MED ORDER — HYDROXYZINE HCL 50 MG PO TABS
50.0000 mg | ORAL_TABLET | Freq: Four times a day (QID) | ORAL | Status: DC | PRN
  Administered 2015-09-07 – 2015-09-08 (×4): 50 mg via ORAL
  Filled 2015-09-07 (×4): qty 1
  Filled 2015-09-07: qty 10

## 2015-09-07 NOTE — Progress Notes (Signed)
D: Patient attended group tonight. When I inquired as to his thoughts about groups, he states "they're all the same anyway". His affect is flat and he is minimally interactive and very isolative this evening. He rates his depression 10/10 and anxiety 10/10. He states his goal upon discharge is to attend intensive OP therapy as he states he will likely not be able to cope on his own. Denies SI/HI/AVH.  A: Encouraged Dalton [name] to continue to attend groups and try to identify coping skills that he may be able to use as OP.  R: Continue to monitor patient for safety and medication effectiveness.

## 2015-09-07 NOTE — Progress Notes (Signed)
[name] attended group. He continues to be minimally interactive. He denies SI/HI/AVH.  Dalton [name]'s mom had a few questions this evening during her visit. His father and brother were present as well. She wanted to know what medications he was taking and if there was anything the family could do to prevent his relapse. Dalton [name] was present during her questions and was agreeable to me having a conversation with them.  A: Encouraged Dalton [name]'s mom to continue to be supportive. However, discussed with her that Dalton [name]'s relapse prevention is determined solely on his decision to not relapse along with compliance with continued OP therapy.  R: Medications administered as ordered with verbal education. Q 15 minutes checks remains effective for safety. Support and availability provided to pt during this shift.

## 2015-09-07 NOTE — Progress Notes (Signed)
Patient attended AA group and participated. 

## 2015-09-07 NOTE — Plan of Care (Signed)
Problem: Alteration in mood Goal: STG-Patient reports thoughts of self-harm to staff Outcome: Progressing Pt denies SI at this time. Pt is guarded, minimal interactions observed with others. No gestures or event of self injurious behavior noted or reported thus far this shift. Q 15 minutes checks maintained for safety.

## 2015-09-07 NOTE — Progress Notes (Signed)
[facility] MD Progress Note  09/07/2015 2:20 PM Dalton [name]  MRN:  [id number] Subjective:  Pt states: "I'm not doing so well. I'm having thoughts about dying but I wouldn't do it. I just feel very depressed."   Objective: Pt seen and chart reviewed. Pt continues to report suicidal ideation, generalized, without plan. He can contract for safety on the unit. He presents as depressed today, rates his anxiety and depression both at 7/10, and reports that he feels his medications are not working well. Pt cites good sleep and appetite and denies any current side effects. He does deny psychosis today and does not appear to be responding to internal stimuli.   Principal Problem: Severe recurrent major depressive disorder with psychotic features (HCC) Diagnosis:   Patient Active Problem List   Diagnosis Date Noted  . Severe recurrent major depressive disorder with psychotic features [F33.3]     Priority: High  . Alcohol use disorder, severe, dependence [F10.20] 02/13/2015    Priority: High  . 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 Alcohol withdrawal syndrome without complication [F10.230] 02/13/2015  . Opioid use disorder, severe, dependence [F11.20] 02/13/2015  . Opioid withdrawal [F11.23] 02/13/2015  . Opiate dependence, continuous [F11.20] 10/13/2014  . 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 Eyelid abnormality [H02.9] 03/12/2012  . DIABETES MELLITUS, TYPE II [E11.9] 11/10/2010  . ABSCESS, GLUTEAL [IMO0002] 10/20/2010  . CERVICAL LYMPHADENOPATHY [R59.9] 05/16/2010  . 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 VIRAL URI [J06.9] 10/02/2008  . PLEURAL EFFUSION, LEFT [J90] 03/22/2008  . ALLERGIC RHINITIS [J30.9] 02/01/2008  . ASTHMA [J45.909] 02/01/2008   Total Time spent with patient: 15 minutes  Past Psychiatric History: see Admission H&P  Past Medical History:  Past Medical History  Diagnosis Date  . Allergy   .  Asthma   . Pulmonary tuberculosis     Past Surgical History  Procedure Laterality Date  . Tonsillectomy     Family History: History reviewed. No pertinent family history. Family Psychiatric  History: see Admission H and PE Social History:  History  Alcohol Use No     History  Drug Use No    Comment: former substance abuse    Social History   Social History  . Marital Status: Single    Spouse Name: N/A  . Number of Children: N/A  . Years of Education: N/A   Social History Main Topics  . Smoking status: Current Every Day Smoker -- 0.50 packs/day    Types: Cigarettes  . Smokeless tobacco: Never Used     Comment: 4 ciggs qd  . Alcohol Use: No  . Drug Use: No     Comment: former substance abuse  . Sexual Activity: Not Asked   Other Topics Concern  . None   Social History Narrative   Additional Social History:                         Sleep: Fair  Appetite:  Fair  Current Medications: Current Facility-Administered Medications  Medication Dose Route Frequency Provider Last Rate Last Dose  . acetaminophen (TYLENOL) tablet 650 mg  650 mg Oral Q6H PRN Earney Navy, NP      . albuterol (PROVENTIL HFA;VENTOLIN HFA) 108 (90 BASE) MCG/ACT inhaler 2 puff  2 puff Inhalation Q4H PRN Earney Navy, NP      . alum & mag hydroxide-simeth (MAALOX/MYLANTA) 200-200-20 MG/5ML suspension 30 mL  30 mL Oral Q4H  PRN Earney Navy, NP      . ARIPiprazole (ABILIFY) tablet 5 mg  5 mg Oral Daily Rachael Fee, MD   5 mg at 09/07/15 0827  . citalopram (CELEXA) tablet 30 mg  30 mg Oral Daily Rachael Fee, MD   30 mg at 09/07/15 1610  . ibuprofen (ADVIL,MOTRIN) tablet 600 mg  600 mg Oral Q8H PRN Earney Navy, NP      . lamoTRIgine (LAMICTAL) tablet 25 mg  25 mg Oral Daily Rachael Fee, MD   25 mg at 09/07/15 9604  . magnesium hydroxide (MILK OF MAGNESIA) suspension 30 mL  30 mL Oral Daily PRN Earney Navy, NP      . ondansetron (ZOFRAN) tablet 4 mg  4 mg  Oral Q8H PRN Earney Navy, NP      . traZODone (DESYREL) tablet 50 mg  50 mg Oral QHS,MR X 1 Earney Navy, NP   50 mg at 09/06/15 2159    Lab Results: No results found for this or any previous visit (from the past 48 hour(s)).  Physical Findings: AIMS: Facial and Oral Movements Muscles of Facial Expression: None, normal Lips and Perioral Area: None, normal Jaw: None, normal Tongue: None, normal,Extremity Movements Upper (arms, wrists, hands, fingers): None, normal Lower (legs, knees, ankles, toes): None, normal, Trunk Movements Neck, shoulders, hips: None, normal, Overall Severity Severity of abnormal movements (highest score from questions above): None, normal Incapacitation due to abnormal movements: None, normal Patient's awareness of abnormal movements (rate only patient's report): No Awareness, Dental Status Current problems with teeth and/or dentures?: No Does patient usually wear dentures?: No  CIWA:  CIWA-Ar Total: 3 COWS:  COWS Total Score: 2  Musculoskeletal: Strength & Muscle Tone: within normal limits Gait & Station: normal Patient leans: normal  Psychiatric Specialty Exam: Review of Systems  Constitutional: Negative.   HENT: Negative.   Eyes: Negative.   Respiratory: Negative.   Cardiovascular: Negative.   Gastrointestinal: Negative.   Genitourinary: Negative.   Musculoskeletal: Negative.   Skin: Negative.   Neurological: Negative.   Endo/Heme/Allergies: Negative.   Psychiatric/Behavioral: Positive for depression, suicidal ideas (contracts for safety) and substance abuse. Negative for hallucinations. The patient is nervous/anxious.   All other systems reviewed and are negative.   Blood pressure 113/71, pulse 106, temperature 98.5 F (36.9 C), temperature source Oral, resp. rate 18, height  (1.803 m), weight 62.143 kg (137 lb), SpO2 98 %.Body mass index is 19.12 kg/(m^2).  General Appearance: Fairly Groomed  Patent attorney::  Fair  Speech:   Clear and Coherent  Volume:  Decreased  Mood:  Anxious and Depressed  Affect:  Restricted  Thought Process:  Coherent and Goal Directed  Orientation:  Full (Time, Place, and Person)  Thought Content:  symptoms events worries concerns  Suicidal Thoughts:  No  Homicidal Thoughts:  No  Memory:  Immediate;   Fair Recent;   Fair Remote;   Fair  Judgement:  Fair  Insight:  Present and Shallow  Psychomotor Activity:  Restlessness  Concentration:  Fair  Recall:  [employer] of Knowledge:Fair  Language: Fair  Akathisia:  No  Handed:  Right  AIMS (if indicated):     Assets:  Desire for Improvement Housing Social Support  ADL's:  Intact  Cognition: WNL  Sleep:  Number of Hours: 6.5   I have reviewed treatment plan on 09/07/15 and concur with the following changes.   Treatment Plan Summary: Daily contact with patient to  assess and evaluate symptoms and progress in treatment and Medication management Supportive approach/coping skills Alcohol dependence; continue Ativan detox protocol/work a relapse prevention plan Depression; continue the Celexa at 30 mg Hallucinations: continue the Abilify to 5 mg daily Mood instability; continue Lamictal 25 mg daily Work with CBT/mindfulness  Beau Fanny, FNP-BC 09/07/2015, 2:20 PM Agree with Progress Note as above  Nehemiah Massed, MD

## 2015-09-07 NOTE — Progress Notes (Signed)
D: Pt A & O X 4. Pt is guarded, minimal and appropriate interactions noted with peers and staff. Pt denies SI, HI, AVH and pain when assessed. Pt did not attend scheduled group this AM. Pt refused to complete self inventory sheet when offered. Pt awake in bed reading a book at present.  A: Encouragement provided to pt towards treatment compliance regarding group attendance. Medications administered as ordered with verbal education. Q 15 minutes checks remains effective for safety. Support and availability provided to pt during this shift.  R: Pt denied adverse drug reactions and withdrawal symptoms when assessed. Compliant with ordered medications when offered. Remains safe on and off unit. Plan of care continues.

## 2015-09-07 NOTE — [facility] Group Notes (Signed)
[facility] Group Notes:  (Nursing/MHT/Case Management/Adjunct)       Date:09/07/2015  Time: 09/07/15 Type of Therapy:  Psychoeducational Skills  Participation Level:  Did Not Attend  Summary of Progress/Problems: Pt did not attend scheduled group this AM. Encouraged to attend other groups this shift and next shift as it is part of his treatment plan.   Ouida Sills, Lusine Corlett 09/07/2015, 1000

## 2015-09-07 NOTE — [facility] Group Notes (Signed)
[facility] Group Notes: (Clinical Social Work)   09/07/2015      Type of Therapy:  Group Therapy   Participation Level:  Did Not Attend despite MHT prompting   Ambrose Mantle, LCSW 09/07/2015, 12:46 PM

## 2015-09-08 NOTE — Plan of Care (Signed)
Problem: Alteration in mood & ability to function due to Goal: LTG-Pt verbalizes understanding of importance of med regimen (Patient verbalizes understanding of importance of medication regimen and need to continue outpatient care and support groups)  Outcome: Not Progressing Patient refused his ability this morning.  States he does not want to take it.  States, "I feel better.  I'm not hearing any voices telling me to kill myself."

## 2015-09-08 NOTE — Progress Notes (Signed)
D: Patient is interacting more with staff.  He feels like he is ready for discharge.  He denies any depressive symptoms rating his depression as a 2; anxiety as a 2.  He denies any AH.  He did refuse his ability this morning stating, "I don't think I need it."  Attempted to educate the importance of taking all his medications and he continued to refuse it.  He denies any side effects from ability.  His goal today is to "work on letting go of mistakes."  He denies any SI/HI.   A: Continue to monitor medication management and MD orders.  Safety checks completed every 15 minutes per protocol.  Offer support and encouragement as needed. R: Patient's behavior is appropriate.

## 2015-09-08 NOTE — Progress Notes (Signed)
[facility] MD Progress Note  09/08/2015 10:50 AM Dalton [name]  MRN:  [id number] Subjective:  Pt states: "I'm doing a lot better today and I feel like I could really go home."  Objective: Pt seen and chart reviewed. Pt reports that his suicidal ideation ahs resolved. He is asking to discharge. However, pt was suicidal yesterday and appeared to be very depressed. Although he has improved greatly today, we need to be sure that this is persistent rather than transient or due to secondary gain upon discharge Pt cites good sleep and appetite and denies any current side effects. He does deny psychosis today and does not appear to be responding to internal stimuli.   Principal Problem: Severe recurrent major depressive disorder with psychotic features (HCC) Diagnosis:   Patient Active Problem List   Diagnosis Date Noted  . Severe recurrent major depressive disorder with psychotic features (HCC) [F33.3]     Priority: High  . Alcohol use disorder, severe, dependence (HCC) [F10.20] 02/13/2015    Priority: High  . 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 Alcohol withdrawal syndrome without complication (HCC) [F10.230] 02/13/2015  . Opioid use disorder, severe, dependence (HCC) [F11.20] 02/13/2015  . Opioid withdrawal (HCC) [F11.23] 02/13/2015  . Opiate dependence, continuous (HCC) [F11.20] 10/13/2014  . 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 Eyelid abnormality [H02.9] 03/12/2012  . DIABETES MELLITUS, TYPE II [E11.9] 11/10/2010  . ABSCESS, GLUTEAL [IMO0002] 10/20/2010  . CERVICAL LYMPHADENOPATHY [R59.9] 05/16/2010  . 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 VIRAL URI [J06.9] 10/02/2008  . PLEURAL EFFUSION, LEFT [J90] 03/22/2008  . ALLERGIC RHINITIS [J30.9] 02/01/2008  . ASTHMA [J45.909] 02/01/2008   Total Time spent with patient: 15 minutes  Past Psychiatric History: see Admission H&P  Past Medical History:  Past  Medical History  Diagnosis Date  . Allergy   . Asthma   . Pulmonary tuberculosis     Past Surgical History  Procedure Laterality Date  . Tonsillectomy     Family History: History reviewed. No pertinent family history. Family Psychiatric  History: see Admission H and PE Social History:  History  Alcohol Use No     History  Drug Use No    Comment: former substance abuse    Social History   Social History  . Marital Status: Single    Spouse Name: N/A  . Number of Children: N/A  . Years of Education: N/A   Social History Main Topics  . Smoking status: Current Every Day Smoker -- 0.50 packs/day    Types: Cigarettes  . Smokeless tobacco: Never Used     Comment: 4 ciggs qd  . Alcohol Use: No  . Drug Use: No     Comment: former substance abuse  . Sexual Activity: Not Asked   Other Topics Concern  . None   Social History Narrative   Additional Social History:                         Sleep: Fair  Appetite:  Fair  Current Medications: Current Facility-Administered Medications  Medication Dose Route Frequency Provider Last Rate Last Dose  . acetaminophen (TYLENOL) tablet 650 mg  650 mg Oral Q6H PRN Earney Navy, NP   650 mg at 09/07/15 1834  . albuterol (PROVENTIL HFA;VENTOLIN HFA) 108 (90 BASE) MCG/ACT inhaler 2 puff  2 puff Inhalation Q4H PRN Earney Navy, NP      . alum & mag  hydroxide-simeth (MAALOX/MYLANTA) 200-200-20 MG/5ML suspension 30 mL  30 mL Oral Q4H PRN Earney Navy, NP      . ARIPiprazole (ABILIFY) tablet 5 mg  5 mg Oral Daily Rachael Fee, MD   5 mg at 09/07/15 0827  . citalopram (CELEXA) tablet 30 mg  30 mg Oral Daily Rachael Fee, MD   30 mg at 09/08/15 0756  . hydrOXYzine (ATARAX/VISTARIL) tablet 50 mg  50 mg Oral Q6H PRN Beau Fanny, FNP   50 mg at 09/07/15 2120  . ibuprofen (ADVIL,MOTRIN) tablet 600 mg  600 mg Oral Q8H PRN Earney Navy, NP      . lamoTRIgine (LAMICTAL) tablet 25 mg  25 mg Oral Daily Rachael Fee, MD   25 mg at 09/08/15 0756  . magnesium hydroxide (MILK OF MAGNESIA) suspension 30 mL  30 mL Oral Daily PRN Earney Navy, NP      . nicotine (NICODERM CQ - dosed in mg/24 hours) patch 21 mg  21 mg Transdermal Daily Rachael Fee, MD   21 mg at 09/07/15 2130  . ondansetron (ZOFRAN) tablet 4 mg  4 mg Oral Q8H PRN Earney Navy, NP      . traZODone (DESYREL) tablet 50 mg  50 mg Oral QHS,MR X 1 Earney Navy, NP   50 mg at 09/07/15 2200    Lab Results: No results found for this or any previous visit (from the past 48 hour(s)).  Physical Findings: AIMS: Facial and Oral Movements Muscles of Facial Expression: None, normal Lips and Perioral Area: None, normal Jaw: None, normal Tongue: None, normal,Extremity Movements Upper (arms, wrists, hands, fingers): None, normal Lower (legs, knees, ankles, toes): None, normal, Trunk Movements Neck, shoulders, hips: None, normal, Overall Severity Severity of abnormal movements (highest score from questions above): None, normal Incapacitation due to abnormal movements: None, normal Patient's awareness of abnormal movements (rate only patient's report): No Awareness, Dental Status Current problems with teeth and/or dentures?: No Does patient usually wear dentures?: No  CIWA:  CIWA-Ar Total: 2 COWS:  COWS Total Score: 2  Musculoskeletal: Strength & Muscle Tone: within normal limits Gait & Station: normal Patient leans: normal  Psychiatric Specialty Exam: Review of Systems  Constitutional: Negative.   HENT: Negative.   Eyes: Negative.   Respiratory: Negative.   Cardiovascular: Negative.   Gastrointestinal: Negative.   Genitourinary: Negative.   Musculoskeletal: Negative.   Skin: Negative.   Neurological: Negative.   Endo/Heme/Allergies: Negative.   Psychiatric/Behavioral: Positive for depression and substance abuse. Negative for suicidal ideas (contracts for safety) and hallucinations. The patient is nervous/anxious.   [name]  other systems reviewed and are negative.   Blood pressure 115/82, pulse 112, temperature 98.3 F (36.8 C), temperature source Oral, resp. rate 16, height  (1.803 m), weight 62.143 kg (137 lb), SpO2 98 %.Body mass index is 19.12 kg/(m^2).  General Appearance: Fairly Groomed  Patent attorney::  Fair  Speech:  Clear and Coherent  Volume:  Decreased  Mood:  Anxious and Depressed  Affect:  Restricted  Thought Process:  Coherent and Goal Directed  Orientation:  Full (Time, Place, and Person)  Thought Content:  symptoms events worries concerns  Suicidal Thoughts:  No  Homicidal Thoughts:  No  Memory:  Immediate;   Fair Recent;   Fair Remote;   Fair  Judgement:  Fair  Insight:  Present and Shallow  Psychomotor Activity:  Restlessness  Concentration:  Fair  Recall:  [employer] of Knowledge:Fair  Language: Fair  Akathisia:  No  Handed:  Right  AIMS (if indicated):     Assets:  Desire for Improvement Housing Social Support  ADL's:  Intact  Cognition: WNL  Sleep:  Number of Hours: 5.75   I have reviewed treatment plan on 09/08/15 and concur with the following changes.   Treatment Plan Summary: Daily contact with patient to assess and evaluate symptoms and progress in treatment and Medication management Supportive approach/coping skills Alcohol dependence; continue Ativan detox protocol/work a relapse prevention plan Depression; continue the Celexa at 30 mg Hallucinations: continue the Abilify to 5 mg daily Mood instability; continue Lamictal 25 mg daily Work with CBT/mindfulness  Dalton [name], Dalton All, FNP-BC 09/08/2015, 10:50 AM Agree with Progress Note as above  Nehemiah Massed, MD

## 2015-09-08 NOTE — [facility] Group Notes (Signed)
[facility] Group Notes:  (Nursing/MHT/Case Management/Adjunct)  Date:  09/08/2015  Time:  0915 am  Type of Therapy:  Psychoeducational Skills  Participation Level:  Minimal  Participation Quality:  Appropriate and Attentive  Affect:  Appropriate  Cognitive:  Alert and Appropriate  Insight:  Appropriate  Engagement in Group:  Engaged  Modes of Intervention:  Support  Summary of Progress/Problems:   Cranford Mon 09/08/2015, 10:33 AM

## 2015-09-08 NOTE — [facility] Group Notes (Signed)
[facility] Group Notes:  (Clinical Social Work)  09/08/2015  10:00-11:00AM  Summary of Progress/Problems:   The main focus of today's process group was to   1)  discuss the importance of adding supports  2)  define health supports versus unhealthy supports  3)  identify the patient's current unhealthy supports and plan how to handle them  4)  Identify the patient's current healthy supports and plan what to add.  An emphasis was placed on using counselor, doctor, therapy groups, 12-step groups, and problem-specific support groups to expand supports.    The patient expressed full comprehension of the concepts presented, and agreed that there is a need to add more supports.  The patient stated his mother, girlfriend, daughter, and whole family except one or two cousins are healthy supports.  Everybody he grew up with is unhealthy for him.  He remained stuck throughout group on his need to be there for these friends, and not "desert" them although they do not understand and will entice him to use around them.  He stated that he is also angry with 2 friends who have been pursuing sobriety, and thus have cut him off, said they cannot be around him.  He was not receptive to feedback.  He did say he wants to get a sponsor.  Type of Therapy:  Process Group with Motivational Interviewing  Participation Level:  Active  Participation Quality:  Attentive and Sharing  Affect:  Angry and Blunted  Cognitive:  Alert, Appropriate and Oriented  Insight:  Developing/Improving  Engagement in Therapy:  Engaged  Modes of Intervention:   Education, Support and Processing, Activity  Dalton Mantle, LCSW 09/08/2015

## 2015-09-09 MED ORDER — ALBUTEROL SULFATE HFA 108 (90 BASE) MCG/ACT IN AERS: 2.0000 | INHALATION_SPRAY | RESPIRATORY_TRACT | Status: DC | PRN

## 2015-09-09 MED ORDER — NICOTINE 21 MG/24HR TD PT24: 21.0000 mg | MEDICATED_PATCH | Freq: Every day | TRANSDERMAL | Status: DC

## 2015-09-09 MED ORDER — CITALOPRAM HYDROBROMIDE 10 MG PO TABS: 30.0000 mg | ORAL_TABLET | Freq: Every day | ORAL | Status: DC

## 2015-09-09 MED ORDER — LAMOTRIGINE 25 MG PO TABS: 25.0000 mg | ORAL_TABLET | Freq: Every day | ORAL | Status: DC

## 2015-09-09 MED ORDER — HYDROXYZINE HCL 50 MG PO TABS: 50.0000 mg | ORAL_TABLET | Freq: Four times a day (QID) | ORAL | Status: DC | PRN

## 2015-09-09 MED ORDER — TRAZODONE HCL 50 MG PO TABS: 50.0000 mg | ORAL_TABLET | Freq: Every evening | ORAL | Status: DC | PRN

## 2015-09-09 MED ORDER — ARIPIPRAZOLE 5 MG PO TABS: 5.0000 mg | ORAL_TABLET | Freq: Every day | ORAL | Status: DC

## 2015-09-09 NOTE — Discharge Summary (Signed)
Physician Discharge Summary Note  Patient:  Dalton [name] is an 29 y.o., male MRN:  [id number] DOB:  Apr 10, 1986 Patient phone:  [phone] (home)  Patient address:   [address] [place] [address],  Total Time spent with patient: Greater than 30 minutes  Date of Admission:  09/03/2015  Date of Discharge: 02/15/15  Reason for Admission:  Drug detox, suicidal ideations  Principal Problem: Severe recurrent major depressive disorder with psychotic features Dalton [name])  Discharge Diagnoses: Patient Active Problem List   Diagnosis Date Noted  . 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 Alcohol use disorder, severe, dependence (HCC) [F10.20] 02/13/2015  . Alcohol withdrawal syndrome without complication (HCC) [F10.230] 02/13/2015  . Opioid use disorder, severe, dependence (HCC) [F11.20] 02/13/2015  . Opioid withdrawal (HCC) [F11.23] 02/13/2015  . Opiate dependence, continuous (HCC) [F11.20] 10/13/2014  . 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 Eyelid abnormality [H02.9] 03/12/2012  . DIABETES MELLITUS, TYPE II [E11.9] 11/10/2010  . ABSCESS, GLUTEAL [IMO0002] 10/20/2010  . CERVICAL LYMPHADENOPATHY [R59.9] 05/16/2010  . 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 VIRAL URI [J06.9] 10/02/2008  . PLEURAL EFFUSION, LEFT [J90] 03/22/2008  . ALLERGIC RHINITIS [J30.9] 02/01/2008  . ASTHMA [J45.909] 02/01/2008   Musculoskeletal: Strength & Muscle Tone: within normal limits Gait & Station: normal Patient leans: N/A  Psychiatric Specialty Exam: Physical Exam  Psychiatric: His speech is normal and behavior is normal. Judgment and thought content normal. His mood appears not anxious. His affect is not angry, not blunt, not labile and not inappropriate. Cognition and memory are normal. He does not exhibit a depressed mood.     Review of Systems  Constitutional: Negative.   HENT: Negative.   Eyes: Negative.   Respiratory: Negative.   Cardiovascular: Negative.   Gastrointestinal: Negative.   Genitourinary: Negative.   Musculoskeletal: Negative.   Skin: Negative.   Neurological: Negative.   Endo/Heme/Allergies: Negative.   Psychiatric/Behavioral: Positive for depression (stable) and substance abuse (Polysubstance dependence). Negative for suicidal ideas, hallucinations and memory loss. The patient has insomnia (Stable). The patient is not nervous/anxious.     Blood pressure 120/87, pulse 79, temperature 97.5 F (36.4 C), temperature source Oral, resp. rate 20, height  (1.803 m), weight 62.143 kg (137 lb), SpO2 98 %.Body mass index is 19.12 kg/(m^2).  See MD's SRA   Past Medical History:  Past Medical History  Diagnosis Date  . Allergy   . Asthma   . Pulmonary tuberculosis     Past Surgical History  Procedure Laterality Date  . Tonsillectomy     Family History: History reviewed. No pertinent family history.  Social History:  History  Alcohol Use No     History  Drug Use No    Comment: former substance abuse    Social History   Social History  . Marital Status: Single    Spouse Name: N/A  . Number of Children: N/A  . Years of Education: N/A   Social History Main Topics  . Smoking status: Current Every Day Smoker -- 0.50 packs/day    Types: Cigarettes  . Smokeless tobacco: Never Used     Comment: 4 ciggs qd  . Alcohol Use: No  . Drug Use: No     Comment: former substance abuse  . Sexual Activity: Not Asked   Other Topics Concern  . None   Social History Narrative  Risk to Self: Is patient at risk for suicide?: Yes  Risk to Others: No Prior Inpatient Therapy: Yes Prior Outpatient Therapy: Yes  Level of Care:  OP  [name] Course: 29 Y/o male who states he has substance abuse problems. Drinking alcohol 3-4 40's every day and still doing opioids when he can get them.  Admits to persistent depression and states he would like to deal with the depression this time around as thinks the depression keeps his alcohol and drug use going. States he has depression and he hears a voice inside his head, not his own voice who tell him to hurt himself and hurt others. States that he has enough control where he is not going to hurt other people but does not teel the same way about hurting himself. States he would like to get to the bottom of it. states he does not remember things before 29 Y/O. Has been given Celexa what he does not take once he gets out of here.  Dalton [name] was admitted to the [name] with his toxicology test results showing a BAL of 259. He reports having been drinking heavily. He was also presenting with worsening symptoms of depression. He was here for alcohol detox/mood stabilization treatments. His detoxification treatment was achieved using Ativan detox regimen on a tapering dose format.This was used in place of Librium detox protocols because Librium is a long acting benzodiazepine with a long half life. If used for this detox treatment will leave a lingering adverse effects in his systems. By using Dalton [name] received a cleaner detoxification treatment without the lingering adverse effects of the librium capsules. He was also enrolled in the group counseling sessions, AA/NA meetings being offered and held on this unit. He participated and learned coping skills. Dalton [name] presented no other significant medical conditions that required treatment or monitoring. He tolerated his treatment regimen without any significant adverse effects and or reactions reported.  Besides the detoxification treatments; Dalton [name] was also medicated & discharged on; Abilify 5 mg for mood control, Citalopram 10 mg for depression, Hydroxyzine 50 mg for anxiety, Lamictal 25 mg for mood stabilization & trazodone 50 mg for insomnia. Dalton [name] has completed detox treatment and his mood is stable. This is  evidenced by his reports of improved mood and absence substance withdrawal symptoms. He will resume psychiatric care, routine medication management & substance abuse treatments as noted below. He was provided with all the necessary information needed to make these appointments without problems. He was encouraged to join/attend AA/NA meetings being offered and held within his community.   Upon discharge, Kashis adamantly denies any suicidal, homicidal ideations, auditory, visual hallucinations, delusional thoughts, paranoia and or substance withdrawal symptoms. He [facility] with all personal belongings in no apparent distress. He received a 7 days worth supply samples of his [facility] discharge medications provided by [facility] pharmacy. Transportation per mom..  Consults:  psychiatry  Significant Diagnostic Studies:  labs: CBC with diff, CMP, UDS, toxicology tests, U/A, results reviewed, stable  Discharge Vitals:   Blood pressure 120/87, pulse 79, temperature 97.5 F (36.4 C), temperature source Oral, resp. rate 20, height 5\' 11"  (1.803 m), weight 62.143 kg (137 lb), SpO2 98 %. Body mass index is 19.12 kg/(m^2). Lab Results:   No results found for this or any previous visit (from the past 72 hour(s)).  Physical Findings: AIMS: Facial and Oral Movements Muscles of Facial Expression: None, normal Lips and Perioral Area: None, normal Jaw: None, normal Tongue: None, normal,Extremity Movements Upper (arms, wrists, hands,  fingers): None, normal Lower (legs, knees, ankles, toes): None, normal, Trunk Movements Neck, shoulders, hips: None, normal, Overall Severity Severity of abnormal movements (highest score from questions above): None, normal Incapacitation due to abnormal movements: None, normal Patient's awareness of abnormal movements (rate only patient's report): No Awareness, Dental Status Current problems with teeth and/or dentures?: No Does patient usually wear dentures?: No  CIWA:  CIWA-Ar Total:  1 COWS:  COWS Total Score: 2   See Psychiatric Specialty Exam and Suicide Risk Assessment completed by Attending Physician prior to discharge.  Discharge destination:  Home  Is patient on multiple antipsychotic therapies at discharge:  No   Has Patient had three or more failed trials of antipsychotic monotherapy by history:  No  Recommended Plan for Multiple Antipsychotic Therapies: NA    Medication List    TAKE these medications      Indication   albuterol 108 (90 BASE) MCG/ACT inhaler  Commonly known as:  PROVENTIL HFA;VENTOLIN HFA  Inhale 2 puffs into the lungs every 4 (four) hours as needed for wheezing or shortness of breath.   Indication:  Asthma     ARIPiprazole 5 MG tablet  Commonly known as:  ABILIFY  Take 1 tablet (5 mg total) by mouth daily. For mood control   Indication:  Mood control     citalopram 10 MG tablet  Commonly known as:  CELEXA  Take 3 tablets (30 mg total) by mouth daily. For depression   Indication:  Depression     hydrOXYzine 50 MG tablet  Commonly known as:  ATARAX/VISTARIL  Take 1 tablet (50 mg total) by mouth every 6 (six) hours as needed for anxiety.   Indication:  Anxiety     lamoTRIgine 25 MG tablet  Commonly known as:  LAMICTAL  Take 1 tablet (25 mg total) by mouth daily. For mood stabilization   Indication:  Mood stabilization     nicotine 21 mg/24hr patch  Commonly known as:  NICODERM CQ - dosed in mg/24 hours  Place 1 patch (21 mg total) onto the skin daily. For smoking cessation   Indication:  Nicotine Addiction     traZODone 50 MG tablet  Commonly known as:  DESYREL  Take 1 tablet (50 mg total) by mouth at bedtime and may repeat dose one time if needed. For sleep   Indication:  Trouble Sleeping       Follow-up Information    Follow up with [facility] On 09/12/2015.   Why:  Walk in between 8am-9am Monday through Friday for [name] follow-up/medication managment/assessment for mental health services.    Contact information:    [address] [place] [address] Phone: [phone] Fax: [phone]      [name]   Why:  Appt on this date at 10:00AM with Tawanna Sat for therapy. Please make sure to cancel or reschedule if needed within 48 hours of appt. Thank you.    Contact information:   [facility] [address], [place]  [address] Phone: [phone] Fax: [phone]     Follow-up recommendations: Activity:  As tolerated Diet: As recommended by your primary care doctor. Keep all scheduled follow-up appointments as recommended.    Comments: Take all your medications as prescribed by your mental healthcare provider. Report any adverse effects and or reactions from your medicines to your outpatient provider promptly. Patient is instructed and cautioned to not engage in alcohol and or illegal drug use while on prescription medicines.  In the event of worsening symptoms, patient is instructed to call the crisis hotline, 911 and or go to the nearest ED for appropriate evaluation and treatment of symptoms. Follow-up with your primary care provider for your other medical issues, concerns and or health care needs.   Total Discharge Time: Greater than 30 minutes  Signed: Sanjuana Kava, PMHNP, FNP-C 09/09/2015, 10:27 AM  I personally assessed the patient and formulated the plan Madie Reno A. Dub Mikes, M.D.

## 2015-09-09 NOTE — Progress Notes (Signed)
The patient attended last evening's A.A. Meeting and was appropriate.  

## 2015-09-09 NOTE — [facility] Suicide Risk Assessment (Signed)
[facility] Discharge Suicide Risk Assessment   Demographic Factors:  Male and Adolescent or young adult  Total Time spent with patient: 30 minutes  Musculoskeletal: Strength & Muscle Tone: within normal limits Gait & Station: normal Patient leans: normal  Psychiatric Specialty Exam: Physical Exam  Review of Systems  Constitutional: Negative.   HENT: Negative.   Eyes: Negative.   Respiratory: Negative.   Cardiovascular: Negative.   Gastrointestinal: Negative.   Genitourinary: Negative.   Musculoskeletal: Negative.   Skin: Negative.   Neurological: Negative.   Endo/Heme/Allergies: Negative.   Psychiatric/Behavioral: Positive for depression and substance abuse.    Blood pressure 120/87, pulse 79, temperature 97.5 F (36.4 C), temperature source Oral, resp. rate 20, height  (1.803 m), weight 62.143 kg (137 lb), SpO2 98 %.Body mass index is 19.12 kg/(m^2).  General Appearance: Fairly Groomed  Patent attorney::  Fair  Speech:  Clear and Coherent409  Volume:  Normal  Mood:  Euthymic  Affect:  Appropriate  Thought Process:  Coherent and Goal Directed  Orientation:  Full (Time, Place, and Person)  Thought Content:  plans as he moves on, relapse prevention plan  Suicidal Thoughts:  No  Homicidal Thoughts:  No  Memory:  Immediate;   Fair Recent;   Fair Remote;   Fair  Judgement:  Fair  Insight:  Present  Psychomotor Activity:  Normal  Concentration:  Fair  Recall:  [employer] of Knowledge:Fair  Language: Fair  Akathisia:  No  Handed:  Right  AIMS (if indicated):     Assets:  Desire for Improvement Housing Social Support  Sleep:  Number of Hours: 6.5  Cognition: WNL  ADL's:  Intact   Have you used any form of tobacco in the last 30 days? (Cigarettes, Smokeless Tobacco, Cigars, and/or Pipes): Yes  Has this patient used any form of tobacco in the last 30 days? (Cigarettes, Smokeless Tobacco, Cigars, and/or Pipes) Yes, A prescription for an FDA-approved tobacco cessation  medication was offered at discharge and the patient refused  Mental Status Per Nursing Assessment::   On Admission:  NA  Current Mental Status by Physician: In full contact with reality. There are no active S/S of withdrawal. There are no active SI plans or intent. He states he is feeling much better. States his depression is better he is not hearing the voices and agrees to work on abstaining from drinking alcohol   Loss Factors: NA  Historical Factors: Impulsivity  Risk Reduction Factors:   Sense of responsibility to family, Living with another person, especially a relative and Positive social support  Continued Clinical Symptoms:  Depression:   Comorbid alcohol abuse/dependence Alcohol/Substance Abuse/Dependencies  Cognitive Features That Contribute To Risk:  Closed-mindedness, Polarized thinking and Thought constriction (tunnel vision)    Suicide Risk:  Minimal: No identifiable suicidal ideation.  Patients presenting with no risk factors but with morbid ruminations; may be classified as minimal risk based on the severity of the depressive symptoms  Principal Problem: Severe recurrent major depressive disorder with psychotic features [facility]) Discharge Diagnoses:  Patient Active Problem List   Diagnosis Date Noted  . 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 Alcohol use disorder, severe, dependence (HCC) [F10.20] 02/13/2015  . Alcohol withdrawal syndrome without complication (HCC) [F10.230] 02/13/2015  . Opioid use disorder, severe, dependence (HCC) [F11.20] 02/13/2015  . Opioid withdrawal (HCC) [F11.23] 02/13/2015  . Opiate dependence, continuous (HCC) [  F11.20] 10/13/2014  . 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 Eyelid abnormality [H02.9] 03/12/2012  . DIABETES MELLITUS, TYPE II [E11.9] 11/10/2010  . ABSCESS, GLUTEAL  [IMO0002] 10/20/2010  . CERVICAL LYMPHADENOPATHY [R59.9] 05/16/2010  . 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 VIRAL URI [J06.9] 10/02/2008  . PLEURAL EFFUSION, LEFT [J90] 03/22/2008  . ALLERGIC RHINITIS [J30.9] 02/01/2008  . ASTHMA [J45.909] 02/01/2008    Follow-up Information    Follow up with [facility] On 09/12/2015.   Why:  Walk in between 8am-9am Monday through Friday for hospital follow-up/medication managment/assessment for mental health services.    Contact information:   [address] [place] [address] Phone: [phone] Fax: [phone]      Follow up with [facility] .   Why:  Appt on this date at 10:00AM with Tawanna Sat for therapy. Please make sure to cancel or reschedule if needed within 48 hours of appt. Thank you.    Contact information:   [facility] [address] [place]  [address] Phone: [phone] Fax: [phone]      Plan Of Care/Follow-up recommendations:  Activity:  as tolerated Diet:  regular Follow up [facility] Is patient on multiple antipsychotic therapies at discharge:  No   Has Patient had three or more failed trials of antipsychotic monotherapy by history:  No  Recommended Plan for Multiple Antipsychotic Therapies: NA    Brysun Eschmann A 09/09/2015, 2:05 PM

## 2015-09-09 NOTE — Progress Notes (Signed)
Pt reports he is doing fine this evening.  He attended evening AA group.  Pt denies SI/HI/AVH.  He denies having any withdrawal symptoms this evening.  He has been quiet and withdrawn.  He sat in the dayroom after group with little to no interaction with his peers.  He says he may be discharged tomorrow, but did not disclose where he would be going. Pt was encouraged to make his needs known to staff.  Pt voices no needs or concerns, but did request his second dose of Trazodone prior to going to bed.  Support and encouragement offered.  Safety maintained with q15 minute checks.

## 2015-09-09 NOTE — Tx Team (Signed)
Interdisciplinary Treatment Plan Update (Adult)  Date:  09/09/2015  Time Reviewed:  10:23 AM   Progress in Treatment: Attending groups: Yes. Participating in groups:  Yes. Taking medication as prescribed:  Yes. Tolerating medication:  Yes. Family/Significant othe contact made:  SPE completed with pt, as he refused to consent to family contact.  Patient understands diagnosis:  Yes. and As evidenced by:  seeking treatment for depression; minimizing substance abuse-alcohol/opiates.  Discussing patient identified problems/goals with staff:  Yes. Medical problems stabilized or resolved:  Yes. Denies suicidal/homicidal ideation: Yes. Issues/concerns per patient self-inventory:  Other:  New problem(s) identified:    Discharge Plan or Barriers: Pt plans to return home with his girlfriend and will be picked up by his mother today. [facility] for med management and appt made for counseling at [facility].   Reason for Continuation of Hospitalization: none  Comments:  Dalton [name] is an 29 y.o. male who presented to [facility] with increasing suicidal ideations and multiple plans. He reported a 5 year history of opiate and alcohol abuse. He stated that he drinks 2 40 oz beers daily and takes 75. - 100 mg opiates (percocet or vicodin) when he has access to them.Patient attended inpatient in [place] for substance abuse in November of last year and was in patient at [facility] in March of this year. He currently reports feelings of sadness, crying, anxiety, problems concentrating, difficulty with his long term memory, irritability, and only sleeps if he is using a substance or drinking alcohol. Patient stated that he frequently "smacks or punches" himself and believes that he has "messed up" his life. He reported that his life is "spiraling out of control" and he is feeling "hopeless"Patient does not have any past suicide attempts but stated that he thinks about suicide often. He reported that when  he is driving he thinks about running his car off of the highway or into a tree. He reported that due to his drug and alcohol abuse he has lost his job and has come into financial difficulty. He was caught stealing at [employer] and is now facing a Scientist, research (life sciences). Diagnosis: Axis I Opiod Use Disorder severe Alcohol use Disorder severe, Substance Induced Depressive Disorder  Estimated length of stay:  D/c today   Additional Comments:  Patient and CSW reviewed pt's identified goals and treatment plan. Patient verbalized understanding and agreed to treatment plan. CSW reviewed [facility] "Discharge Process and Patient Involvement" Form. Pt verbalized understanding of information provided and signed form.    Review of initial/current patient goals per problem list:  1. Goal(s): Patient will participate in aftercare plan  Met: Yes   Target date: at discharge  As evidenced by: Patient will participate within aftercare plan AEB aftercare provider and housing plan at discharge being identified.  9/28: CSW assessing for appropriate referrals.   10/3: Pt to return home with girlfriend and has follow up for med management at [facility]; counseling at the [place].   2. Goal (s): Patient will exhibit decreased depressive symptoms and suicidal ideations.  Met: Yes    Target date: at discharge  As evidenced by: Patient will utilize self rating of depression at 3 or below and demonstrate decreased signs of depression or be deemed stable for discharge by MD.  9/28: Pt rates depression as 10 and presents with depressed mood and flat affect. Denies SI/HI/AVH.   10/3: Pt rates depression as 0/10 and presents with pleasant mood and calm affect. Denies SI/Hi/AVH.   4. Goal(s): Patient  will demonstrate decreased signs of withdrawal due to substance abuse  Met: Yes  Target date:at discharge   As evidenced by: Patient will produce a CIWA/COWS score of 0, have stable vitals signs, and no  symptoms of withdrawal.  9/28: Pt denies withdrawal symptoms. Stable vitals. No CIWA/COWS score.    Attendees: Patient:   09/09/2015 10:23 AM   Family:   09/09/2015 10:23 AM   Physician:  Dr. Carlton Adam, MD 09/09/2015 10:23 AM   Nursing:   Warner Mccreedy RN 09/09/2015 10:23 AM   Clinical Social Worker: Maxie Better, [place]  09/09/2015 10:23 AM   Clinical Social Worker:  Peri Maris LCSWA 09/09/2015 10:23 AM   Other:  Gerline Legacy Nurse Case Manager 09/09/2015 10:23 AM   Other:  Lucinda Dell; [facility]  09/09/2015 10:23 AM   Other:   09/09/2015 10:23 AM   Other:  09/09/2015 10:23 AM   Other:  09/09/2015 10:23 AM   Other:  09/09/2015 10:23 AM    09/09/2015 10:23 AM    09/09/2015 10:23 AM    09/09/2015 10:23 AM    09/09/2015 10:23 AM    Scribe for Treatment Team:   Maxie Better, [place]  09/09/2015 10:23 AM

## 2015-09-09 NOTE — [facility] Suicide Risk Assessment (Signed)
[facility] INPATIENT:  Family/Significant Other Suicide Prevention Education  Suicide Prevention Education:  Patient Refusal for Family/Significant Other Suicide Prevention Education: The patient Dalton [name] has refused to provide written consent for family/significant other to be provided Family/Significant Other Suicide Prevention Education during admission and/or prior to discharge.  Physician notified.  SPE completed with pt, as pt refused to consent to family contact. SPI pamphlet provided to pt and pt was encouraged to share information with support network, ask questions, and talk about any concerns relating to SPE. Pt denies access to guns/firearms and verbalized understanding of information provided. Mobile Crisis information also provided to pt.   Smart, Yardley Beltran LCSWA  09/09/2015, 10:22 AM

## 2015-09-09 NOTE — Progress Notes (Signed)
Discharge note:  Patient received all personal belongings, prescriptions and medication samples.  Patient denies SI/HI/AVH.  Reviewed discharge instructions with patient and follow up appointments.  Patient left ambulatory with his mother.

## 2015-09-09 NOTE — Progress Notes (Signed)
  [facility] Adult Case Management Discharge Plan :  Will you be returning to the same living situation after discharge:  Yes,  return home At discharge, do you have transportation home?: Yes,  mother  Do you have the ability to pay for your medications: Yes, mental health  Release of information consent forms completed and submitted to medical Records by CSW.  Patient to Follow up at: Follow-up Information    Follow up with [facility] On 09/12/2015.   Why:  Walk in between 8am-9am Monday through Friday for hospital follow-up/medication managment/assessment for mental health services.    Contact information:   [address] [place] [address] Phone: [phone] Fax: [phone]      Follow up with [facility] .   Why:  Appt on this date at 10:00AM with Tawanna Sat for therapy. Please make sure to cancel or reschedule if needed within 48 hours of appt. Thank you.    Contact information:   [facility] [address], [place]  [address] Phone: [phone] Fax: [phone]      Patient denies SI/HI: Yes,  during group/self report.     Safety Planning and Suicide Prevention discussed: Yes,  SPE completed with pt, as he refused to consent to family contact. SPI pamphlet provided to pt and he was encouraged to share information with support network, ask questions, and talk about any concerns relating to SPE.  Have you used any form of tobacco in the last 30 days? (Cigarettes, Smokeless Tobacco, Cigars, and/or Pipes): Yes  Has patient been referred to the Quitline?: Patient refused referral  Smart, Lebron Quam  09/09/2015, 10:25 AM

## 2016-01-29 ENCOUNTER — Encounter (HOSPITAL_COMMUNITY): Payer: Self-pay | Admitting: Cardiology

## 2016-01-29 ENCOUNTER — Emergency Department (HOSPITAL_COMMUNITY)
Admission: EM | Admit: 2016-01-29 | Discharge: 2016-01-30 | Disposition: A | Discharge status: Home / Self Care | Payer: Self-pay | Attending: Emergency Medicine | Admitting: Emergency Medicine

## 2016-01-29 DIAGNOSIS — F17210 Nicotine dependence, cigarettes, uncomplicated: Secondary | ICD-10-CM | POA: Insufficient documentation

## 2016-01-29 DIAGNOSIS — F315 Bipolar disorder, current episode depressed, severe, with psychotic features: Secondary | ICD-10-CM | POA: Insufficient documentation

## 2016-01-29 DIAGNOSIS — F1310 Sedative, hypnotic or anxiolytic abuse, uncomplicated: Secondary | ICD-10-CM | POA: Insufficient documentation

## 2016-01-29 DIAGNOSIS — Z8611 Personal history of tuberculosis: Secondary | ICD-10-CM | POA: Insufficient documentation

## 2016-01-29 DIAGNOSIS — F1410 Cocaine abuse, uncomplicated: Secondary | ICD-10-CM | POA: Insufficient documentation

## 2016-01-29 DIAGNOSIS — F333 Major depressive disorder, recurrent, severe with psychotic symptoms: Secondary | ICD-10-CM

## 2016-01-29 DIAGNOSIS — Z79899 Other long term (current) drug therapy: Secondary | ICD-10-CM | POA: Insufficient documentation

## 2016-01-29 DIAGNOSIS — F1994 Other psychoactive substance use, unspecified with psychoactive substance-induced mood disorder: Secondary | ICD-10-CM | POA: Diagnosis present

## 2016-01-29 DIAGNOSIS — J45909 Unspecified asthma, uncomplicated: Secondary | ICD-10-CM | POA: Insufficient documentation

## 2016-01-29 LAB — RAPID URINE DRUG SCREEN, HOSP PERFORMED
AMPHETAMINES: NOT DETECTED
BENZODIAZEPINES: POSITIVE — AB
Barbiturates: NOT DETECTED
COCAINE: POSITIVE — AB
Opiates: NOT DETECTED
Tetrahydrocannabinol: NOT DETECTED

## 2016-01-29 LAB — COMPREHENSIVE METABOLIC PANEL
ALBUMIN: 4.5 g/dL (ref 3.5–5.0)
ALT: 23 U/L (ref 17–63)
ANION GAP: 14 (ref 5–15)
AST: 27 U/L (ref 15–41)
Alkaline Phosphatase: 64 U/L (ref 38–126)
BILIRUBIN TOTAL: 0.6 mg/dL (ref 0.3–1.2)
BUN: 10 mg/dL (ref 6–20)
CHLORIDE: 99 mmol/L — AB (ref 101–111)
CO2: 28 mmol/L (ref 22–32)
Calcium: 10.4 mg/dL — ABNORMAL HIGH (ref 8.9–10.3)
Creatinine, Ser: 0.92 mg/dL (ref 0.61–1.24)
GFR calc Af Amer: >60 mL/min (ref >60)
GLUCOSE: 97 mg/dL (ref 65–99)
Potassium: 4.4 mmol/L (ref 3.5–5.1)
Sodium: 141 mmol/L (ref 135–145)
TOTAL PROTEIN: 7.4 g/dL (ref 6.5–8.1)

## 2016-01-29 LAB — CBC
HCT: 43.4 % (ref 39.0–52.0)
Hemoglobin: 15.3 g/dL (ref 13.0–17.0)
MCH: 27.8 pg (ref 26.0–34.0)
MCHC: 35.3 g/dL (ref 30.0–36.0)
MCV: 78.9 fL (ref 78.0–100.0)
PLATELETS: 356 10*3/uL (ref 150–400)
RBC: 5.5 MIL/uL (ref 4.22–5.81)
RDW: 14.1 % (ref 11.5–15.5)
WBC: 8.7 10*3/uL (ref 4.0–10.5)

## 2016-01-29 LAB — ETHANOL

## 2016-01-29 LAB — SALICYLATE LEVEL: Salicylate Lvl: <4 mg/dL (ref 2.8–30.0)

## 2016-01-29 LAB — ACETAMINOPHEN LEVEL

## 2016-01-29 MED ORDER — NICOTINE 21 MG/24HR TD PT24
21.0000 mg | MEDICATED_PATCH | Freq: Every day | TRANSDERMAL | Status: DC
  Administered 2016-01-29: 21 mg via TRANSDERMAL
  Filled 2016-01-29 (×2): qty 1

## 2016-01-29 MED ORDER — TRAZODONE HCL 50 MG PO TABS
50.0000 mg | ORAL_TABLET | Freq: Every evening | ORAL | Status: DC | PRN
  Filled 2016-01-29: qty 1

## 2016-01-29 MED ORDER — LORAZEPAM 1 MG PO TABS
1.0000 mg | ORAL_TABLET | Freq: Once | ORAL | Status: AC
  Administered 2016-01-29: 1 mg via ORAL
  Filled 2016-01-29: qty 1

## 2016-01-29 MED ORDER — CITALOPRAM HYDROBROMIDE 10 MG PO TABS
30.0000 mg | ORAL_TABLET | Freq: Every day | ORAL | Status: DC
  Administered 2016-01-30: 30 mg via ORAL
  Filled 2016-01-29: qty 3

## 2016-01-29 MED ORDER — LAMOTRIGINE 25 MG PO TABS
25.0000 mg | ORAL_TABLET | Freq: Every day | ORAL | Status: DC
  Administered 2016-01-30: 25 mg via ORAL
  Filled 2016-01-29: qty 1

## 2016-01-29 MED ORDER — NICOTINE 14 MG/24HR TD PT24: 14.0000 mg | MEDICATED_PATCH | Freq: Once | TRANSDERMAL | Status: DC

## 2016-01-29 NOTE — ED Notes (Signed)
Pt given paper scrubs and security called to wand patient.

## 2016-01-29 NOTE — ED Notes (Signed)
Per pt his Rx meds did not accompany pt to our facility, per Officer Self, the pt is to pick up the medications from the [facility]'s office, pt is updated by Officer Self re: medication pickup

## 2016-01-29 NOTE — BH Assessment (Addendum)
Tele Assessment Note   Dalton [name] is an 30 y.o. male. The Pt denies SI/HI and AVH. Pt is IVCd. Pt states his mother IVCd him because he was intoxicated and she was afraid he was begin to use drugs again and spiral out-of-control. Pt admits to numerous inpatient stays for depression and polysubstance abuse. Pt was last hospitalized at [facility] in October 2016. Pt is receiving outpatient therapy from [facility]. Pt is prescribed Lamictal and Celexa. Pt admits to not having his medication the past couple of weeks.  Pt states he did not have the money to purchase his prescription. Pt states that his medications have been effective, but his depression worsens when he does not take them. Pt states he has been depressed about wasting his life when he was drugs but he did not want to harm himself. Pt reports alcohol use but denies any other drug use. Pt denies abuse.  Collateral information Kathie Rhodes Lea-Pt's mother)- Per Mrs. Lea the Pt was missing yesterday and was found intoxicated behind a dumpster. Pt began to act strangely when he was brought home. According to the Mrs. Lea, the Pt was hitting his head with his hands, appearing hopeless, making the following statements: "I'm not worth anything," "please just beat me up." "I'm worthless." Mrs. Clint Lipps stats that Pt seem under the influence of unknown substances. Mrs. Clint Lipps reports the Pt had his medication but was not taking it. Mrs. Clint Lipps is concerned that the Pt is a danger to himself.  Writer consulted with May, NP. Per May, NP Pt will be re-assessed in the am.   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 Allergy   . Asthma   . Pulmonary tuberculosis     Past Surgical History  Procedure Laterality Date  . Tonsillectomy      Family History: History reviewed. No pertinent family history.  Social History:  reports that he has been smoking Cigarettes.  He has been smoking about  0.50 packs per day. He has never used smokeless tobacco. He reports that he does not drink alcohol or use illicit drugs.  Additional Social History:  Alcohol / Drug Use Pain Medications: Pt denies Prescriptions: Celexa, Lamictal Over the Counter: Pt denies History of alcohol / drug use?: Yes Longest period of sobriety (when/how long): uknown Substance #1 Name of Substance 1: alcohol 1 - Age of First Use: unknown 1 - Amount (size/oz): unknown 1 - Frequency: unknown 1 - Duration: unknown 1 - Last Use / Amount: unknown Substance #2 Name of Substance 2: prescription pills 2 - Age of First Use: unknown 2 - Amount (size/oz): unknown 2 - Frequency: unknown 2 - Duration: unknown 2 - Last Use / Amount: unknown  CIWA: CIWA-Ar BP: 130/87 mmHg Pulse Rate: 88 COWS:    PATIENT STRENGTHS: (choose at least two) Average or above average intelligence Capable of independent living  Allergies:  Allergies  Allergen Reactions  . Cephalexin     Pt states he is not allergic to med    Home Medications:  (Not in a hospital admission)  OB/GYN Status:  No LMP for male patient.  General Assessment Data Location of Assessment: [facility] ED TTS Assessment: In system Is this a Tele or Face-to-Face Assessment?: Tele Assessment Is this an Initial Assessment or a Re-assessment for this encounter?: Initial Assessment Marital status: Single Maiden name: NA Is patient pregnant?: No Pregnancy Status: No Living Arrangements: Spouse/significant other Can  pt return to current living arrangement?: Yes Admission Status: Involuntary Is patient capable of signing voluntary admission?: Yes Referral Source: Self/Family/Friend Insurance type: SP     Crisis Care Plan Living Arrangements: Spouse/significant other Legal Guardian: Other: (self) Name of Psychiatrist: NA Name of Therapist: NA  Education Status Is patient currently in school?: No Current Grade: NA Highest grade of school patient has  completed: [employer] Name of school: NA Contact person: NA  Risk to self with the past 6 months Suicidal Ideation: No Has patient been a risk to self within the past 6 months prior to admission? : No Suicidal Intent: No Has patient had any suicidal intent within the past 6 months prior to admission? : No Is patient at risk for suicide?: No Suicidal Plan?: No Has patient had any suicidal plan within the past 6 months prior to admission? : No Access to Means: No What has been your use of drugs/alcohol within the last 12 months?: alcohol, previous illegal drug use Previous Attempts/Gestures: No How many times?: 0 Other Self Harm Risks: NA Triggers for Past Attempts: Other (Comment) (SA) Intentional Self Injurious Behavior: None Family Suicide History: No Recent stressful life event(s): Other (Comment) (alcohol abuse) Persecutory voices/beliefs?: No Depression: No Depression Symptoms:  (Pt denies) Substance abuse history and/or treatment for substance abuse?: Yes Suicide prevention information given to non-admitted patients: Not applicable  Risk to Others within the past 6 months Homicidal Ideation: No Does patient have any lifetime risk of violence toward others beyond the six months prior to admission? : No Thoughts of Harm to Others: No Current Homicidal Intent: No Current Homicidal Plan: No Access to Homicidal Means: No Identified Victim: NA History of harm to others?: No Assessment of Violence: None Noted Violent Behavior Description: NA Does patient have access to weapons?: No Criminal Charges Pending?: No Does patient have a court date: No Is patient on probation?: No  Psychosis Hallucinations: None noted Delusions: None noted  Mental Status Report Appearance/Hygiene: Unremarkable Eye Contact: Fair Motor Activity: Freedom of movement Speech: Logical/coherent Level of Consciousness: Alert Mood: Euthymic Affect: Appropriate to circumstance Anxiety Level:  Minimal Thought Processes: Coherent, Relevant Judgement: Unimpaired Orientation: Person, Place, Time, Situation, Appropriate for developmental age Obsessive Compulsive Thoughts/Behaviors: None  Cognitive Functioning Concentration: Normal Memory: Remote Intact, Recent Intact IQ: Average Insight: Poor Impulse Control: Poor Appetite: Good Weight Loss: 0 Weight Gain: 0 Sleep: No Change Total Hours of Sleep: 8 Vegetative Symptoms: None  ADLScreening [facility] Assessment Services) Patient's cognitive ability adequate to safely complete daily activities?: Yes Patient able to express need for assistance with ADLs?: Yes Independently performs ADLs?: Yes (appropriate for developmental age)  Prior Inpatient Therapy Prior Inpatient Therapy: Yes Prior Therapy Dates: 2016 Prior Therapy Facilty/Provider(s): [facility] Reason for Treatment: depression, and SA  Prior Outpatient Therapy Prior Outpatient Therapy: Yes Prior Therapy Dates: 2017 Prior Therapy Facilty/Provider(s): [facility] Reason for Treatment: depression Does patient have an ACCT team?: No Does patient have Intensive In-House Services?  : No Does patient have [facility] services? : No Does patient have P4CC services?: No  ADL Screening (condition at time of admission) Patient's cognitive ability adequate to safely complete daily activities?: Yes Is the patient deaf or have difficulty hearing?: No Does the patient have difficulty seeing, even when wearing glasses/contacts?: No Does the patient have difficulty concentrating, remembering, or making decisions?: No Patient able to express need for assistance with ADLs?: Yes Does the patient have difficulty dressing or bathing?: No Independently performs ADLs?: Yes (appropriate for developmental age) Does the  patient have difficulty walking or climbing stairs?: No Weakness of Legs: None Weakness of Arms/Hands: None       Abuse/Neglect Assessment (Assessment to be complete while patient is  alone) Physical Abuse: Denies Verbal Abuse: Denies Sexual Abuse: Denies Exploitation of patient/patient's resources: Denies Self-Neglect: Denies Values / Beliefs Cultural Requests During Hospitalization: None   Advance Directives (For Healthcare) Does patient have an advance directive?: No Would patient like information on creating an advanced directive?: No - patient declined information    Additional Information 1:1 In Past 12 Months?: No CIRT Risk: No Elopement Risk: No Does patient have medical clearance?: Yes     Disposition:  Disposition Initial Assessment Completed for this Encounter: Yes Type of inpatient treatment program: Adult  Evelean Bigler D 01/29/2016 3:59 PM

## 2016-01-29 NOTE — ED Notes (Signed)
Pt brought in by [facility] office with IVC paperwork that was taken out by his mother. States that he doesn't believe he should be here, and remembers everything that happened today. Reports he has been drinking more lately but has recently been started back on his depression medication. Denies any SI/HI.

## 2016-01-29 NOTE — ED Provider Notes (Signed)
CSN: [id number]     Arrival date & time 01/29/16  1259 History   First MD Initiated Contact with Patient 01/29/16 1434     Chief Complaint  Patient presents with  . IVC    IVC     (Consider location/radiation/quality/duration/timing/severity/associated sxs/prior Treatment) HPI 30 year old male with a history of bipolar and addiction presents with concern for behavior is dangerous to himself with IVC placed by his mother.  Patient and mother report he has been off of his Lamictal and celexa for 2 weeks until yesterday.  Mom reports that yesterday was the worst she has ever seen him, reports they could not find him, and when they did they found him passed out on a piece of cardboard by the dumpster. He also began to act strangely when he was home, hitting his head in his hands, and asking his father to "just be beat me up", "please bash my head in."  Pt currently denies any SI/HI/hallucinations. Reports he has had multiple inpatient admissions for depression and polysubstance abuse.  Reports he is feeling worse since he was off his medications, and now that he's been on his medications for 24 hours, he is feeling improved, and does not want to go behavior holding because he will risk losing his job.  Mother reports that she is just learned the patient has already lost his job, due to leaving work, not telling the truth. Patient not aware of this at this time.  Past Medical History  Diagnosis Date  . Allergy   . Asthma   . Pulmonary tuberculosis    Past Surgical History  Procedure Laterality Date  . Tonsillectomy     History reviewed. No pertinent family history. Social History  Substance Use Topics  . Smoking status: Current Every Day Smoker -- 0.50 packs/day    Types: Cigarettes  . Smokeless tobacco: Never Used     Comment: 4 ciggs qd  . Alcohol Use: No    Review of Systems  Constitutional: Negative for fever.  HENT: Negative for sore throat.   Eyes: Negative for visual  disturbance.  Respiratory: Negative for shortness of breath.   Cardiovascular: Negative for chest pain.  Gastrointestinal: Negative for abdominal pain.  Genitourinary: Negative for difficulty urinating.  Musculoskeletal: Negative for back pain and neck stiffness.  Skin: Negative for rash.  Neurological: Negative for syncope and headaches.  Psychiatric/Behavioral: Negative for suicidal ideas, hallucinations and sleep disturbance.      Allergies  Cephalexin  Home Medications   Prior to Admission medications   Medication Sig Start Date End Date Taking? Authorizing Provider  albuterol (PROVENTIL HFA;VENTOLIN HFA) 108 (90 BASE) MCG/ACT inhaler Inhale 2 puffs into the lungs every 4 (four) hours as needed for wheezing or shortness of breath. 09/09/15  Yes Sanjuana Kava, NP  citalopram (CELEXA) 10 MG tablet Take 3 tablets (30 mg total) by mouth daily. For depression 09/09/15  Yes Sanjuana Kava, NP  lamoTRIgine (LAMICTAL) 25 MG tablet Take 1 tablet (25 mg total) by mouth daily. For mood stabilization 09/09/15  Yes Sanjuana Kava, NP  nicotine (NICODERM CQ - DOSED IN MG/24 HOURS) 21 mg/24hr patch Place 1 patch (21 mg total) onto the skin daily. For smoking cessation 09/09/15  Yes Sanjuana Kava, NP  ARIPiprazole (ABILIFY) 5 MG tablet Take 1 tablet (5 mg total) by mouth daily. For mood control 09/09/15   Sanjuana Kava, NP  hydrOXYzine (ATARAX/VISTARIL) 50 MG tablet Take 1 tablet (50 mg total) by mouth every  6 (six) hours as needed for anxiety. 09/09/15   Sanjuana Kava, NP  traZODone (DESYREL) 50 MG tablet Take 1 tablet (50 mg total) by mouth at bedtime and may repeat dose one time if needed. For sleep 09/09/15   Sanjuana Kava, NP   BP 130/87 mmHg  Pulse 88  Temp(Src) 98.9 F (37.2 C) (Oral)  Resp 18  SpO2 98% Physical Exam  Constitutional: He is oriented to person, place, and time. He appears well-developed and well-nourished. No distress.  HENT:  Head: Normocephalic and atraumatic.  Eyes:  Conjunctivae and EOM are normal.  Neck: Normal range of motion.  Cardiovascular: Normal rate, regular rhythm, normal heart sounds and intact distal pulses.  Exam reveals no gallop and no friction rub.   No murmur heard. Pulmonary/Chest: Effort normal and breath sounds normal. No respiratory distress. He has no wheezes. He has no rales.  Abdominal: Soft. He exhibits no distension. There is no tenderness. There is no guarding.  Musculoskeletal: He exhibits no edema.  Neurological: He is alert and oriented to person, place, and time.  Skin: Skin is warm and dry. He is not diaphoretic.  Psychiatric: He expresses no homicidal and no suicidal ideation. He expresses no suicidal plans and no homicidal plans.  Nursing note and vitals reviewed.   ED Course  Procedures (including critical care time) Labs Review Labs Reviewed  COMPREHENSIVE METABOLIC PANEL - Abnormal; Notable for the following:    Chloride 99 (*)    Calcium 10.4 (*)    All other components within normal limits  ACETAMINOPHEN LEVEL - Abnormal; Notable for the following:    Acetaminophen (Tylenol), Serum <10 (*)    All other components within normal limits  ETHANOL  SALICYLATE LEVEL  CBC  URINE RAPID DRUG SCREEN, HOSP PERFORMED    Imaging Review No results found. I have personally reviewed and evaluated these images and lab results as part of my medical decision-making.   EKG Interpretation None      MDM   Final diagnoses:  Severe recurrent major depressive disorder with psychotic features (HCC)   30 year old male with a history of bipolar and addiction presents with concern for behavior is dangerous to himself with IVC placed by his mother.  Patient and mother report he has been off of his Lamictal and celexa for 2 weeks until yesterday.  Mom reports that yesterday was the worst she has ever seen him, reports they could not find him, and when they did they found him passed out on a piece of cardboard by the dumpster.  He also began to act strangely when he was home, hitting his head in his hands, and asking his father to "just be beat me up", "please bash my head in."  Patient denies any suicidal or homicidal ideation my examination, however given concerns expressed by mother the patient is a danger to himself and IVC place, consult TTS.   TTS plans to reevaluate the patient in the morning. Ordered his home medications. IVC still in place by mother.  Alvira Monday, MD 01/29/16 1757

## 2016-01-30 DIAGNOSIS — F1994 Other psychoactive substance use, unspecified with psychoactive substance-induced mood disorder: Secondary | ICD-10-CM | POA: Diagnosis present

## 2016-01-30 NOTE — ED Notes (Signed)
telepsych monitor set up at bedside

## 2016-01-30 NOTE — Consult Note (Signed)
Telepsych Consultation   Reason for Consult:  IVC'ed by mother intoxication Referring Physician:  EDP Patient Identification: Dalton [name] MRN:  [id number] Principal Diagnosis: Substance induced mood disorder [facility]) Diagnosis:   Patient Active Problem List   Diagnosis Date Noted  . Substance induced mood disorder ([place]) [F19.94] 01/30/2016  . Severe recurrent major depressive disorder with psychotic features ([place]) [F33.3]   . Substance or medication-induced depressive disorder with onset during withdrawal ([place]) [F19.94] 02/14/2015  . Alcohol use disorder, severe, dependence ([place]) [F10.20] 02/13/2015  . Alcohol withdrawal syndrome without complication ([place]) [Z12.458] 02/13/2015  . Opioid use disorder, severe, dependence ([place]) [F11.20] 02/13/2015  . Opioid withdrawal ([place]) [F11.23] 02/13/2015  . Opiate dependence, continuous ([place]) [F11.20] 10/13/2014  . 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 Eyelid abnormality [H02.9] 03/12/2012  . DIABETES MELLITUS, TYPE II [E11.9] 11/10/2010  . ABSCESS, GLUTEAL [IMO0002] 10/20/2010  . CERVICAL LYMPHADENOPATHY [R59.9] 05/16/2010  . 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 VIRAL URI [J06.9] 10/02/2008  . PLEURAL EFFUSION, LEFT [J90] 03/22/2008  . ALLERGIC RHINITIS [J30.9] 02/01/2008  . ASTHMA [J45.909] 02/01/2008    Total Time spent with patient: 30 minutes  Subjective:   Dalton [name] is a 30 y.o. male patient present to [facility] under IVC by his mother because he was intoxicated and she was afraid that he would hurt himself.    HPI:  Mang Dalton [name] 30 yr old black male presented to [facility] under IVC by his mother.  Patient states that his mother IVC'ed because she felt that he was starting to act out again.  States that he has history of alcohol abuse when he is depressed or not taking his medication.  State that his alcohol consumption has increased in the last week related to he has stopped taking his medication because he was feeling  better.  States that he is now aware that he will need to continue his medications and that it will be a long process for him.  States that his mother is his primary support.  He goes to [place] for outpatient services once a month.  At this time patient denies suicidal ideation, homicidal ideation, psychosis, and paranoia.  Patient is alert and oriented x4; he is aware of the medications that he is taking.  Recently started a new job 3 weeks ago and doesn't want to miss work.  States that he understands now that he is one of those persons that can't just drink alcohol casually.    Past Psychiatric History: Major depression, Substance induce mood disorder, and alcohol abuse.    Risk to Self: Suicidal Ideation: No Suicidal Intent: No Is patient at risk for suicide?: No Suicidal Plan?: No Access to Means: No What has been your use of drugs/alcohol within the last 12 months?: alcohol, previous illegal drug use How many times?: 0 Other Self Harm Risks: NA Triggers for Past Attempts: Other (Comment) (SA) Intentional Self Injurious Behavior: None Risk to Others: Homicidal Ideation: No Thoughts of Harm to Others: No Current Homicidal Intent: No Current Homicidal Plan: No Access to Homicidal Means: No Identified Victim: NA History of harm to others?: No Assessment of Violence: None Noted Violent Behavior Description: NA Does patient have access to weapons?: No Criminal Charges Pending?: No Does patient have a court date: No Prior Inpatient Therapy: Prior Inpatient Therapy: Yes Prior Therapy Dates: 2016 Prior Therapy Facilty/Provider(s): [facility] Reason for Treatment: depression, and SA Prior Outpatient Therapy: Prior Outpatient Therapy: Yes Prior Therapy Dates: 2017 Prior Therapy Facilty/Provider(s): [facility]  Reason for Treatment: depression Does patient have an ACCT team?: No Does patient have Intensive In-House Services?  : No Does patient have [facility] services? : No Does patient have P4[facility]  services?: No  Past Medical History:  Past Medical History  Diagnosis Date  . Allergy   . Asthma   . Pulmonary tuberculosis     Past Surgical History  Procedure Laterality Date  . Tonsillectomy     Family History: History reviewed. No pertinent family history. Family Psychiatric  History: Denies family psych history Social History:  History  Alcohol Use No     History  Drug Use No    Comment: former substance abuse    Social History   Social History  . Marital Status: Single    Spouse Name: N/A  . Number of Children: N/A  . Years of Education: N/A   Social History Main Topics  . Smoking status: Current Every Day Smoker -- 0.50 packs/day    Types: Cigarettes  . Smokeless tobacco: Never Used     Comment: 4 ciggs qd  . Alcohol Use: No  . Drug Use: No     Comment: former substance abuse  . Sexual Activity: Not Asked   Other Topics Concern  . None   Social History Narrative   Additional Social History:    Allergies:   Allergies  Allergen Reactions  . Cephalexin     Pt states he is not allergic to med    Labs:  Results for orders placed or performed during the hospital encounter of 01/29/16 (from the past 48 hour(s))  Comprehensive metabolic panel     Status: Abnormal   Collection Time: 01/29/16  1:56 PM  Result Value Ref Range   Sodium 141 135 - 145 mmol/L   Potassium 4.4 3.5 - 5.1 mmol/L   Chloride 99 (L) 101 - 111 mmol/L   CO2 28 22 - 32 mmol/L   Glucose, Bld 97 65 - 99 mg/dL   BUN 10 6 - 20 mg/dL   Creatinine, Ser 0.92 0.61 - 1.24 mg/dL   Calcium 10.4 (H) 8.9 - 10.3 mg/dL   Total Protein 7.4 6.5 - 8.1 g/dL   Albumin 4.5 3.5 - 5.0 g/dL   AST 27 15 - 41 U/L   ALT 23 17 - 63 U/L   Alkaline Phosphatase 64 38 - 126 U/L   Total Bilirubin 0.6 0.3 - 1.2 mg/dL   GFR calc non Af Amer >60 >60 mL/min   GFR calc Af Amer >60 >60 mL/min    Comment: (NOTE) The eGFR has been calculated using the CKD EPI equation. This calculation has not been validated in  all clinical situations. eGFR's persistently <60 mL/min signify possible Chronic Kidney Disease.    Anion gap 14 5 - 15  Ethanol (ETOH)     Status: None   Collection Time: 01/29/16  1:56 PM  Result Value Ref Range   Alcohol, Ethyl (B) <5 <5 mg/dL    Comment:        LOWEST DETECTABLE LIMIT FOR SERUM ALCOHOL IS 5 mg/dL FOR MEDICAL PURPOSES ONLY   Salicylate level     Status: None   Collection Time: 01/29/16  1:56 PM  Result Value Ref Range   Salicylate Lvl <3.6 2.8 - 30.0 mg/dL  Acetaminophen level     Status: Abnormal   Collection Time: 01/29/16  1:56 PM  Result Value Ref Range   Acetaminophen (Tylenol), Serum <10 (L) 10 - 30 ug/mL    Comment:  THERAPEUTIC CONCENTRATIONS VARY SIGNIFICANTLY. A RANGE OF 10-30 ug/mL MAY BE AN EFFECTIVE CONCENTRATION FOR MANY PATIENTS. HOWEVER, SOME ARE BEST TREATED AT CONCENTRATIONS OUTSIDE THIS RANGE. ACETAMINOPHEN CONCENTRATIONS >150 ug/mL AT 4 HOURS AFTER INGESTION AND >50 ug/mL AT 12 HOURS AFTER INGESTION ARE OFTEN ASSOCIATED WITH TOXIC REACTIONS.   CBC     Status: None   Collection Time: 01/29/16  1:56 PM  Result Value Ref Range   WBC 8.7 4.0 - 10.5 K/uL   RBC 5.50 4.22 - 5.81 MIL/uL   Hemoglobin 15.3 13.0 - 17.0 g/dL   HCT 43.4 39.0 - 52.0 %   MCV 78.9 78.0 - 100.0 fL   MCH 27.8 26.0 - 34.0 pg   MCHC 35.3 30.0 - 36.0 g/dL   RDW 14.1 11.5 - 15.5 %   Platelets 356 150 - 400 K/uL  Urine rapid drug screen (hosp performed) (Not at [facility])     Status: Abnormal   Collection Time: 01/29/16  8:45 PM  Result Value Ref Range   Opiates NONE DETECTED NONE DETECTED   Cocaine POSITIVE (A) NONE DETECTED   Benzodiazepines POSITIVE (A) NONE DETECTED   Amphetamines NONE DETECTED NONE DETECTED   Tetrahydrocannabinol NONE DETECTED NONE DETECTED   Barbiturates NONE DETECTED NONE DETECTED    Comment:        DRUG SCREEN FOR MEDICAL PURPOSES ONLY.  IF CONFIRMATION IS NEEDED FOR ANY PURPOSE, NOTIFY LAB WITHIN 5 DAYS.        LOWEST  DETECTABLE LIMITS FOR URINE DRUG SCREEN Drug Class       Cutoff (ng/mL) Amphetamine      1000 Barbiturate      200 Benzodiazepine   314 Tricyclics       970 Opiates          300 Cocaine          300 THC              50     Current Facility-Administered Medications  Medication Dose Route Frequency Provider Last Rate Last Dose  . citalopram (CELEXA) tablet 30 mg  30 mg Oral Daily Gareth Morgan, MD   30 mg at 01/30/16 0929  . lamoTRIgine (LAMICTAL) tablet 25 mg  25 mg Oral Daily Gareth Morgan, MD   25 mg at 01/30/16 0929  . nicotine (NICODERM CQ - dosed in mg/24 hours) patch 21 mg  21 mg Transdermal Daily Gareth Morgan, MD   21 mg at 01/29/16 1759  . traZODone (DESYREL) tablet 50 mg  50 mg Oral QHS,MR X 1 Gareth Morgan, MD   50 mg at 01/29/16 2210   Current Outpatient Prescriptions  Medication Sig Dispense Refill  . albuterol (PROVENTIL HFA;VENTOLIN HFA) 108 (90 BASE) MCG/ACT inhaler Inhale 2 puffs into the lungs every 4 (four) hours as needed for wheezing or shortness of breath.    . citalopram (CELEXA) 10 MG tablet Take 3 tablets (30 mg total) by mouth daily. For depression 30 tablet 0  . lamoTRIgine (LAMICTAL) 25 MG tablet Take 1 tablet (25 mg total) by mouth daily. For mood stabilization 30 tablet 0  . nicotine (NICODERM CQ - DOSED IN MG/24 HOURS) 21 mg/24hr patch Place 1 patch (21 mg total) onto the skin daily. For smoking cessation 28 patch 0  . ARIPiprazole (ABILIFY) 5 MG tablet Take 1 tablet (5 mg total) by mouth daily. For mood control 30 tablet 0  . hydrOXYzine (ATARAX/VISTARIL) 50 MG tablet Take 1 tablet (50 mg total) by mouth every 6 (six) hours  as needed for anxiety. 45 tablet 0  . traZODone (DESYREL) 50 MG tablet Take 1 tablet (50 mg total) by mouth at bedtime and may repeat dose one time if needed. For sleep 60 tablet 0    Musculoskeletal: Strength & Muscle Tone: within normal limits Gait & Station: Did not see patient ambulate Patient leans: N/A  Psychiatric  Specialty Exam: Review of Systems  Psychiatric/Behavioral: Positive for substance abuse. Depression: Denies. Suicidal ideas: Denies. Hallucinations: Denies. Nervous/anxious: Denies. Insomnia: Denies.     Blood pressure 111/94, pulse 95, temperature 98.6 F (37 C), temperature source Oral, resp. rate 16, SpO2 97 %.There is no weight on file to calculate BMI.  General Appearance: Casual  Eye Contact::  Good  Speech:  Clear and Coherent and Normal Rate  Volume:  Normal  Mood:  "Good"  Affect:  Appropriate  Thought Process:  Circumstantial and Goal Directed  Orientation:  Full (Time, Place, and Person)  Thought Content:  Denies hallucinations, delusions, and paranoia  Suicidal Thoughts:  No  Homicidal Thoughts:  No  Memory:  Immediate;   Good Recent;   Good Remote;   Good  Judgement:  Intact  Insight:  Present  Psychomotor Activity:  Normal  Concentration:  Fair  Recall:  Good  Fund of Knowledge:Good  Language: Good  Akathisia:  No  Handed:  Right  AIMS (if indicated):     Assets:  Communication Skills Desire for [place] Transportation  ADL's:  Intact  Cognition: WNL  Sleep:      Treatment Plan Summary: Plan  EDP can discharge home once medically cleared.  Patient to follow up with [facility] and to continue current medications.   Disposition: No evidence of imminent risk to self or others at present.   Patient does not meet criteria for psychiatric inpatient admission. Discussed crisis plan, support from social network, calling 911, coming to the Emergency Department, and calling Suicide Hotline.  Earleen Newport, NP 01/30/2016 4:43 PM

## 2016-01-30 NOTE — ED Notes (Signed)
Patient was given a snack and drink, Diet ordered for lunch.

## 2016-01-30 NOTE — ED Notes (Signed)
Pt given belongings back. No further questions/concerns regarding belongings.

## 2016-01-30 NOTE — ED Notes (Signed)
Pt verbalized understanding of d/c instructions and follow-up care. No further questions/concerns, VSS, ambulatory w/ steady gait (refused wheelchair) 

## 2016-01-30 NOTE — Discharge Instructions (Signed)
Neuropsychological Examination A neuropsychological examination is a series of tests related to thinking and behavior. It is done to see how the brain works. The exam may be done on people who have had a brain injury or trauma or who have a disease or disorder that affects the brain. The results will help your health care provider diagnose any problems and put together a helpful treatment or assistance plan. LET [facility] CARE PROVIDER KNOW ABOUT:   Any allergies you have.  Your sports and activities.  Recent injuries or surgery.  All medicines you are taking, including vitamins, herbs, eye drops, creams, and over-the-counter medicines.  Medical conditions you have.  Any other issues or behaviors that might affect your ability to take the exam. BEFORE THE PROCEDURE   Ask your health care provider about how medicines you are taking may affect the exam results.  Prepare a list of current medicines to bring to the exam.  Ask a family member or friend to come to the test if you have trouble remembering family history or any other details.  Prepare any records of previous neurological testing, including CT or MRI scans, to bring to the exam.  Get a good night's rest before the exam.  Do not drink alcohol 24 hours before the exam.  Let the examiner know about any other issues or behaviors that might affect your ability to take the exam. THE PROCEDURE The test is usually performed at an outpatient clinic. You will sit at a table for most of the exam. It can also be done bedside in a hospital setting, if necessary. During the exam, you will answer questions, make choices, read, write, and complete simple physical tasks. You might also use a computer-guided lesson. There is no pain, needles, or electrodes. A trained examiner will be there.  There are several areas, or modules, to the exam. The examiner will administer the ones that most closely relate to the reason for your referral and  the disease or symptoms. The examination may take several hours depending on the areas being tested. AFTER THE PROCEDURE   You may go home after the exam.  Follow up with your health care provider for the results.   This information is not intended to replace advice given to you by your health care provider. Make sure you discuss any questions you have with your health care provider.   Document Released: 06/20/2014 Document Reviewed: 06/20/2014 Else[employer] Interactive Patient Education [employer]

## 2016-02-14 ENCOUNTER — Emergency Department (HOSPITAL_COMMUNITY)
Admission: EM | Admit: 2016-02-14 | Discharge: 2016-02-14 | Disposition: A | Discharge status: Home / Self Care | Payer: Self-pay | Attending: Emergency Medicine | Admitting: Emergency Medicine

## 2016-02-14 ENCOUNTER — Inpatient Hospital Stay (HOSPITAL_COMMUNITY)
Admission: AD | Admit: 2016-02-14 | Payer: Federal, State, Local not specified - Other | Source: Intra-hospital | Admitting: Psychiatry

## 2016-02-14 ENCOUNTER — Encounter (HOSPITAL_COMMUNITY): Payer: Self-pay | Admitting: *Deleted

## 2016-02-14 DIAGNOSIS — F319 Bipolar disorder, unspecified: Secondary | ICD-10-CM

## 2016-02-14 DIAGNOSIS — Z8611 Personal history of tuberculosis: Secondary | ICD-10-CM | POA: Insufficient documentation

## 2016-02-14 DIAGNOSIS — F1024 Alcohol dependence with alcohol-induced mood disorder: Secondary | ICD-10-CM | POA: Insufficient documentation

## 2016-02-14 DIAGNOSIS — Z79899 Other long term (current) drug therapy: Secondary | ICD-10-CM | POA: Insufficient documentation

## 2016-02-14 DIAGNOSIS — F919 Conduct disorder, unspecified: Secondary | ICD-10-CM | POA: Insufficient documentation

## 2016-02-14 DIAGNOSIS — F1094 Alcohol use, unspecified with alcohol-induced mood disorder: Secondary | ICD-10-CM

## 2016-02-14 DIAGNOSIS — J45909 Unspecified asthma, uncomplicated: Secondary | ICD-10-CM | POA: Insufficient documentation

## 2016-02-14 DIAGNOSIS — F17210 Nicotine dependence, cigarettes, uncomplicated: Secondary | ICD-10-CM | POA: Insufficient documentation

## 2016-02-14 DIAGNOSIS — F3130 Bipolar disorder, current episode depressed, mild or moderate severity, unspecified: Secondary | ICD-10-CM | POA: Insufficient documentation

## 2016-02-14 DIAGNOSIS — F1020 Alcohol dependence, uncomplicated: Secondary | ICD-10-CM

## 2016-02-14 HISTORY — DX: Bipolar disorder, unspecified: F31.9

## 2016-02-14 LAB — COMPREHENSIVE METABOLIC PANEL
ALBUMIN: 4.9 g/dL (ref 3.5–5.0)
ALT: 19 U/L (ref 17–63)
AST: 33 U/L (ref 15–41)
Alkaline Phosphatase: 67 U/L (ref 38–126)
Anion gap: 13 (ref 5–15)
BUN: 13 mg/dL (ref 6–20)
CHLORIDE: 102 mmol/L (ref 101–111)
CO2: 26 mmol/L (ref 22–32)
CREATININE: 0.94 mg/dL (ref 0.61–1.24)
Calcium: 9.7 mg/dL (ref 8.9–10.3)
GFR calc Af Amer: >60 mL/min (ref >60)
Glucose, Bld: 87 mg/dL (ref 65–99)
POTASSIUM: 4.1 mmol/L (ref 3.5–5.1)
SODIUM: 141 mmol/L (ref 135–145)
Total Bilirubin: 0.5 mg/dL (ref 0.3–1.2)
Total Protein: 8.2 g/dL — ABNORMAL HIGH (ref 6.5–8.1)

## 2016-02-14 LAB — RAPID URINE DRUG SCREEN, HOSP PERFORMED
AMPHETAMINES: NOT DETECTED
BENZODIAZEPINES: NOT DETECTED
Barbiturates: NOT DETECTED
Cocaine: NOT DETECTED
OPIATES: NOT DETECTED
TETRAHYDROCANNABINOL: NOT DETECTED

## 2016-02-14 LAB — CBC
HCT: 43.3 % (ref 39.0–52.0)
HEMOGLOBIN: 15.9 g/dL (ref 13.0–17.0)
MCH: 27.9 pg (ref 26.0–34.0)
MCHC: 36.7 g/dL — ABNORMAL HIGH (ref 30.0–36.0)
MCV: 76.1 fL — AB (ref 78.0–100.0)
PLATELETS: 332 10*3/uL (ref 150–400)
RBC: 5.69 MIL/uL (ref 4.22–5.81)
RDW: 13.6 % (ref 11.5–15.5)
WBC: 13.4 10*3/uL — AB (ref 4.0–10.5)

## 2016-02-14 LAB — ETHANOL: ALCOHOL ETHYL (B): 270 mg/dL — AB (ref <5)

## 2016-02-14 LAB — ACETAMINOPHEN LEVEL: Acetaminophen (Tylenol), Serum: <10 ug/mL — ABNORMAL LOW (ref 10–30)

## 2016-02-14 LAB — SALICYLATE LEVEL

## 2016-02-14 MED ORDER — LORAZEPAM 1 MG PO TABS
1.0000 mg | ORAL_TABLET | Freq: Every day | ORAL | Status: DC
Start: 2016-02-18 — End: 2016-02-14

## 2016-02-14 MED ORDER — LORAZEPAM 1 MG PO TABS: 1.0000 mg | ORAL_TABLET | Freq: Three times a day (TID) | ORAL | Status: DC

## 2016-02-14 MED ORDER — CITALOPRAM HYDROBROMIDE 20 MG PO TABS
30.0000 mg | ORAL_TABLET | Freq: Every day | ORAL | Status: DC
  Administered 2016-02-14: 30 mg via ORAL
  Filled 2016-02-14: qty 1

## 2016-02-14 MED ORDER — ONDANSETRON 4 MG PO TBDP: 4.0000 mg | ORAL_TABLET | Freq: Four times a day (QID) | ORAL | Status: DC | PRN

## 2016-02-14 MED ORDER — HYDROXYZINE HCL 25 MG PO TABS: 50.0000 mg | ORAL_TABLET | Freq: Four times a day (QID) | ORAL | Status: DC | PRN

## 2016-02-14 MED ORDER — LORAZEPAM 1 MG PO TABS
1.0000 mg | ORAL_TABLET | Freq: Four times a day (QID) | ORAL | Status: DC
  Filled 2016-02-14: qty 1

## 2016-02-14 MED ORDER — HYDROXYZINE HCL 25 MG PO TABS: 25.0000 mg | ORAL_TABLET | Freq: Four times a day (QID) | ORAL | Status: DC | PRN

## 2016-02-14 MED ORDER — ALBUTEROL SULFATE HFA 108 (90 BASE) MCG/ACT IN AERS: 2.0000 | INHALATION_SPRAY | RESPIRATORY_TRACT | Status: DC | PRN

## 2016-02-14 MED ORDER — LAMOTRIGINE 25 MG PO TABS
25.0000 mg | ORAL_TABLET | Freq: Every day | ORAL | Status: DC
  Administered 2016-02-14: 25 mg via ORAL
  Filled 2016-02-14: qty 1

## 2016-02-14 MED ORDER — LORAZEPAM 1 MG PO TABS: 1.0000 mg | ORAL_TABLET | Freq: Two times a day (BID) | ORAL | Status: DC

## 2016-02-14 MED ORDER — LORAZEPAM 1 MG PO TABS: 1.0000 mg | ORAL_TABLET | Freq: Four times a day (QID) | ORAL | Status: DC | PRN

## 2016-02-14 MED ORDER — VITAMIN B-1 100 MG PO TABS: 100.0000 mg | ORAL_TABLET | Freq: Every day | ORAL | Status: DC

## 2016-02-14 MED ORDER — LOPERAMIDE HCL 2 MG PO CAPS: 2.0000 mg | ORAL_CAPSULE | ORAL | Status: DC | PRN

## 2016-02-14 MED ORDER — ADULT MULTIVITAMIN W/MINERALS CH
1.0000 | ORAL_TABLET | Freq: Every day | ORAL | Status: DC
  Administered 2016-02-14: 1 via ORAL
  Filled 2016-02-14: qty 1

## 2016-02-14 MED ORDER — THIAMINE HCL 100 MG/ML IJ SOLN
100.0000 mg | Freq: Once | INTRAMUSCULAR | Status: AC
  Administered 2016-02-14: 100 mg via INTRAMUSCULAR
  Filled 2016-02-14: qty 2

## 2016-02-14 NOTE — ED Notes (Signed)
Up to the bathroom 
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
## 2016-02-14 NOTE — ED Notes (Signed)
Pt ambulatory w/o difficulty from triage 

## 2016-02-14 NOTE — ED Notes (Signed)
Written dc instructions reviewed with pt. Pt denies si/hi/avh at this time. Pt encouraged to avoid alcohol, continue to take his medications as directed, follow up with OP resources provided and to seek treatment/return for any suicidal thoughts or urges. Pt verbalized understanding.  Pt ambulatory with out difficulty to DC area w/mHt.  Belongings returned after leaving the area.

## 2016-02-14 NOTE — [facility] Counselor (Signed)
Patient recommended for discharge by Jamison Lord, DNP with substance abuse outpatient referrals. Patient states that he has had substance abuse treatment in the past but is not sure that he has a substance abuse problems because he has never experienced withdrawal symptoms. Patient states that he would be interested in community programs and [facility] was recommended if patient continues to have substance abuse problems. Patient states that he has attended treatment at the [facility] in the past and he enjoyed that treatment. Patient provided outpatient resources and denied having any questions or concerns.  Patient states that he has called a family member for a ride home.    Arita Severtson, LCSW Therapeutic Triage Specialist [facility] 02/14/2016 5:59 PM  
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
## 2016-02-14 NOTE — ED Notes (Signed)
Bed: WBH38 Expected date:  Expected time:  Means of arrival:  Comments: Hold for triage 6 

## 2016-02-14 NOTE — ED Notes (Signed)
TTS into see 
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
## 2016-02-14 NOTE — ED Notes (Signed)
Up in the dayroom 

## 2016-02-14 NOTE — BH Assessment (Signed)
Assessment completed. Consulted Alberteen SamFran Hobson, NP who recommended that pt be evaluated by psychiatry in the morning. Informed Antony MaduraKelly Humes, PA-C of the recommendation.

## 2016-02-14 NOTE — [facility] Suicide Risk Assessment (Signed)
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 Alcohol-induced mood disorder (HCC) [F10.94] 02/14/2016    Priority: High  . Alcohol use disorder, severe, dependence (HCC) [F10.20] 02/13/2015    Priority: High  . Substance induced mood disorder (HCC) [F19.94] 01/30/2016  . 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 Alcohol withdrawal syndrome without complication (HCC) [F10.230] 02/13/2015  . Opioid use disorder, severe, dependence (HCC) [F11.20] 02/13/2015  . Opioid withdrawal (HCC) [F11.23] 02/13/2015  . Opiate dependence, continuous (HCC) [F11.20] 10/13/2014  . 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 Eyelid abnormality [H02.9] 03/12/2012  . DIABETES MELLITUS, TYPE II [E11.9] 11/10/2010  . ABSCESS, GLUTEAL [IMO0002] 10/20/2010  . CERVICAL LYMPHADENOPATHY [R59.9] 05/16/2010  . 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 VIRAL URI [J06.9] 10/02/2008  . PLEURAL EFFUSION, LEFT [J90] 03/22/2008  . ALLERGIC RHINITIS [J30.9] 02/01/2008  . ASTHMA [J45.909] 02/01/2008    Total Time spent with patient: 45 minutes  Musculoskeletal: Strength & Muscle Tone: within normal limits Gait & Station: normal Patient leans: N/A  Psychiatric Specialty Exam: Review of Systems  Constitutional: Negative.   HENT: Negative.   Eyes: Negative.   Respiratory: Negative.   Cardiovascular: Negative.   Gastrointestinal: Negative.   Genitourinary: Negative.   Musculoskeletal: Negative.   Skin: Negative.   Neurological: Negative.   Endo/Heme/Allergies: Negative.   Psychiatric/Behavioral: Positive for substance abuse.    Blood pressure 108/58, pulse 94, temperature 97.6 F  (36.4 C), temperature source Oral, resp. rate 16, SpO2 97 %.There is no weight on file to calculate BMI.  General Appearance: Casual  Eye Contact::  Good  Speech:  Normal Rate  Volume:  Normal  Mood:  Euthymic  Affect:  Congruent  Thought Process:  Coherent  Orientation:  Full (Time, Place, and Person)  Thought Content:  WDL  Suicidal Thoughts:  No  Homicidal Thoughts:  No  Memory:  Immediate, good; recent, good; remote, good  Judgement:  Fair  Insight:  Good  Psychomotor Activity:  Normal  Concentration:  Good  Recall:  Good  Fund of Knowledge: Good  Language: Good  Akathisia:  No  Handed:  Right  AIMS (if indicated):     Assets:  Housing Leisure Time Physical Health Resilience Social Support  ADL's:  Intact  Cognition: WNL  Sleep:      Mental Status Per Nursing Assessment::   On Admission:   Alcohol intoxication  Demographic Factors:  Male and Adolescent or young adult  Loss Factors: NA  Historical Factors: NA  Risk Reduction Factors:   Sense of responsibility to family, Employed, Living with another person, especially a relative and Positive social support  Continued Clinical Symptoms:  None  Cognitive Features That Contribute To Risk:  None    Suicide Risk:  Minimal: No identifiable suicidal ideation.  Patients presenting with no risk factors but with morbid ruminations; may be classified as minimal risk based on the severity of the depressive symptoms    Plan Of Care/Follow-up recommendations:  Activity:  as tolerated Diet:  heart healthy diet  LORD, JAMISON, NP 02/14/2016, 4:39 PM

## 2016-02-14 NOTE — BH Assessment (Signed)
[facility] Assessment Progress Note  Per Thedore MinsMojeed Akintayo, MD, this pt requires psychiatric hospitalization at this time.  Anthonette LegatoKenesha, RN, [facility]; pt is not to be transported until after 19:00.  Please call report to [phone] and send original documents along with pt via [place].   Doylene Canninghomas Kinan Safley, MA Triage Specialist [phone]

## 2016-02-14 NOTE — ED Notes (Signed)
Friend into see 

## 2016-03-13 ENCOUNTER — Emergency Department (HOSPITAL_COMMUNITY)
Admission: EM | Admit: 2016-03-13 | Discharge: 2016-03-13 | Disposition: A | Discharge status: Home / Self Care | Payer: Self-pay | Attending: Emergency Medicine | Admitting: Emergency Medicine

## 2016-03-13 ENCOUNTER — Encounter (HOSPITAL_COMMUNITY): Payer: Self-pay | Admitting: Emergency Medicine

## 2016-03-13 DIAGNOSIS — F319 Bipolar disorder, unspecified: Secondary | ICD-10-CM | POA: Insufficient documentation

## 2016-03-13 DIAGNOSIS — Z79899 Other long term (current) drug therapy: Secondary | ICD-10-CM | POA: Insufficient documentation

## 2016-03-13 DIAGNOSIS — J45909 Unspecified asthma, uncomplicated: Secondary | ICD-10-CM | POA: Insufficient documentation

## 2016-03-13 DIAGNOSIS — Z8619 Personal history of other infectious and parasitic diseases: Secondary | ICD-10-CM | POA: Insufficient documentation

## 2016-03-13 DIAGNOSIS — F17210 Nicotine dependence, cigarettes, uncomplicated: Secondary | ICD-10-CM | POA: Insufficient documentation

## 2016-03-13 DIAGNOSIS — M62830 Muscle spasm of back: Secondary | ICD-10-CM | POA: Insufficient documentation

## 2016-03-13 MED ORDER — METHOCARBAMOL 500 MG PO TABS: 500.0000 mg | ORAL_TABLET | Freq: Two times a day (BID) | ORAL | Status: DC

## 2016-03-13 MED ORDER — IBUPROFEN 800 MG PO TABS: 800.0000 mg | ORAL_TABLET | Freq: Three times a day (TID) | ORAL | Status: DC

## 2016-03-13 NOTE — ED Notes (Addendum)
Pt c/o low back pain x 3 days. Pt reports symptoms occur every 2-3 months and thinks its a spasm. Pt denies urinary symptoms or incontinence of bowel. Pain radiates down into buttocks and right leg.

## 2016-03-13 NOTE — Discharge Instructions (Signed)

## 2016-03-13 NOTE — ED Provider Notes (Signed)
CSN: [id number]     Arrival date & time 03/13/16  1043 History  By signing my name below, I, Dalton [name], attest that this documentation has been prepared under the direction and in the presence of [name], PA-C. Electronically Signed: Ronney [name], ED Scribe. 03/13/2016. 11:05 AM.    Chief Complaint  Patient presents with  . Back Pain   The history is provided by the patient. No language interpreter was used.    HPI Comments: Dalton [name] is a 30 y.o. male with a history of bipolar 1 disorder, who presents to the Emergency Department complaining of 10/10, tight lower back pain occasionally radiating down his RLE or LLE, that has been intermittent every 2-3 months, which started again 3 days ago. He states the pain usually resolves on its own but has been more persistent during this episode. Patient states he was playing around and jumping with his niece when his pain seemed to onset. Stepping on his left side seems to exacerbate the pain. Patient has tried two doses of muscle relaxants (given by friend), ibuprofen 800 mg, and stretching; all with no relief. He denies any regular medication use. He denies fever, hematuria, dysuria, bowel or bladder incontinence, or saddle anesthesia. He denies a history of IVDA or cancer. Patient has NKDA.  Past Medical History  Diagnosis Date  . Allergy   . Asthma   . Pulmonary tuberculosis   . Bipolar 1 disorder [facility])    Past Surgical History  Procedure Laterality Date  . Tonsillectomy     No family history on file. Social History  Substance Use Topics  . Smoking status: Current Every Day Smoker -- 0.50 packs/day    Types: Cigarettes  . Smokeless tobacco: Never Used     Comment: 4 ciggs qd  . Alcohol Use: Yes    Review of Systems  Constitutional: Negative for fever.  Gastrointestinal:       Negative for bowel incontinence.  Genitourinary: Negative for dysuria and hematuria.       Negative for bladder incontinence.  Musculoskeletal:  Positive for back pain.  Neurological: Negative for numbness.      Allergies  Review of patient's allergies indicates no known allergies.  Home Medications   Prior to Admission medications   Medication Sig Start Date End Date Taking? Authorizing Provider  albuterol (PROVENTIL HFA;VENTOLIN HFA) 108 (90 BASE) MCG/ACT inhaler Inhale 2 puffs into the lungs every 4 (four) hours as needed for wheezing or shortness of breath. 09/09/15   Sanjuana Kava, NP  ARIPiprazole (ABILIFY) 5 MG tablet Take 1 tablet (5 mg total) by mouth daily. For mood control Patient not taking: Reported on 02/14/2016 09/09/15   Sanjuana Kava, NP  citalopram (CELEXA) 10 MG tablet Take 3 tablets (30 mg total) by mouth daily. For depression 09/09/15   Sanjuana Kava, NP  hydrOXYzine (ATARAX/VISTARIL) 50 MG tablet Take 1 tablet (50 mg total) by mouth every 6 (six) hours as needed for anxiety. Patient not taking: Reported on 02/14/2016 09/09/15   Sanjuana Kava, NP  lamoTRIgine (LAMICTAL) 25 MG tablet Take 1 tablet (25 mg total) by mouth daily. For mood stabilization 09/09/15   Sanjuana Kava, NP  nicotine (NICODERM CQ - DOSED IN MG/24 HOURS) 21 mg/24hr patch Place 1 patch (21 mg total) onto the skin daily. For smoking cessation Patient not taking: Reported on 02/14/2016 09/09/15   Sanjuana Kava, NP  traZODone (DESYREL) 50 MG tablet Take 1 tablet (50 mg total) by  mouth at bedtime and may repeat dose one time if needed. For sleep Patient not taking: Reported on 02/14/2016 09/09/15   Sanjuana KavaAgnes I Nwoko, NP   BP 122/78 mmHg  Pulse 106  Temp(Src) 98 F (36.7 C) (Oral)  Resp 18  Ht 5\' 11"  (1.803 m)  Wt 148 lb 1 oz (67.161 kg)  BMI 20.66 kg/m2  SpO2 100% Physical Exam  Constitutional: He is oriented to person, place, and time. He appears well-developed and well-nourished. No distress.  HENT:  Head: Normocephalic and atraumatic.  Eyes: Conjunctivae and EOM are normal.  Neck: Neck supple. No tracheal deviation present.  Cardiovascular:  Normal rate, regular rhythm and normal heart sounds.   Pulmonary/Chest: Effort normal and breath sounds normal. No respiratory distress. He has no wheezes. He has no rales.  Genitourinary:  No CVA tenderness.  Musculoskeletal: Normal range of motion. He exhibits tenderness.  Tenderness noted to lumbar spine at the L5-S1 spine on palpation. No crepitus or step-offs. Able to ambulate without assistance. 5/5 strength in BLE, with intact patellar reflexes. Skin overlying back appears normal.  Neurological: He is alert and oriented to person, place, and time.  Skin: Skin is warm and dry.  Psychiatric: He has a normal mood and affect. His behavior is normal.  Nursing note and vitals reviewed.   ED Course  Procedures (including critical care time)  DIAGNOSTIC STUDIES: Oxygen Saturation is 100% on RA, normal by my interpretation.    COORDINATION OF CARE: 11:00 AM - Discussed treatment plan with pt at bedside which includes muscle relaxants, continuing ibuprofen, gentle massage, and heating pad. Strict return precautions given. Pt verbalized understanding and agreed to plan.   MDM   Final diagnoses:  Muscle spasm of back   Patient with recurrent back pain.  No neurological deficits and normal neuro exam.  Patient can walk but states is painful. He is well appearing. No loss of bowel or bladder control.  No concern for cauda equina.  No fever, night sweats, weight loss, h/o cancer, IVDU.  RICE protocol and pain medicine indicated and discussed with patient.  Will discharge with Robaxin and ibuprofen.  Strict return precautions given.  Pt verbalized understanding and agreed to plan.    BP 122/78 mmHg  Pulse 90  Temp(Src) 98 F (36.7 C) (Oral)  Resp 18  Ht 5\' 11"  (1.803 m)  Wt 67.161 kg  BMI 20.66 kg/m2  SpO2 100%   I personally performed the services described in this documentation, which was scribed in my presence. The recorded information has been reviewed and is accurate.        Fayrene HelperBowie Lyriq Finerty, PA-C 03/13/16 1115  Benjiman CoreNathan Pickering, MD 03/13/16 [phone]

## 2017-01-22 ENCOUNTER — Ambulatory Visit: Payer: Self-pay | Admitting: Family Medicine

## 2017-03-25 ENCOUNTER — Emergency Department (HOSPITAL_COMMUNITY): Payer: Self-pay

## 2017-03-25 ENCOUNTER — Encounter (HOSPITAL_COMMUNITY): Payer: Self-pay | Admitting: *Deleted

## 2017-03-25 ENCOUNTER — Emergency Department (HOSPITAL_COMMUNITY)
Admission: EM | Admit: 2017-03-25 | Discharge: 2017-03-25 | Disposition: A | Discharge status: Home / Self Care | Payer: Self-pay | Attending: Emergency Medicine | Admitting: Emergency Medicine

## 2017-03-25 DIAGNOSIS — F17210 Nicotine dependence, cigarettes, uncomplicated: Secondary | ICD-10-CM | POA: Insufficient documentation

## 2017-03-25 DIAGNOSIS — R0602 Shortness of breath: Secondary | ICD-10-CM | POA: Insufficient documentation

## 2017-03-25 DIAGNOSIS — E119 Type 2 diabetes mellitus without complications: Secondary | ICD-10-CM | POA: Insufficient documentation

## 2017-03-25 DIAGNOSIS — Z79899 Other long term (current) drug therapy: Secondary | ICD-10-CM | POA: Insufficient documentation

## 2017-03-25 DIAGNOSIS — J45909 Unspecified asthma, uncomplicated: Secondary | ICD-10-CM | POA: Insufficient documentation

## 2017-03-25 LAB — CBC WITH DIFFERENTIAL/PLATELET
BASOS PCT: 0 %
Basophils Absolute: 0.1 10*3/uL (ref 0.0–0.1)
EOS ABS: 0.4 10*3/uL (ref 0.0–0.7)
EOS PCT: 3 %
HCT: 40.7 % (ref 39.0–52.0)
HEMOGLOBIN: 14.6 g/dL (ref 13.0–17.0)
Lymphocytes Relative: 47 %
Lymphs Abs: 5.6 10*3/uL — ABNORMAL HIGH (ref 0.7–4.0)
MCH: 27.9 pg (ref 26.0–34.0)
MCHC: 35.9 g/dL (ref 30.0–36.0)
MCV: 77.7 fL — ABNORMAL LOW (ref 78.0–100.0)
Monocytes Absolute: 1 10*3/uL (ref 0.1–1.0)
Monocytes Relative: 8 %
NEUTROS PCT: 42 %
Neutro Abs: 5.2 10*3/uL (ref 1.7–7.7)
PLATELETS: 326 10*3/uL (ref 150–400)
RBC: 5.24 MIL/uL (ref 4.22–5.81)
RDW: 13.4 % (ref 11.5–15.5)
WBC: 12.2 10*3/uL — AB (ref 4.0–10.5)

## 2017-03-25 LAB — COMPREHENSIVE METABOLIC PANEL
ALBUMIN: 4.3 g/dL (ref 3.5–5.0)
ALK PHOS: 67 U/L (ref 38–126)
ALT: 17 U/L (ref 17–63)
ANION GAP: 13 (ref 5–15)
AST: 26 U/L (ref 15–41)
BUN: 6 mg/dL (ref 6–20)
CHLORIDE: 103 mmol/L (ref 101–111)
CO2: 24 mmol/L (ref 22–32)
Calcium: 9.5 mg/dL (ref 8.9–10.3)
Creatinine, Ser: 0.95 mg/dL (ref 0.61–1.24)
GFR calc Af Amer: >60 mL/min (ref >60)
GFR calc non Af Amer: >60 mL/min (ref >60)
Glucose, Bld: 93 mg/dL (ref 65–99)
POTASSIUM: 3.5 mmol/L (ref 3.5–5.1)
SODIUM: 140 mmol/L (ref 135–145)
TOTAL PROTEIN: 7.3 g/dL (ref 6.5–8.1)
Total Bilirubin: 0.4 mg/dL (ref 0.3–1.2)

## 2017-03-25 MED ORDER — IPRATROPIUM-ALBUTEROL 0.5-2.5 (3) MG/3ML IN SOLN
3.0000 mL | Freq: Once | RESPIRATORY_TRACT | Status: AC
  Administered 2017-03-25: 3 mL via RESPIRATORY_TRACT
  Filled 2017-03-25: qty 3

## 2017-03-25 MED ORDER — ALBUTEROL SULFATE HFA 108 (90 BASE) MCG/ACT IN AERS
2.0000 | INHALATION_SPRAY | Freq: Once | RESPIRATORY_TRACT | Status: AC
  Administered 2017-03-25: 2 via RESPIRATORY_TRACT
  Filled 2017-03-25: qty 6.7

## 2017-03-25 NOTE — ED Notes (Signed)
Patient transported to CT 

## 2017-03-25 NOTE — ED Notes (Addendum)
Patient able to speak in full sentences.  Lungs clear bilaterally.  CXR completed

## 2017-03-25 NOTE — ED Provider Notes (Signed)
[facility]-EMERGENCY DEPT Provider Note   CSN: [id number] Arrival date & time: 03/25/17  0105   By signing my name below, I, Dalton [name], attest that this documentation has been prepared under the direction and in the presence of [name], MD  Electronically Signed: Clovis [name], ED Scribe. 03/25/17. 2:00 AM.   History   Chief Complaint Chief Complaint  Patient presents with  . Shortness of Breath    HPI Comments:  PHEONIX [name] is a 31 y.o. [name], with a PMHx of asthma as a child and family hx of cancer, who presents to the Emergency Department complaining of intermittent episodes of SOB x 1 month which last for 15-20 minutes. He notes his symptoms worsened 2 days ago and states his SOB is not subsiding. Pt reports intermittent pain to his left side, recent sneezing, rhinorrhea, a productive cough with minimal blood and a decrease in appetite. No alleviating or aggravating factors noted. Pt denies fevers or any other associated symptoms. Pt also denies any recent long distance travel or recent surgeries. Pt is a smoker. No other complaints noted at this time.    The history is provided by the patient. No language interpreter was used.    Past Medical History:  Diagnosis Date  . Allergy   . Asthma   . Bipolar 1 disorder (HCC)   . Pulmonary tuberculosis     Patient Active Problem List   Diagnosis Date Noted  . 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 Substance induced mood disorder (HCC) 01/30/2016  . Severe recurrent major depressive disorder with psychotic features (HCC)   . 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 Alcohol use disorder, severe, dependence (HCC) 02/13/2015  . Alcohol withdrawal syndrome without complication (HCC) 02/13/2015  . Opioid use disorder, severe, dependence (HCC) 02/13/2015  . Opioid withdrawal (HCC) 02/13/2015  . Opiate dependence, continuous (HCC) 10/13/2014  . 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 Eyelid abnormality 03/12/2012  . DIABETES MELLITUS, TYPE II 11/10/2010  . ABSCESS, GLUTEAL 10/20/2010  . CERVICAL LYMPHADENOPATHY 05/16/2010  . OTH SPEC PULMONARY TB-HISTO DX 10/02/2008  . VIRAL URI 10/02/2008  . PLEURAL EFFUSION, LEFT 03/22/2008  . ALLERGIC RHINITIS 02/01/2008  . ASTHMA 02/01/2008    Past Surgical History:  Procedure Laterality Date  . TONSILLECTOMY         Home Medications    Prior to Admission medications   Medication Sig Start Date End Date Taking? Authorizing Provider  albuterol (PROVENTIL HFA;VENTOLIN HFA) 108 (90 BASE) MCG/ACT inhaler Inhale 2 puffs into the lungs every 4 (four) hours as needed for wheezing or shortness of breath. 09/09/15   Sanjuana Kava, NP  ARIPiprazole (ABILIFY) 5 MG tablet Take 1 tablet (5 mg total) by mouth daily. For mood control Patient not taking: Reported on 02/14/2016 09/09/15   Sanjuana Kava, NP  citalopram (CELEXA) 10 MG tablet Take 3 tablets (30 mg total) by mouth daily. For depression 09/09/15   Sanjuana Kava, NP  hydrOXYzine (ATARAX/VISTARIL) 50 MG tablet Take 1 tablet (50 mg total) by mouth every 6 (six) hours as needed for anxiety. Patient not taking: Reported on 02/14/2016 09/09/15   Sanjuana Kava, NP  ibuprofen (ADVIL,MOTRIN) 800 MG tablet Take 1 tablet (800 mg total) by mouth 3 (three) times daily. 03/13/16   [name], PA-C  lamoTRIgine (LAMICTAL) 25 MG tablet Take 1 tablet (25 mg total) by mouth daily. For mood stabilization 09/09/15   Sanjuana Kava, NP  methocarbamol (  ROBAXIN) 500 MG tablet Take 1 tablet (500 mg total) by mouth 2 (two) times daily. 03/13/16   [name], PA-C  nicotine (NICODERM CQ - DOSED IN MG/24 HOURS) 21 mg/24hr patch Place 1 patch (21 mg total) onto the skin daily. For smoking cessation Patient not taking: Reported on 02/14/2016 09/09/15   Sanjuana Kava, NP  traZODone (DESYREL) 50 MG tablet Take 1 tablet (50 mg total) by mouth at bedtime and may repeat dose one time if needed. For  sleep Patient not taking: Reported on 02/14/2016 09/09/15   Sanjuana Kava, NP    Family History No family history on file.  Social History Social History  Substance Use Topics  . Smoking status: Current Every Day Smoker    Packs/day: 0.50    Types: Cigarettes  . Smokeless tobacco: Never Used     Comment: 4 ciggs qd  . Alcohol use Yes     Allergies   Patient has no known allergies.   Review of Systems Review of Systems All other systems reviewed and are negative for acute change except as noted in the HPI.  Physical Exam Updated Vital Signs BP 129/86   Pulse 71   Temp 97.9 F (36.6 C) (Oral)   Resp (!) 26   Ht  (1.702 m)   Wt 148 lb (67.1 kg)   SpO2 97%   BMI 23.18 kg/m   Physical Exam  Constitutional: He is oriented to person, place, and time. Vital signs are normal. He appears well-developed and well-nourished.  Non-toxic appearance. He does not appear ill. No distress.  HENT:  Head: Normocephalic and atraumatic.  Nose: Nose normal.  Mouth/Throat: Oropharynx is clear and moist. No oropharyngeal exudate.  Eyes: Conjunctivae and EOM are normal. Pupils are equal, round, and reactive to light. No scleral icterus.  Neck: Normal range of motion. Neck supple. No tracheal deviation, no edema, no erythema and normal range of motion present. No thyroid mass and no thyromegaly present.  Cardiovascular: Normal rate, regular rhythm, S1 normal, S2 normal, normal heart sounds, intact distal pulses and normal pulses.  Exam reveals no gallop and no friction rub.   No murmur heard. Pulmonary/Chest: Effort normal and breath sounds normal. No respiratory distress. He has no wheezes. He has no rhonchi. He has no rales.  Abdominal: Soft. Normal appearance and bowel sounds are normal. He exhibits no distension, no ascites and no mass. There is no hepatosplenomegaly. There is no tenderness. There is no rebound, no guarding and no CVA tenderness.  Musculoskeletal: Normal range of  motion. He exhibits no edema or tenderness.  Lymphadenopathy:    He has no cervical adenopathy.  Neurological: He is alert and oriented to person, place, and time. He has normal strength. No cranial nerve deficit or sensory deficit.  Skin: Skin is warm, dry and intact. No petechiae and no rash noted. He is not diaphoretic. No erythema. No pallor.  Nursing note and vitals reviewed.    ED Treatments / Results  DIAGNOSTIC STUDIES:  Oxygen Saturation is 100% on RA, normal by my interpretation.    COORDINATION OF CARE:  1:Dalton AM Discussed treatment plan with pt at bedside and pt agreed to plan.  Labs (all labs ordered are listed, but only abnormal results are displayed) Labs Reviewed  CBC WITH DIFFERENTIAL/PLATELET - Abnormal; Notable for the following:       Result Value   WBC 12.2 (*)    MCV 77.7 (*)    Lymphs Abs 5.6 (*)  All other components within normal limits  COMPREHENSIVE METABOLIC PANEL    EKG  EKG Interpretation  Date/Time:  Thursday March 25 2017 01:10:56 EDT Ventricular Rate:  107 PR Interval:  132 QRS Duration: 86 QT Interval:  328 QTC Calculation: 437 R Axis:   98 Text Interpretation:  Sinus tachycardia Rightward axis Borderline ECG Rate faster Confirmed by Erroll Luna [phone]) on 03/25/2017 1:43:29 AM       Radiology Dg Chest 2 View  Result Date: 03/25/2017 CLINICAL DATA:  Shortness of breath today. EXAM: CHEST  2 VIEW COMPARISON:  Report from chest radiograph 10/13/2014, images not available. FINDINGS: Mild hyperinflation. Normal heart size and mediastinal contours. No focal airspace disease. Minimal blunting of the left costophrenic angle. No pulmonary edema. No pneumothorax. No acute osseous abnormalities. IMPRESSION: Mild hyperinflation, can be seen with bronchitis or asthma. Minimal blunting of left costophrenic angle, may be trace pleural effusion. Electronically Signed   By: Rubye Oaks M.D.   On: 03/25/2017 01:42   Ct Chest Wo  Contrast  Result Date: 03/25/2017 CLINICAL DATA:  Shortness of breath for 2 months, getting worse. Productive cough for 1 month. Intermittent chest pain and hemoptysis. History of TB and asthma. EXAM: CT CHEST WITHOUT CONTRAST TECHNIQUE: Multidetector CT imaging of the chest was performed following the standard protocol without IV contrast. COMPARISON:  Chest radiograph 03/25/2017 FINDINGS: Cardiovascular: No significant vascular findings. Normal heart size. No pericardial effusion. Mediastinum/Nodes: No enlarged mediastinal or axillary lymph nodes. Thyroid gland, trachea, and esophagus demonstrate no significant findings. Lungs/Pleura: Lungs are clear. No pleural effusion or pneumothorax. Upper Abdomen: No acute abnormality. Musculoskeletal: No chest wall mass or suspicious bone lesions identified. IMPRESSION: No acute process demonstrated in the chest.  Lungs are clear. Electronically Signed   By: Burman Nieves M.D.   On: 03/25/2017 02:54    Procedures Procedures (including critical care time)  Medications Ordered in ED Medications  ipratropium-albuterol (DUONEB) 0.5-2.5 (3) MG/3ML nebulizer solution 3 mL (3 mLs Nebulization Given 03/25/17 0203)     Initial Impression / Assessment and Plan / ED Course  I have reviewed the triage vital signs and the nursing notes.  Pertinent labs & imaging results that were available during my care of the patient were reviewed by me and considered in my medical decision making (see chart for details).      Patient presents to the ED for worsening SOB for 1 month.  He finally tells me that he is concerned for cancer because he smokes and has as family history.  He was given a breathing treatment in the ED and CT chest was ordered for reassurance.  No masses were seen. He was still strongly encouraged to see a PCP within 3 days for further risk stratification. He demonstrates good understanding of this plan. He appears well and in NAD. VS remain within his  normal limits and he is safe for Dc.    Final Clinical Impressions(s) / ED Diagnoses   Final diagnoses:  None    New Prescriptions New Prescriptions   No medications on file  I personally performed the services described in this documentation, which was scribed in my presence. The recorded information has been reviewed and is accurate.       [name], MD 03/25/17 [phone]

## 2017-03-25 NOTE — ED Notes (Signed)
Pt reports decreased appetite and night sweats for approx 1 month.

## 2018-05-22 ENCOUNTER — Emergency Department (HOSPITAL_COMMUNITY)
Admission: EM | Admit: 2018-05-22 | Discharge: 2018-05-23 | Disposition: A | Discharge status: Home / Self Care | Payer: Self-pay | Attending: Emergency Medicine | Admitting: Emergency Medicine

## 2018-05-22 ENCOUNTER — Other Ambulatory Visit: Payer: Self-pay

## 2018-05-22 ENCOUNTER — Encounter (HOSPITAL_COMMUNITY): Payer: Self-pay | Admitting: *Deleted

## 2018-05-22 DIAGNOSIS — F17210 Nicotine dependence, cigarettes, uncomplicated: Secondary | ICD-10-CM | POA: Insufficient documentation

## 2018-05-22 DIAGNOSIS — E119 Type 2 diabetes mellitus without complications: Secondary | ICD-10-CM | POA: Insufficient documentation

## 2018-05-22 DIAGNOSIS — F319 Bipolar disorder, unspecified: Secondary | ICD-10-CM | POA: Insufficient documentation

## 2018-05-22 DIAGNOSIS — R45851 Suicidal ideations: Secondary | ICD-10-CM | POA: Insufficient documentation

## 2018-05-22 DIAGNOSIS — Z79899 Other long term (current) drug therapy: Secondary | ICD-10-CM | POA: Insufficient documentation

## 2018-05-22 DIAGNOSIS — F1110 Opioid abuse, uncomplicated: Secondary | ICD-10-CM | POA: Insufficient documentation

## 2018-05-22 DIAGNOSIS — F1410 Cocaine abuse, uncomplicated: Secondary | ICD-10-CM | POA: Insufficient documentation

## 2018-05-22 DIAGNOSIS — F1910 Other psychoactive substance abuse, uncomplicated: Secondary | ICD-10-CM

## 2018-05-22 DIAGNOSIS — J45909 Unspecified asthma, uncomplicated: Secondary | ICD-10-CM | POA: Insufficient documentation

## 2018-05-22 LAB — CBC
HCT: 45.1 % (ref 39.0–52.0)
Hemoglobin: 16 g/dL (ref 13.0–17.0)
MCH: 28.9 pg (ref 26.0–34.0)
MCHC: 35.5 g/dL (ref 30.0–36.0)
MCV: 81.6 fL (ref 78.0–100.0)
PLATELETS: 362 10*3/uL (ref 150–400)
RBC: 5.53 MIL/uL (ref 4.22–5.81)
RDW: 13.8 % (ref 11.5–15.5)
WBC: 12.9 10*3/uL — ABNORMAL HIGH (ref 4.0–10.5)

## 2018-05-22 LAB — RAPID URINE DRUG SCREEN, HOSP PERFORMED
Amphetamines: NOT DETECTED
Benzodiazepines: NOT DETECTED
Cocaine: POSITIVE — AB
OPIATES: POSITIVE — AB
Tetrahydrocannabinol: NOT DETECTED

## 2018-05-22 LAB — COMPREHENSIVE METABOLIC PANEL
ALBUMIN: 4.2 g/dL (ref 3.5–5.0)
ALK PHOS: 62 U/L (ref 38–126)
ALT: 30 U/L (ref 17–63)
AST: 41 U/L (ref 15–41)
Anion gap: 10 (ref 5–15)
BILIRUBIN TOTAL: 0.4 mg/dL (ref 0.3–1.2)
BUN: 10 mg/dL (ref 6–20)
CALCIUM: 9.5 mg/dL (ref 8.9–10.3)
CO2: 29 mmol/L (ref 22–32)
CREATININE: 1.19 mg/dL (ref 0.61–1.24)
Chloride: 105 mmol/L (ref 101–111)
GFR calc Af Amer: >60 mL/min (ref >60)
GFR calc non Af Amer: >60 mL/min (ref >60)
GLUCOSE: 126 mg/dL — AB (ref 65–99)
Potassium: 3.7 mmol/L (ref 3.5–5.1)
Sodium: 144 mmol/L (ref 135–145)
TOTAL PROTEIN: 6.8 g/dL (ref 6.5–8.1)

## 2018-05-22 LAB — ETHANOL: Alcohol, Ethyl (B): 158 mg/dL — ABNORMAL HIGH (ref <10)

## 2018-05-22 NOTE — BH Assessment (Addendum)
Assessment Note  Dalton [name] is an 32 y.o. male who presents to the ED voluntarily. Pt reports he is suicidal with a plan to drive his car into oncoming traffic or drive his car into a tree. Pt states he has been feeling suicidal for the past several days due to being homeless, using crack cocaine, heroin, and drinking alcohol everyday. Pt appears disheveled during the assessment. Pt has dirty hands and nails. Pt has a strong potent body odor in the assessment room. Pt also had his hands in his pants while this writer was conducting the assessment. Pt has a hx of inpt admissions c/o similar concerns in 2016 and 2015. Pt denies HI and denies AVH at present.    TTS consulted with Donell Sievert, PA who recommends inpt treatment. EDP Mancel Bale, MD notified. Triage staff notified.  TTS consulted with [facility] who requests updated vitals due to elevated pulse rate. Pt tentatively accepted to [facility] 300-[facility], Attending provider will be Dr. Jola Babinski, MD. Call to report [phone]. Bed is available now pending updated vitals and medical clearance.   Diagnosis: Bipolar I disorder; Cocaine use disorder, severe; Opiate use disorder, severe; Alcohol use disorder, severe   Past Medical History:  Past Medical History:  Diagnosis Date  . Allergy   . Asthma   . Bipolar 1 disorder (HCC)   . Pulmonary tuberculosis     Past Surgical History:  Procedure Laterality Date  . TONSILLECTOMY      Family History: No family history on file.  Social History:  reports that he has been smoking cigarettes.  He has been smoking about 1.00 pack per day. He has never used smokeless tobacco. He reports that he drinks alcohol. He reports that he has current or past drug history.  Additional Social History:  Alcohol / Drug Use Pain Medications: See MAR Prescriptions: See MAR Over the Counter: See MAR History of alcohol / drug use?: Yes Substance #1 Name of Substance 1: alcohol 1 - Age of First Use: teens 1 - Amount  (size/oz): varies, current BAL 158 1 - Frequency: daily 1 - Duration: ongoing 1 - Last Use / Amount: 05/22/18 Substance #2 Name of Substance 2: Crack cocaine 2 - Age of First Use: 31 2 - Amount (size/oz): varies 2 - Frequency: daily 2 - Duration: ongoing 2 - Last Use / Amount: 05/21/18 Substance #3 Name of Substance 3: Heroin 3 - Age of First Use: unknown 3 - Amount (size/oz): varies 3 - Frequency: occasional 3 - Duration: ongoing 3 - Last Use / Amount: unknown  CIWA: CIWA-Ar BP: 110/76 Pulse Rate: (!) 107 Nausea and Vomiting: no nausea and no vomiting Tactile Disturbances: none Tremor: two Auditory Disturbances: not present Paroxysmal Sweats: no sweat visible Visual Disturbances: not present Anxiety: two Headache, Fullness in Head: none present Agitation: somewhat more than normal activity Orientation and Clouding of Sensorium: oriented and can do serial additions CIWA-Ar Total: 5 COWS:    Allergies: No Known Allergies  Home Medications:  (Not in a hospital admission)  OB/GYN Status:  No LMP for male patient.  General Assessment Data Location of Assessment: [facility] ED TTS Assessment: In system Is this a Tele or Face-to-Face Assessment?: Face-to-Face Is this an Initial Assessment or a Re-assessment for this encounter?: Initial Assessment Marital status: Single Is patient pregnant?: No Pregnancy Status: No Living Arrangements: Other (Comment)(homeless) Can pt return to current living arrangement?: Yes Admission Status: Voluntary Is patient capable of signing voluntary admission?: Yes Referral Source: Self/Family/Friend Insurance type: none  Crisis Care Plan Living Arrangements: Other (Comment)(homeless) Name of Psychiatrist: none Name of Therapist: none  Education Status Is patient currently in school?: No Is the patient employed, unemployed or receiving disability?: Employed  Risk to self with the past 6 months Suicidal Ideation: Yes-Currently  Present Has patient been a risk to self within the past 6 months prior to admission? : Yes Suicidal Intent: Yes-Currently Present Has patient had any suicidal intent within the past 6 months prior to admission? : Yes Is patient at risk for suicide?: Yes Suicidal Plan?: Yes-Currently Present Has patient had any suicidal plan within the past 6 months prior to admission? : Yes Specify Current Suicidal Plan: pt states while driving his car he had thoughts of driving into another car or hitting a tree in order to kill himself  Access to Means: Yes Specify Access to Suicidal Means: pt has access to traffic and a vehicle  What has been your use of drugs/alcohol within the last 12 months?: daily alcohol, cocaine, and heroin use  Previous Attempts/Gestures: No Triggers for Past Attempts: None known Intentional Self Injurious Behavior: None Family Suicide History: No Recent stressful life event(s): Job Loss, Financial Problems, Turmoil (Comment)(homeless) Persecutory voices/beliefs?: No Depression: Yes Depression Symptoms: Fatigue, Feeling worthless/self pity Substance abuse history and/or treatment for substance abuse?: Yes Suicide prevention information given to non-admitted patients: Not applicable  Risk to Others within the past 6 months Homicidal Ideation: No Does patient have any lifetime risk of violence toward others beyond the six months prior to admission? : No Thoughts of Harm to Others: No Current Homicidal Intent: No Current Homicidal Plan: No Access to Homicidal Means: No History of harm to others?: No Assessment of Violence: None Noted Does patient have access to weapons?: No Criminal Charges Pending?: No Does patient have a court date: No Is patient on probation?: No  Psychosis Hallucinations: None noted Delusions: None noted  Mental Status Report Appearance/Hygiene: In scrubs, Body odor, Poor hygiene, Disheveled Eye Contact: Poor Motor Activity: Unremarkable Speech:  Slow Level of Consciousness: Drowsy Mood: Depressed, Helpless Affect: Flat Anxiety Level: None Thought Processes: Relevant, Coherent Judgement: Impaired Orientation: Person, Time, Place, Situation, Appropriate for developmental age Obsessive Compulsive Thoughts/Behaviors: None  Cognitive Functioning Concentration: Fair Memory: Remote Intact, Recent Intact Is patient IDD: No Is patient DD?: No Insight: Poor Impulse Control: Poor Appetite: Good Have you had any weight changes? : No Change Sleep: No Change Total Hours of Sleep: 8 Vegetative Symptoms: None  ADLScreening [facility] Assessment Services) Patient's cognitive ability adequate to safely complete daily activities?: Yes Patient able to express need for assistance with ADLs?: Yes Independently performs ADLs?: Yes (appropriate for developmental age)  Prior Inpatient Therapy Prior Inpatient Therapy: Yes Prior Therapy Dates: 2016, 2015 Prior Therapy Facilty/Provider(s): [facility] Reason for Treatment: SA, BIPOLAR   Prior Outpatient Therapy Prior Outpatient Therapy: No Does patient have an ACCT team?: No Does patient have Intensive In-House Services?  : No Does patient have [facility] services? : No Does patient have P4CC services?: No  ADL Screening (condition at time of admission) Patient's cognitive ability adequate to safely complete daily activities?: Yes Is the patient deaf or have difficulty hearing?: No Does the patient have difficulty seeing, even when wearing glasses/contacts?: No Does the patient have difficulty concentrating, remembering, or making decisions?: No Patient able to express need for assistance with ADLs?: Yes Does the patient have difficulty dressing or bathing?: No Independently performs ADLs?: Yes (appropriate for developmental age) Does the patient have difficulty walking or climbing stairs?:  No Weakness of Legs: None Weakness of Arms/Hands: None  Home Assistive Devices/Equipment Home Assistive  Devices/Equipment: None    Abuse/Neglect Assessment (Assessment to be complete while patient is alone) Abuse/Neglect Assessment Can Be Completed: Yes Physical Abuse: Denies Verbal Abuse: Denies Sexual Abuse: Denies Exploitation of patient/patient's resources: Denies Self-Neglect: Denies     Merchant navy officerAdvance Directives (For Healthcare) Does Patient Have a Medical Advance Directive?: No Would patient like information on creating a medical advance directive?: No - Patient declined    Additional Information 1:1 In Past 12 Months?: No CIRT Risk: No Elopement Risk: No Does patient have medical clearance?: Yes     Disposition: TTS consulted with Donell SievertSpencer Simon, PA who recommends inpt treatment. EDP Mancel BaleWentz, Elliott, MD notified. Triage staff notified.  [facility] who requests updated vitals due to elevated pulse rate. [facility], Attending provider will be Dr. Jola Babinskilary, MD. Call to report [phone]. Bed is available now pending updated vitals and medical clearance.   Disposition Initial Assessment Completed for this Encounter: Yes Disposition of Patient: Admit Type of inpatient treatment program: Adult(per Donell SievertSpencer Simon, PA) Patient refused recommended treatment: No  On Site Evaluation by:   Reviewed with Physician:    Karolee OhsAquicha R Thelma Lorenzetti 05/22/2018 11:18 PM

## 2018-05-22 NOTE — Progress Notes (Signed)
TTS consulted with Donell SievertSpencer Simon, PA who recommends inpt treatment. EDP Mancel BaleWentz, Elliott, MD notified. Triage staff notified.  [facility] vitals due to elevated pulse rate. [facility], Attending provider will be Dr. Jola Babinskilary, MD. Call to report [phone]. Bed is available now pending updated vitals and medical clearance.   Princess BruinsAquicha Vietta Bonifield, MSW, LCSW Therapeutic Triage Specialist  [phone]

## 2018-05-22 NOTE — ED Notes (Signed)
Bed: WTR5 Expected date:  Expected time:  Means of arrival:  Comments: 

## 2018-05-22 NOTE — ED Notes (Signed)
[facility].

## 2018-05-22 NOTE — ED Notes (Signed)
Bed: [facility]3 Expected date:  Expected time:  Means of arrival:  Comments: 

## 2018-05-22 NOTE — Progress Notes (Signed)
VOL paperwork signed and faxed to [facility].  Marquita Lias, MSW, LCSW Therapeutic Triage Specialist  [phone]  

## 2018-05-22 NOTE — ED Notes (Signed)
Pt wanded by security and belongings were taken home by his sister.

## 2018-05-22 NOTE — ED Triage Notes (Signed)
Pt reports using heroin 1 hour prior to arrival. Pt also reports doing crack cocaine 2 hours prior to arrival. Pt reports SI but without a plan.  Pt denies any pain or any other abnormal symptoms.

## 2018-05-22 NOTE — ED Provider Notes (Signed)
[facility]-EMERGENCY DEPT Provider Note   CSN: [id number] Arrival date & time: 05/22/18  1935     History   Chief Complaint Chief Complaint  Patient presents with  . Drug Problem  . Suicidal    HPI Dalton [name] is a 32 y.o. male.  HPI   Patient presents for evaluation of suicidal ideation.  He states he has "escalated to a new drug."  He describes this new drug is being cocaine, which he is been using for 6months and is concerned that it is causing him problems.  He feels like he is spending more cocaine and he has to spend.  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  He has had thoughts of suicide, by driving his car into a tree or into another vehicle.  He states that he has "played chicken with a car."  He denies fever, chills, headache, weakness or dizziness.  He states that he is homeless.  There are no other known modifying factors.  Past Medical History:  Diagnosis Date  . Allergy   . Asthma   . Bipolar 1 disorder (HCC)   . Pulmonary tuberculosis     Patient Active Problem List   Diagnosis Date Noted  . 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 Substance induced mood disorder (HCC) 01/30/2016  . Severe recurrent major depressive disorder with psychotic features (HCC)   . 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 Alcohol use disorder, severe, dependence (HCC) 02/13/2015  . Alcohol withdrawal syndrome without complication (HCC) 02/13/2015  . Opioid use disorder, severe, dependence (HCC) 02/13/2015  . Opioid withdrawal (HCC) 02/13/2015  . Opiate dependence, continuous (HCC) 10/13/2014  . 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 Eyelid abnormality 03/12/2012  . DIABETES MELLITUS, TYPE II 11/10/2010  . ABSCESS, GLUTEAL 10/20/2010  . CERVICAL LYMPHADENOPATHY 05/16/2010  . OTH SPEC PULMONARY TB-HISTO DX 10/02/2008  . VIRAL URI 10/02/2008  .  PLEURAL EFFUSION, LEFT 03/22/2008  . ALLERGIC RHINITIS 02/01/2008  . ASTHMA 02/01/2008    Past Surgical History:  Procedure Laterality Date  . TONSILLECTOMY          Home Medications    Prior to Admission medications   Medication Sig Start Date End Date Taking? Authorizing Provider  albuterol (PROVENTIL HFA;VENTOLIN HFA) 108 (90 BASE) MCG/ACT inhaler Inhale 2 puffs into the lungs every 4 (four) hours as needed for wheezing or shortness of breath. Patient not taking: Reported on 05/22/2018 09/09/15   Armandina Stammer I, NP  ARIPiprazole (ABILIFY) 5 MG tablet Take 1 tablet (5 mg total) by mouth daily. For mood control Patient not taking: Reported on 02/14/2016 09/09/15   Armandina Stammer I, NP  citalopram (CELEXA) 10 MG tablet Take 3 tablets (30 mg total) by mouth daily. For depression Patient not taking: Reported on 05/22/2018 09/09/15   Armandina Stammer I, NP  hydrOXYzine (ATARAX/VISTARIL) 50 MG tablet Take 1 tablet (50 mg total) by mouth every 6 (six) hours as needed for anxiety. Patient not taking: Reported on 02/14/2016 09/09/15   Armandina Stammer I, NP  ibuprofen (ADVIL,MOTRIN) 800 MG tablet Take 1 tablet (800 mg total) by mouth 3 (three) times daily. Patient not taking: Reported on 05/22/2018 03/13/16   [name], PA-C  lamoTRIgine (LAMICTAL) 25 MG tablet Take 1 tablet (25 mg total) by mouth daily. For mood stabilization Patient not taking: Reported on 05/22/2018 09/09/15   Sanjuana Kava, NP  methocarbamol (ROBAXIN) 500 MG tablet Take 1 tablet (500 mg total) by mouth 2 (two) times daily. Patient not taking: Reported on 05/22/2018 03/13/16   [name], PA-C  nicotine (NICODERM CQ - DOSED IN MG/24 HOURS) 21 mg/24hr patch Place 1 patch (21 mg total) onto the skin daily. For smoking cessation Patient not taking: Reported on 02/14/2016 09/09/15   Armandina Stammer I, NP  traZODone (DESYREL) 50 MG tablet Take 1 tablet (50 mg total) by mouth at bedtime and may repeat dose one time if needed. For sleep Patient not  taking: Reported on 02/14/2016 09/09/15   Armandina Stammer I, NP    Family History No family history on file.  Social History Social History   Tobacco Use  . Smoking status: Current Every Day Smoker    Packs/day: 1.00    Types: Cigarettes  . Smokeless tobacco: Never Used  . Tobacco comment: 4 ciggs qd  Substance Use Topics  . Alcohol use: Yes  . Drug use: Yes    Comment: former substance abuse     Allergies   Patient has no known allergies.   Review of Systems Review of Systems  All other systems reviewed and are negative.    Physical Exam Updated Vital Signs BP 110/76   Pulse (!) 107   Temp 98.4 F (36.9 C) (Oral)   Resp 18   Ht 5\' 11"  (1.803 m)   Wt 62.1 kg (137 lb)   SpO2 96%   BMI 19.11 kg/m   Physical Exam  Constitutional: He is oriented to person, place, and time. He appears well-developed and well-nourished. No distress.  HENT:  Head: Normocephalic and atraumatic.  Right Ear: External ear normal.  Left Ear: External ear normal.  Eyes: Pupils are equal, round, and reactive to light. Conjunctivae and EOM are normal.  Neck: Normal range of motion and phonation normal. Neck supple.  Cardiovascular: Normal rate, regular rhythm and normal heart sounds.  Pulmonary/Chest: Effort normal and breath sounds normal. He exhibits no bony tenderness.  Abdominal: Soft. There is no tenderness.  Musculoskeletal: Normal range of motion.  Neurological: He is alert and oriented to person, place, and time. No cranial nerve deficit or sensory deficit. He exhibits normal muscle tone. Coordination normal.  No dysarthria, aphasia  Skin: Skin is warm, dry and intact.  Psychiatric: He has a normal mood and affect. His behavior is normal. Judgment and thought content normal.  No internal responsiveness.  Nursing note and vitals reviewed.    ED Treatments / Results  Labs (all labs ordered are listed, but only abnormal results are displayed) Labs Reviewed  COMPREHENSIVE  METABOLIC PANEL - Abnormal; Notable for the following components:      Result Value   Glucose, Bld 126 (*)    All other components within normal limits  ETHANOL - Abnormal; Notable for the following components:   Alcohol, Ethyl (B) 158 (*)    All other components within normal limits  CBC - Abnormal; Notable for the following components:   WBC 12.9 (*)    All other components within normal limits  RAPID URINE DRUG SCREEN, HOSP PERFORMED - Abnormal; Notable for the following components:   Opiates POSITIVE (*)    Cocaine POSITIVE (*)    Barbiturates   (*)    Value: Result not available. Reagent lot number recalled by manufacturer.   All other components within normal limits    EKG None  Radiology No results found.  Procedures Procedures (including critical care time)  Medications  Ordered in ED Medications - No data to display   Initial Impression / Assessment and Plan / ED Course  I have reviewed the triage vital signs and the nursing notes.  Pertinent labs & imaging results that were available during my care of the patient were reviewed by me and considered in my medical decision making (see chart for details).  Clinical Course as of May 22 2244  Sun May 22, 2018  2212 Normal except white count elevated  cbc(!) [EW]  2243 Normal except elevated glucose  Comprehensive metabolic panel(!) [EW]  2244 High, consistent with alcohol intoxication  Ethanol(!) [EW]  2244 Abnormal, substances of abuse, opiates, cocaine  Rapid urine drug screen (hospital performed)(!) [EW]  2244 At this point the patient is medically cleared for treatment by psychiatry.   [EW]    Clinical Course User Index [EW] Mancel BaleWentz, Lucila Klecka, MD     Patient Vitals for the past 24 hrs:  BP Temp Temp src Pulse Resp SpO2 Height Weight  05/22/18 2127 110/76 - - (!) 107 - - - -  05/22/18 2121 - - - - - - 5\' 11"  (1.803 m) 62.1 kg (137 lb)  05/22/18 2120 110/76 98.4 F (36.9 C) Oral (!) 107 18 96 % - -     TTS consult  Medical Decision Making: Substance abuse, with suicidal ideation and plan to drive car into a tree.  Patient is homeless.  CRITICAL CARE-no Performed by: Mancel BaleElliott Kalima Saylor   Nursing Notes Reviewed/ Care Coordinated Applicable Imaging Reviewed Interpretation of Laboratory Data incorporated into ED treatment   Plan-disposition as per TTS in conjunction with oncoming provider team    Final Clinical Impressions(s) / ED Diagnoses   Final diagnoses:  Suicidal ideation  Cocaine abuse (HCC)  Polysubstance abuse [facility](HCC)   Nursing Notes Reviewed/ Care Coordinated Applicable Imaging Reviewed Interpretation of Laboratory Data incorporated into ED treatment   ED Discharge Orders    None       Mancel BaleWentz, Kynzlie Hilleary, MD 05/22/18 2245

## 2018-05-23 ENCOUNTER — Other Ambulatory Visit: Payer: Self-pay

## 2018-05-23 ENCOUNTER — Encounter (HOSPITAL_COMMUNITY): Payer: Self-pay | Admitting: *Deleted

## 2018-05-23 ENCOUNTER — Inpatient Hospital Stay (HOSPITAL_COMMUNITY)
Admission: AD | Admit: 2018-05-23 | Discharge: 2018-05-26 | DRG: 885 | Disposition: A | Discharge status: Home / Self Care | Payer: No Typology Code available for payment source | Source: Intra-hospital | Attending: Psychiatry | Admitting: Psychiatry

## 2018-05-23 DIAGNOSIS — G4700 Insomnia, unspecified: Secondary | ICD-10-CM | POA: Diagnosis present

## 2018-05-23 DIAGNOSIS — R45851 Suicidal ideations: Secondary | ICD-10-CM | POA: Diagnosis present

## 2018-05-23 DIAGNOSIS — F314 Bipolar disorder, current episode depressed, severe, without psychotic features: Principal | ICD-10-CM | POA: Diagnosis present

## 2018-05-23 DIAGNOSIS — F1120 Opioid dependence, uncomplicated: Secondary | ICD-10-CM | POA: Diagnosis present

## 2018-05-23 DIAGNOSIS — F419 Anxiety disorder, unspecified: Secondary | ICD-10-CM | POA: Diagnosis present

## 2018-05-23 DIAGNOSIS — J45909 Unspecified asthma, uncomplicated: Secondary | ICD-10-CM | POA: Diagnosis present

## 2018-05-23 DIAGNOSIS — F1020 Alcohol dependence, uncomplicated: Secondary | ICD-10-CM | POA: Diagnosis present

## 2018-05-23 DIAGNOSIS — F1420 Cocaine dependence, uncomplicated: Secondary | ICD-10-CM | POA: Diagnosis present

## 2018-05-23 DIAGNOSIS — Z915 Personal history of self-harm: Secondary | ICD-10-CM

## 2018-05-23 DIAGNOSIS — F1994 Other psychoactive substance use, unspecified with psychoactive substance-induced mood disorder: Secondary | ICD-10-CM | POA: Diagnosis present

## 2018-05-23 DIAGNOSIS — F17210 Nicotine dependence, cigarettes, uncomplicated: Secondary | ICD-10-CM | POA: Diagnosis present

## 2018-05-23 DIAGNOSIS — Z590 Homelessness: Secondary | ICD-10-CM | POA: Diagnosis not present

## 2018-05-23 DIAGNOSIS — F1024 Alcohol dependence with alcohol-induced mood disorder: Secondary | ICD-10-CM | POA: Diagnosis present

## 2018-05-23 MED ORDER — ACETAMINOPHEN 325 MG PO TABS
650.0000 mg | ORAL_TABLET | Freq: Four times a day (QID) | ORAL | Status: DC | PRN
  Administered 2018-05-25: 650 mg via ORAL
  Filled 2018-05-23: qty 2

## 2018-05-23 MED ORDER — LORAZEPAM 1 MG PO TABS: 1.0000 mg | ORAL_TABLET | Freq: Two times a day (BID) | ORAL | Status: DC

## 2018-05-23 MED ORDER — LAMOTRIGINE 25 MG PO TABS
25.0000 mg | ORAL_TABLET | Freq: Every day | ORAL | Status: DC
Start: 2018-05-23 — End: 2018-05-25
  Administered 2018-05-23 – 2018-05-25 (×3): 25 mg via ORAL
  Filled 2018-05-23 (×5): qty 1

## 2018-05-23 MED ORDER — ONDANSETRON 4 MG PO TBDP: 4.0000 mg | ORAL_TABLET | Freq: Four times a day (QID) | ORAL | Status: AC | PRN

## 2018-05-23 MED ORDER — ALUM & MAG HYDROXIDE-SIMETH 200-200-20 MG/5ML PO SUSP: 30.0000 mL | ORAL | Status: DC | PRN

## 2018-05-23 MED ORDER — CLONIDINE HCL 0.1 MG PO TABS
0.1000 mg | ORAL_TABLET | Freq: Four times a day (QID) | ORAL | Status: DC
  Administered 2018-05-23 – 2018-05-24 (×4): 0.1 mg via ORAL
  Filled 2018-05-23 (×8): qty 1

## 2018-05-23 MED ORDER — CLONIDINE HCL 0.1 MG PO TABS
0.1000 mg | ORAL_TABLET | Freq: Every day | ORAL | Status: DC
Start: 2018-05-27 — End: 2018-05-24

## 2018-05-23 MED ORDER — LORAZEPAM 1 MG PO TABS: 1.0000 mg | ORAL_TABLET | Freq: Every day | ORAL | Status: DC

## 2018-05-23 MED ORDER — ENSURE ENLIVE PO LIQD
237.0000 mL | Freq: Two times a day (BID) | ORAL | Status: DC
  Administered 2018-05-23 – 2018-05-25 (×5): 237 mL via ORAL

## 2018-05-23 MED ORDER — TRAZODONE HCL 50 MG PO TABS
50.0000 mg | ORAL_TABLET | Freq: Every evening | ORAL | Status: DC | PRN
  Administered 2018-05-25 (×2): 50 mg via ORAL
  Filled 2018-05-23 (×8): qty 1

## 2018-05-23 MED ORDER — VITAMIN B-1 100 MG PO TABS
100.0000 mg | ORAL_TABLET | Freq: Every day | ORAL | Status: DC
  Administered 2018-05-24: 100 mg via ORAL
  Filled 2018-05-23 (×3): qty 1

## 2018-05-23 MED ORDER — ALBUTEROL SULFATE HFA 108 (90 BASE) MCG/ACT IN AERS: 2.0000 | INHALATION_SPRAY | RESPIRATORY_TRACT | Status: DC | PRN

## 2018-05-23 MED ORDER — DICYCLOMINE HCL 20 MG PO TABS: 20.0000 mg | ORAL_TABLET | Freq: Four times a day (QID) | ORAL | Status: DC | PRN

## 2018-05-23 MED ORDER — LORAZEPAM 1 MG PO TABS
1.0000 mg | ORAL_TABLET | Freq: Three times a day (TID) | ORAL | Status: DC
  Administered 2018-05-24 (×2): 1 mg via ORAL
  Filled 2018-05-23 (×2): qty 1

## 2018-05-23 MED ORDER — CLONIDINE HCL 0.1 MG PO TABS
0.1000 mg | ORAL_TABLET | ORAL | Status: DC
  Filled 2018-05-23 (×3): qty 1

## 2018-05-23 MED ORDER — ARIPIPRAZOLE 10 MG PO TABS
10.0000 mg | ORAL_TABLET | Freq: Every day | ORAL | Status: DC
  Administered 2018-05-23 – 2018-05-26 (×4): 10 mg via ORAL
  Filled 2018-05-23 (×2): qty 1
  Filled 2018-05-23: qty 7
  Filled 2018-05-23 (×4): qty 1

## 2018-05-23 MED ORDER — NAPROXEN 500 MG PO TABS
500.0000 mg | ORAL_TABLET | Freq: Two times a day (BID) | ORAL | Status: DC | PRN
  Administered 2018-05-25: 500 mg via ORAL
  Filled 2018-05-23: qty 1

## 2018-05-23 MED ORDER — LORAZEPAM 1 MG PO TABS: 1.0000 mg | ORAL_TABLET | Freq: Four times a day (QID) | ORAL | Status: DC | PRN

## 2018-05-23 MED ORDER — ADULT MULTIVITAMIN W/MINERALS CH
1.0000 | ORAL_TABLET | Freq: Every day | ORAL | Status: DC
  Administered 2018-05-23 – 2018-05-26 (×4): 1 via ORAL
  Filled 2018-05-23 (×5): qty 1

## 2018-05-23 MED ORDER — THIAMINE HCL 100 MG/ML IJ SOLN: 100.0000 mg | Freq: Once | INTRAMUSCULAR | Status: DC

## 2018-05-23 MED ORDER — MAGNESIUM HYDROXIDE 400 MG/5ML PO SUSP: 30.0000 mL | Freq: Every day | ORAL | Status: DC | PRN

## 2018-05-23 MED ORDER — LORAZEPAM 1 MG PO TABS
1.0000 mg | ORAL_TABLET | Freq: Four times a day (QID) | ORAL | Status: DC
  Administered 2018-05-23 (×3): 1 mg via ORAL
  Filled 2018-05-23 (×3): qty 1

## 2018-05-23 MED ORDER — LOPERAMIDE HCL 2 MG PO CAPS: 2.0000 mg | ORAL_CAPSULE | ORAL | Status: AC | PRN

## 2018-05-23 MED ORDER — METHOCARBAMOL 500 MG PO TABS: 500.0000 mg | ORAL_TABLET | Freq: Three times a day (TID) | ORAL | Status: DC | PRN

## 2018-05-23 MED ORDER — HYDROXYZINE HCL 25 MG PO TABS
25.0000 mg | ORAL_TABLET | Freq: Four times a day (QID) | ORAL | Status: AC | PRN
  Administered 2018-05-25: 25 mg via ORAL
  Filled 2018-05-23: qty 1
  Filled 2018-05-23: qty 10
  Filled 2018-05-23: qty 1

## 2018-05-23 NOTE — [facility] Suicide Risk Assessment (Signed)
[facility]   Nursing information obtained from:  Patient Demographic factors:  Male, Unemployed, Living alone, Low socioeconomic status Current Mental Status:  Suicidal ideation indicated by patient, Self-harm thoughts, Suicide plan, Intention to act on suicide plan Loss Factors:  Financial problems / change in socioeconomic status Historical Factors:  Prior suicide attempts, Family history of mental illness or substance abuse Risk Reduction Factors:  Sense of responsibility to family, Positive social support  Total Time spent with patient: 1 hour Principal Problem: Substance induced mood disorder (HCC) Diagnosis:   Patient Active Problem List   Diagnosis Date Noted  . Bipolar I disorder, most recent episode depressed, severe without psychotic features (HCC) [F31.4] 05/23/2018  . Alcohol-induced mood disorder (HCC) [F10.94] 02/14/2016  . Substance induced mood disorder (HCC) [F19.94] 01/30/2016  . Substance or medication-induced depressive disorder with onset during withdrawal [facility](HCC) [W29.562[F19.239, F19.24, F32.89] 02/14/2015  . Alcohol use disorder, severe, dependence (HCC) [F10.20] 02/13/2015  . Cocaine use disorder, severe, dependence (HCC) [F14.20] 02/13/2015  . Alcohol withdrawal syndrome without complication (HCC) [F10.230] 02/13/2015  . Opioid use disorder, severe, dependence (HCC) [F11.20] 02/13/2015  . Opioid withdrawal (HCC) [F11.23] 02/13/2015  . Opiate dependence, continuous (HCC) [F11.20] 10/13/2014  . 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 Eyelid abnormality [H02.9] 03/12/2012  . DIABETES MELLITUS, TYPE II [E11.9] 11/10/2010  . ABSCESS, GLUTEAL [L02.31, L03.317] 10/20/2010  . CERVICAL LYMPHADENOPATHY [R59.9] 05/16/2010  . 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 VIRAL URI [J06.9] 10/02/2008  . PLEURAL EFFUSION, LEFT [J90] 03/22/2008  . ALLERGIC RHINITIS [J30.9] 02/01/2008  . ASTHMA [J45.909] 02/01/2008   Subjective Data: See H&P for  details  Continued Clinical Symptoms:  Alcohol Use Disorder Identification Test Final Score (AUDIT): 32 The "Alcohol Use Disorders Identification Test", Guidelines for Use in Primary Care, Second Edition.  World Science writerHealth Organization [facility](WHO). Score between 0-7:  no or low risk or alcohol related problems. Score between 8-15:  moderate risk of alcohol related problems. Score between 16-19:  high risk of alcohol related problems. Score 20 or above:  warrants further diagnostic evaluation for alcohol dependence and treatment.   CLINICAL FACTORS:   Severe Anxiety and/or Agitation Bipolar Disorder:   Depressive phase Alcohol/Substance Abuse/Dependencies More than one psychiatric diagnosis Unstable or Poor Therapeutic Relationship Previous Psychiatric Diagnoses and Treatments    Psychiatric Specialty Exam: Physical Exam  Nursing note and vitals reviewed.   ROS- See H&P for details  Blood pressure 118/86, pulse 87, temperature 98.2 F (36.8 C), temperature source Oral, resp. rate 16, height 5\' 11"  (1.803 m), weight 59.4 kg (131 lb), SpO2 98 %.Body mass index is 18.27 kg/m.   COGNITIVE FEATURES THAT CONTRIBUTE TO RISK:  None    SUICIDE RISK:   Moderate:  Frequent suicidal ideation with limited intensity, and duration, some specificity in terms of plans, no associated intent, good self-control, limited dysphoria/symptomatology, some risk factors present, and identifiable protective factors, including available and accessible social support.  PLAN OF CARE: See H&P for details  I certify that inpatient services furnished can reasonably be expected to improve the patient's condition.   Micheal Likenshristopher T Loveah Like, MD 05/23/2018, 2:33 PM

## 2018-05-23 NOTE — Tx Team (Signed)
Interdisciplinary Treatment and Diagnostic Plan Update  05/23/2018 Time of Session: 0830AM Dalton [name] MRN: [id number]  Principal Diagnosis: MDD, recurrent, severe  Secondary Diagnoses: Active Problems:   Substance induced mood disorder (HCC)   Current Medications:  Current Facility-Administered Medications  Medication Dose Route Frequency Provider Last Rate Last Dose  . acetaminophen (TYLENOL) tablet 650 mg  650 mg Oral Q6H PRN Patriciaann Clan E, PA-C      . albuterol (PROVENTIL HFA;VENTOLIN HFA) 108 (90 Base) MCG/ACT inhaler 2 puff  2 puff Inhalation Q4H PRN Laverle Hobby, PA-C      . alum & mag hydroxide-simeth (MAALOX/MYLANTA) 200-200-20 MG/5ML suspension 30 mL  30 mL Oral Q4H PRN Patriciaann Clan E, PA-C      . cloNIDine (CATAPRES) tablet 0.1 mg  0.1 mg Oral QID Patriciaann Clan E, PA-C       Followed by  . [START ON 05/25/2018] cloNIDine (CATAPRES) tablet 0.1 mg  0.1 mg Oral BH-qamhs Simon, Spencer E, PA-C       Followed by  . [START ON 05/27/2018] cloNIDine (CATAPRES) tablet 0.1 mg  0.1 mg Oral QAC breakfast Laverle Hobby, PA-C      . dicyclomine (BENTYL) tablet 20 mg  20 mg Oral Q6H PRN Patriciaann Clan E, PA-C      . feeding supplement (ENSURE ENLIVE) (ENSURE ENLIVE) liquid 237 mL  237 mL Oral BID BM Cobos, Fernando A, MD      . hydrOXYzine (ATARAX/VISTARIL) tablet 25 mg  25 mg Oral Q6H PRN Patriciaann Clan E, PA-C      . loperamide (IMODIUM) capsule 2-4 mg  2-4 mg Oral PRN Laverle Hobby, PA-C      . LORazepam (ATIVAN) tablet 1 mg  1 mg Oral Q6H PRN Laverle Hobby, PA-C      . LORazepam (ATIVAN) tablet 1 mg  1 mg Oral QID Patriciaann Clan E, PA-C       Followed by  . [START ON 05/24/2018] LORazepam (ATIVAN) tablet 1 mg  1 mg Oral TID Laverle Hobby, PA-C       Followed by  . [START ON 05/25/2018] LORazepam (ATIVAN) tablet 1 mg  1 mg Oral BID Patriciaann Clan E, PA-C       Followed by  . [START ON 05/26/2018] LORazepam (ATIVAN) tablet 1 mg  1 mg Oral Daily Simon, Spencer E,  PA-C      . magnesium hydroxide (MILK OF MAGNESIA) suspension 30 mL  30 mL Oral Daily PRN Laverle Hobby, PA-C      . methocarbamol (ROBAXIN) tablet 500 mg  500 mg Oral Q8H PRN Laverle Hobby, PA-C      . multivitamin with minerals tablet 1 tablet  1 tablet Oral Daily Simon, Spencer E, PA-C      . naproxen (NAPROSYN) tablet 500 mg  500 mg Oral BID PRN Laverle Hobby, PA-C      . ondansetron (ZOFRAN-ODT) disintegrating tablet 4 mg  4 mg Oral Q6H PRN Laverle Hobby, PA-C      . thiamine (B-1) injection 100 mg  100 mg Intramuscular Once Laverle Hobby, PA-C      . [START ON 05/24/2018] thiamine (VITAMIN B-1) tablet 100 mg  100 mg Oral Daily Simon, Spencer E, PA-C      . traZODone (DESYREL) tablet 50 mg  50 mg Oral QHS,MR X 1 Simon, Spencer E, PA-C       PTA Medications: Medications Prior to Admission  Medication Sig Dispense Refill  Last Dose  . albuterol (PROVENTIL HFA;VENTOLIN HFA) 108 (90 BASE) MCG/ACT inhaler Inhale 2 puffs into the lungs every 4 (four) hours as needed for wheezing or shortness of breath. (Patient not taking: Reported on 05/22/2018)   Not Taking at Unknown time  . ARIPiprazole (ABILIFY) 5 MG tablet Take 1 tablet (5 mg total) by mouth daily. For mood control (Patient not taking: Reported on 02/14/2016) 30 tablet 0 Not Taking at Unknown time  . citalopram (CELEXA) 10 MG tablet Take 3 tablets (30 mg total) by mouth daily. For depression (Patient not taking: Reported on 05/22/2018) 30 tablet 0 Not Taking at Unknown time  . hydrOXYzine (ATARAX/VISTARIL) 50 MG tablet Take 1 tablet (50 mg total) by mouth every 6 (six) hours as needed for anxiety. (Patient not taking: Reported on 02/14/2016) 45 tablet 0 Not Taking at Unknown time  . ibuprofen (ADVIL,MOTRIN) 800 MG tablet Take 1 tablet (800 mg total) by mouth 3 (three) times daily. (Patient not taking: Reported on 05/22/2018) 21 tablet 0 Not Taking at Unknown time  . lamoTRIgine (LAMICTAL) 25 MG tablet Take 1 tablet (25 mg total) by  mouth daily. For mood stabilization (Patient not taking: Reported on 05/22/2018) 30 tablet 0 Not Taking at Unknown time  . methocarbamol (ROBAXIN) 500 MG tablet Take 1 tablet (500 mg total) by mouth 2 (two) times daily. (Patient not taking: Reported on 05/22/2018) 20 tablet 0 Not Taking at Unknown time  . nicotine (NICODERM CQ - DOSED IN MG/24 HOURS) 21 mg/24hr patch Place 1 patch (21 mg total) onto the skin daily. For smoking cessation (Patient not taking: Reported on 02/14/2016) 28 patch 0 Not Taking at Unknown time  . traZODone (DESYREL) 50 MG tablet Take 1 tablet (50 mg total) by mouth at bedtime and may repeat dose one time if needed. 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   Physician Treatment Plan for Primary Diagnosis: MDD, recurrent, severe   Medication Management: Evaluate patient's response, side effects, and tolerance of medication regimen.  Therapeutic Interventions: 1 to 1 sessions, Unit Group sessions and Medication administration.  Evaluation of Outcomes: Not Met  Physician Treatment Plan for Secondary Diagnosis: Active Problems:   Substance induced mood disorder ([place])   Medication Management: Evaluate patient's response, side effects, and tolerance of medication regimen.  Therapeutic Interventions: 1 to 1 sessions, Unit Group sessions and Medication administration.  Evaluation of Outcomes: Not Met   RN Treatment Plan for Primary Diagnosis: MDD, recurrent, severe  Long Term Goal(s): Knowledge of disease and therapeutic regimen to maintain  health will improve  Short Term Goals: Ability to remain free from injury will improve, Ability to verbalize frustration and anger appropriately will improve and Ability to disclose and discuss suicidal ideas  Medication Management: RN will administer medications as ordered by provider, will assess and evaluate patient's response and provide education to patient for prescribed medication. RN will report any adverse and/or side effects to prescribing provider.  Therapeutic Interventions: 1 on 1 counseling sessions, Psychoeducation, Medication administration, Evaluate responses to treatment, Monitor vital signs and CBGs as ordered, Perform/monitor CIWA, COWS, AIMS and Fall Risk screenings as ordered, Perform wound care treatments as ordered.  Evaluation of  Outcomes: Not Met   LCSW Treatment Plan for Primary Diagnosis: MDD, recurrent, severe  appropriate next level of care at discharge, Engage patient in therapeutic group addressing interpersonal concerns.  Short Term Goals: Engage patient in aftercare planning with referrals and resources, Increase social support, Facilitate patient progression through stages of change regarding substance use diagnoses and concerns and Identify triggers associated with mental health/substance abuse issues  Therapeutic Interventions: Assess for all discharge needs, 1 to 1 time with Social worker, Explore available resources and support systems, Assess for adequacy in community support network, Educate family and significant other(s) on suicide prevention, Complete Psychosocial Assessment, Interpersonal group therapy.  Evaluation of Outcomes: Not Met   Progress in Treatment: Attending groups: No. New to unit. Continuing to assess.  Participating in groups: No. Taking medication as prescribed: Yes. Toleration medication: Yes. Family/Significant other contact made: No, will contact:  family member if pt consents to collateral contact.  Patient understands  diagnosis: Yes. Discussing patient identified problems/goals with staff: Yes. Medical problems stabilized or resolved: Yes. Denies suicidal/homicidal ideation: Yes. Issues/concerns per patient self-inventory: No.  Other: n/a   New problem(s) identified: No, Describe:  n/a  New Short Term/Long Term Goal(s): detox, medication management for mood stabilization; elimination of SI thoughts; development of comprehensive mental wellness/sobriety plan.   Patient Goals:  "to get sober."   Discharge Plan or Barriers: CSW assessing for appropriate referrals. Pt is homeless and was last admitted to [facility] in 2016. [place] pamphlet, Mobile Crisis information, and AA/NA information provided to patient for additional community support and resources.   Reason for Continuation of Hospitalization: Anxiety Depression Medication stabilization Suicidal ideation Withdrawal symptoms  Estimated Length of Stay: Wed, 05/25/18  Attendees: Patient: Dalton [name] 05/23/2018 8:33 AM  Physician: Dr. Parke Poisson MD; Dr. Nancy Fetter MD 05/23/2018 8:33 AM  Nursing: Opal Sidles RN; [place] RN 05/23/2018 8:33 AM  RN Care Manager:x 05/23/2018 8:33 AM  Social Worker: Janice Norrie LCSW 05/23/2018 8:33 AM  Recreational Therapist: x 05/23/2018 8:33 AM  Other: Lindell Spar NP 05/23/2018 8:33 AM  Other:  05/23/2018 8:33 AM  Other: 05/23/2018 8:33 AM    Scribe for Treatment Team: Avelina Laine, LCSW 05/23/2018 8:33 AM

## 2018-05-23 NOTE — Progress Notes (Signed)
Psychoeducational Group Note  Date:  05/23/2018 Time:  2109  Group Topic/Focus:  Wrap-Up Group:   The focus of this group is to help patients review their daily goal of treatment and discuss progress on daily workbooks.  Participation Level: Did Not Attend  Participation Quality:  Not Applicable  Affect:  Not Applicable  Cognitive:  Not Applicable  Insight:  Not Applicable  Engagement in Group: Not Applicable  Additional Comments:  The patient did not attend the evening A.A.meeting.   Hazle CocaGOODMAN, Gracey Tolle S 05/23/2018, 9:09 PM

## 2018-05-23 NOTE — [facility] Counselor (Signed)
Adult Comprehensive Assessment  Patient ID: Dalton [name], male   DOB: [birth year], 32 y.o.   MRN: [id number]  Information Source: Information source: Patient   Current Stressors:  Educational / Learning stressors:  dropped out of [employer] 2 years ago Employment / Job issues: job loss one week ago.  Family Relationships: poor. Left mother's house a few days ago.  Financial / Lack of resources (include bankruptcy): owes IRS, court fees, fines Housing / Lack of housing: recently [facility] house 3 days ago. States he may be able to return "but I'd rather go to an [facility] or something."  Physical health (include injuries & life threatening diseases): no concerns Social relationships:single Substance abuse: crack cocaine, heroin, and alcohol. "I'm using a lot. Been using for a long time."  Bereavement / Loss: none noted  Living/Environment/Situation:  Living Arrangements: homeless Living conditions (as described by patient or guardian): unstable; unsafe. Homeless x 3 days  How long has patient lived in current situation?: 3 days. Prior to this pt was living with his mother What is atmosphere in current home: temporary; chaotic.   Family History:  Marital status: Single Does patient have children?: No  Childhood History:  By whom was/is the patient raised?: Mother Description of patient's relationship with caregiver when they were a child: Good w mother, absent father, relationship w stepfather has improved as patient aged Patient's description of current relationship with people who raised him/her: strained with mother due to pt's drug use.  Father in [place], has reached out to reconnect w patient approx 2 years ago after father was diagnosed w cancer, patient says he is developing relationsihip w father Does patient have siblings?: Yes Number of Siblings: 2 Description of patient's current relationship with siblings: Younger sister and stepbrother.  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 Lost job one week ago.  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 About to get dropped from insurance due to recent job loss. fines and court fees for legal charges here and in [facility]) Does patient have a representative payee or guardian?: No  Alcohol/Substance Abuse:  What has been your use of drugs/alcohol within the last 12 months?: "a lot of alcohol, crack and some heroin." pt reports use has been heavy and ongoing for years.  Alcohol/Substance Abuse Treatment Hx: Past Tx, Inpatient If yes, describe treatment: Was at [facility] for 28 day program after hospitalization at [facility] in 10/2014 and March 2016 and September 2016.  Has alcohol/substance abuse ever caused legal problems?: Yes (arrested for possession of small amount of marijuana a few years ago  in [facility]). Larceny charge-stealing from [place] recently.   Social Support  System:  Patient's Community Support System: none  Describe Community Support System: no identified  supports  Type of faith/religion: Christian How does patient's faith help to cope with current illness?: says thats the only way that he can "get over this addiction"  Leisure/Recreation:  Leisure and Hobbies: basketball, sports  Strengths/Needs:  What things does the patient do well?: talking w people about their problems, likes to listen and give wisdom to others In what areas does patient struggle / problems for patient: addiction  Discharge Plan:  Does patient have access to transportation?: Yes-bus Will patient be returning to same living situation after discharge?: unsure at this time. Pt looking into [facility].  Currently receiving community mental health services: No. If no, would patient like referral for services when discharged?:[facility] needed. [facility] list provided; halfway house list provided. Pt declined residential treatment referral.  Does patient have financial barriers related to discharge medications?: Yes Patient description of barriers related to discharge medications: No income; no insurance.          Summary/Recommendations:   Summary and Recommendations (to be completed by the evaluator): Patient is 32yo male who [place] homeless [place], [place] He presents to the hospital seeking treatment for depression, SI with plan, polysubstance abuse including cocaine, heroin, and alcohol, and for medication stabilization. Patient currently denies SI/HI/AVH. He reports job loss one week ago and left his mother's house 3 days ago. he has been homeless since then. Patient reports he is single with no kids. Recommendations for patient include: crisis stabilization, therapeutic milieu, encourage group attendance and particpation, medication management for detox/mood stabilization, and development of comprehensive mental wellness/sobriety plan. CSW assessing for appropriate referrals.   Rona RavensHeather S Keasha Malkiewicz LCSW 05/23/2018 11:01 AM

## 2018-05-23 NOTE — Progress Notes (Signed)
DAR NOTE: Pt present with flat affect and depressed mood in the unit. Pt has been isolating himself and has been bed most of the time. Pt denies physical pain, took all his meds as scheduled. As per self inventory, pt had a good night sleep, poor appetite, low  energy, and poor concentration. Pt rate depression at 10, hopeless ness at 10, and anxiety at 3. Pt's safety ensured with 15 minute and environmental checks. Pt currently denies SI/HI and A/V hallucinations. Pt verbally agrees to seek staff if SI/HI or A/VH occurs and to consult with staff before acting on these thoughts. Will continue POC.

## 2018-05-23 NOTE — Progress Notes (Signed)
Admission note:  Pt is a 32 year old AAM admitted to the services of Dr. Jola Babinskilary for treatment of substance abuse and depression with suicidal ideation to drive into traffic.  Pt does endorse passive SI during admission process but states that he will not harm himself while a patient in our facility.  Pt has been using cocaine,  Heroin, and alcohol with an emphasis on the cocaine and alcohol.  Pt appeared to be very sleepy during admission and kept putting his head down.  Pt explained that he has not slept in three days.  Pt was able to sign all paperwork and appropriately answer admission questions.  Pt requested a meal and was given a sandwich tray from cafeteria.  Pt was shown to room 303 and briefly oriented to unit due to sleepiness.

## 2018-05-23 NOTE — Progress Notes (Signed)
NUTRITION ASSESSMENT  Pt identified as at risk on the Malnutrition Screen Tool  INTERVENTION: 1. Supplements: Continue Ensure Enlive po BID, each supplement provides 350 kcal and 20 grams of protein  NUTRITION DIAGNOSIS: Unintentional weight loss related to sub-optimal intake as evidenced by pt report.   Goal: Pt to meet >/= 90% of their estimated nutrition needs.  Monitor:  PO intake  Assessment:  Pt admitted with depression and substance abuse (ETOH, cocaine, heroin). Pt currently homeless. Pt is underweight. Suspect poor quality diet PTA. Weights are stable. Will continue order for Ensure supplements.   Height: Ht Readings from Last 1 Encounters:  05/23/18 5\' 11"  (1.803 m)    Weight: Wt Readings from Last 1 Encounters:  05/23/18 131 lb (59.4 kg)    Weight Hx: Wt Readings from Last 10 Encounters:  05/23/18 131 lb (59.4 kg)  05/22/18 137 lb (62.1 kg)  03/25/17 148 lb (67.1 kg)  03/13/16 148 lb 1 oz (67.2 kg)  09/03/15 137 lb (62.1 kg)  02/12/15 140 lb (63.5 kg)  10/13/14 137 lb (62.1 kg)  10/13/14 140 lb (63.5 kg)  02/03/13 138 lb (62.6 kg)  01/31/13 135 lb (61.2 kg)    BMI:  Body mass index is 18.27 kg/m. Pt meets criteria for underweight based on current BMI.  Estimated Nutritional Needs: Kcal: 25-30 kcal/kg Protein: > 1 gram protein/kg Fluid: 1 ml/kcal  Diet Order:  Diet Order           Diet regular Room service appropriate? Yes; Fluid consistency: Thin  Diet effective now         Pt is also offered choice of unit snacks mid-morning and mid-afternoon.  Pt is eating as desired.   Lab results and medications reviewed.   Tilda FrancoLindsey Kazimir Hartnett, MS, RD, LDN [facility] Clinical Dietitian Pager: [phone] After Hours Pager: [phone]

## 2018-05-23 NOTE — H&P (Signed)
Psychiatric Admission Assessment Adult  Patient Identification: Dalton [name] MRN:  [id number] Date of Evaluation:  05/23/2018 Chief Complaint:  Bipolar 1 disorder cocaone use disorder severe Opiate use disorder severe ETOH use disorder severe Principal Diagnosis: Substance induced mood disorder ([place]) Diagnosis:   Patient Active Problem List   Diagnosis Date Noted  . Bipolar I disorder, most recent episode depressed, severe without psychotic features ([place]) [F31.4] 05/23/2018  . Alcohol-induced mood disorder ([place]) [F10.94] 02/14/2016  . Substance induced mood disorder ([place]) [F19.94] 01/30/2016  . Substance or medication-induced depressive disorder with onset during withdrawal [facility]) [H74.163, F19.24, F32.89] 02/14/2015  . Alcohol use disorder, severe, dependence ([place]) [F10.20] 02/13/2015  . Cocaine use disorder, severe, dependence ([place]) [F14.20] 02/13/2015  . Alcohol withdrawal syndrome without complication ([place]) [A45.364] 02/13/2015  . Opioid use disorder, severe, dependence ([place]) [F11.20] 02/13/2015  . Opioid withdrawal ([place]) [F11.23] 02/13/2015  . Opiate dependence, continuous ([place]) [F11.20] 10/13/2014  . 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 Eyelid abnormality [H02.9] 03/12/2012  . DIABETES MELLITUS, TYPE II [E11.9] 11/10/2010  . ABSCESS, GLUTEAL [L02.31, L03.317] 10/20/2010  . CERVICAL LYMPHADENOPATHY [R59.9] 05/16/2010  . 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 VIRAL URI [J06.9] 10/02/2008  . PLEURAL EFFUSION, LEFT [J90] 03/22/2008  . ALLERGIC RHINITIS [J30.9] 02/01/2008  . ASTHMA [J45.909] 02/01/2008   History of Present Illness:   Dalton [name] is a 32 y/o M with history of Bipolar disorder and multiple substance use disorders (cocaine, alcohol, opiate) who was admitted voluntarily from [facility] where he presented with worsening depression, worsening substance use of cocaine/alcohol/heroin and SI with plan to drive his vehicle into a tree or another vehicle. Pt  was started on alcohol withdrawal protocol and opiate withdrawal protocol. He was medically stabilized and then transferred to [facility] for additional treatment and stabilization.  Upon initial presentation, pt is significantly drowsy and falls asleep multiple times during interview which limits his ability to participate in the interview and limits his ability as a historian. Pt shares, "Things have been escalating too much - the drug use." Pt shares that for about the past 6 months he has been experiencing worsening use of crack (about $40-50 per day), heroin (about once per week via insufflation), and alcohol (drinking about 4 x 40 ounce beers daily). He reports stressor of homelessness where he had been staying with his mother until recently when she asked him to leave. Pt endorses depressive symptoms of poor sleep (with minimal sleep past 3 days), anhedonia, guilty feelings, low energy, poor concentration, and poor appetite. He endorses SI with plan to drive his vehicle into a tree or another vehicle. He denies HI/AH/VH. He endorses some irritability, but he otherwise denies current symptoms of mania; however, he reports previous episodes of distractibility, flight of ideas, irritability, and decreased need for sleep lasting up to 4 days. He denies symptoms of OCD and PTSD. He denies illicit substance use aside from the above history.  Discussed with patient about treatment options. He is currently taking no medications and has no outpatient provider. He reports previous trial of lamictal which was helpful. Discussed with patient that lamictal dose cannot be titrated quickly, so he would benefit from additional medication, and he agrees to trial of abilify in addition to lamictal. Pt declines for referral to substance use treatment, but instead he would like information on [employer] in the area. He will remain on opiate and alcohol withdrawal protocols. Pt was in agreement with the above plan, and he had no  further questions, comments, or concerns.  Associated Signs/Symptoms: Depression Symptoms:  depressed mood, anhedonia, insomnia, fatigue, feelings of worthlessness/guilt, difficulty concentrating, suicidal thoughts with specific plan, anxiety, decreased appetite, (Hypo) Manic Symptoms:  Distractibility, Impulsivity, Irritable Mood, Anxiety Symptoms:  NA Psychotic Symptoms:  NA PTSD Symptoms: NA Total Time spent with patient: 1 hour  Past Psychiatric History:  -Previous dx of MDD, multiple substance use disorders, and bipolar - About 4 previous inpatient stays with last to [facility] in 2016 - No current outpatient provider - 2 previous suicide attempts with last 1 year ago by "playing chicken with another car"   Is the patient at risk to self? Yes.    Has the patient been a risk to self in the past 6 months? Yes.    Has the patient been a risk to self within the distant past? Yes.    Is the patient a risk to others? Yes.    Has the patient been a risk to others in the past 6 months? Yes.    Has the patient been a risk to others within the distant past? Yes.     Prior Inpatient Therapy:   Prior Outpatient Therapy:    Alcohol Screening: 1. How often do you have a drink containing alcohol?: 4 or more times a week 2. How many drinks containing alcohol do you have on a typical day when you are drinking?: 5 or 6 3. How often do you have six or more drinks on one occasion?: Daily or almost daily AUDIT-C Score: 10 4. How often during the last year have you found that you were not able to stop drinking once you had started?: Weekly 5. How often during the last year have you failed to do what was normally expected from you becasue of drinking?: Daily or almost daily 6. How often during the last year have you needed a first drink in the morning to get yourself going after a heavy drinking session?: Daily or almost daily 7. How often during the last year have you had a feeling of guilt of  remorse after drinking?: Daily or almost daily 8. How often during the last year have you been unable to remember what happened the night before because you had been drinking?: Weekly 9. Have you or someone else been injured as a result of your drinking?: No 10. Has a relative or friend or a doctor or another health worker been concerned about your drinking or suggested you cut down?: Yes, during the last year Alcohol Use Disorder Identification Test Final Score (AUDIT): 32 Intervention/Follow-up: Alcohol Education, Brief Advice Substance Abuse History in the last 12 months:  Yes.   Consequences of Substance Abuse: Medical Consequences:  worsened mood symptoms Previous Psychotropic Medications: Yes  Psychological Evaluations: Yes  Past Medical History:  Past Medical History:  Diagnosis Date  . Allergy   . Asthma   . Bipolar 1 disorder ([place])   . Pulmonary tuberculosis     Past Surgical History:  Procedure Laterality Date  . TONSILLECTOMY     Family History: History reviewed. No pertinent family history. Family Psychiatric  History: denies family psychiatric history Tobacco Screening: Have you used any form of tobacco in the last 30 days? (Cigarettes, Smokeless Tobacco, Cigars, and/or Pipes): Yes Tobacco use, Select all that apply: 5 or more cigarettes per day Are you interested in Tobacco Cessation Medications?: No, patient refused Counseled patient on smoking cessation including recognizing danger situations, developing coping skills and basic information about quitting provided: Refused/Declined  practical counseling Social History: Pt was born and raised in [place]. He is homeless but he has been staying with his mother prior to hospitalization. He completed some college. He is a delivery driver. He has no spouse or children. He has previous legal history of "a gun charge." He denies trauma history. Social History   Substance and Sexual Activity  Alcohol Use Yes     Social  History   Substance and Sexual Activity  Drug Use Yes   Comment: former substance abuse    Additional Social History:                           Allergies:  No Known Allergies Lab Results:  Results for orders placed or performed during the hospital encounter of 05/22/18 (from the past 48 hour(s))  Rapid urine drug screen (hospital performed)     Status: Abnormal   Collection Time: 05/22/18  9:27 PM  Result Value Ref Range   Opiates POSITIVE (A) NONE DETECTED   Cocaine POSITIVE (A) NONE DETECTED   Benzodiazepines NONE DETECTED NONE DETECTED   Amphetamines NONE DETECTED NONE DETECTED   Tetrahydrocannabinol NONE DETECTED NONE DETECTED   Barbiturates (A) NONE DETECTED    Result not available. Reagent lot number recalled by manufacturer.    Comment: Performed at [facility] [place], [place] [address], [place], [place] [address]  Comprehensive metabolic panel     Status: Abnormal   Collection Time: 05/22/18  9:42 PM  Result Value Ref Range   Sodium 144 135 - 145 mmol/L   Potassium 3.7 3.5 - 5.1 mmol/L   Chloride 105 101 - 111 mmol/L   CO2 29 22 - 32 mmol/L   Glucose, Bld 126 (H) 65 - 99 mg/dL   BUN 10 6 - 20 mg/dL   Creatinine, Ser 1.19 0.61 - 1.24 mg/dL   Calcium 9.5 8.9 - 10.3 mg/dL   Total Protein 6.8 6.5 - 8.1 g/dL   Albumin 4.2 3.5 - 5.0 g/dL   AST 41 15 - 41 U/L   ALT 30 17 - 63 U/L   Alkaline Phosphatase 62 38 - 126 U/L   Total Bilirubin 0.4 0.3 - 1.2 mg/dL   GFR calc non Af Amer >60 >60 mL/min   GFR calc Af Amer >60 >60 mL/min    Comment: (NOTE) The eGFR has been calculated using the CKD EPI equation. This calculation has not been validated in all clinical situations. eGFR's persistently <60 mL/min signify possible Chronic Kidney Disease.    Anion gap 10 5 - 15    Comment: Performed at [facility], [place] [address] [place], [place] [address]  Ethanol     Status: Abnormal   Collection Time: 05/22/18  9:42 PM  Result Value Ref  Range   Alcohol, Ethyl (B) 158 (H) <10 mg/dL    Comment: (NOTE) Lowest detectable limit for serum alcohol is 10 mg/dL. For medical purposes only. Performed at [facility], [place] [address], [place], [place] [address]   cbc     Status: Abnormal   Collection Time: 05/22/18  9:42 PM  Result Value Ref Range   WBC 12.9 (H) 4.0 - 10.5 K/uL   RBC 5.53 4.22 - 5.81 MIL/uL   Hemoglobin 16.0 13.0 - 17.0 g/dL   HCT 45.1 39.0 - 52.0 %   MCV 81.6 78.0 - 100.0 fL   MCH 28.9 26.0 - 34.0 pg   MCHC 35.5 30.0 -  36.0 g/dL   RDW 13.8 11.5 - 15.5 %   Platelets 362 150 - 400 K/uL    Comment: Performed at [facility], [place] [address] [place], [place] [address]    Blood Alcohol level:  Lab Results  Component Value Date   ETH 158 (H) 05/22/2018   ETH 270 (H) 80/32/1224    Metabolic Disorder Labs:  Lab Results  Component Value Date   HGBA1C 5.4 06/08/2011   No results found for: PROLACTIN Lab Results  Component Value Date   CHOL 131 11/05/2010   TRIG 37.0 11/05/2010   HDL 37.60 (L) 11/05/2010   CHOLHDL 3 11/05/2010   VLDL 7.4 11/05/2010   LDLCALC 86 11/05/2010    Current Medications: Current Facility-Administered Medications  Medication Dose Route Frequency Provider Last Rate Last Dose  . acetaminophen (TYLENOL) tablet 650 mg  650 mg Oral Q6H PRN Patriciaann Clan E, PA-C      . albuterol (PROVENTIL HFA;VENTOLIN HFA) 108 (90 Base) MCG/ACT inhaler 2 puff  2 puff Inhalation Q4H PRN Laverle Hobby, PA-C      . alum & mag hydroxide-simeth (MAALOX/MYLANTA) 200-200-20 MG/5ML suspension 30 mL  30 mL Oral Q4H PRN Patriciaann Clan E, PA-C      . ARIPiprazole (ABILIFY) tablet 10 mg  10 mg Oral Daily Maris Berger T, MD      . cloNIDine (CATAPRES) tablet 0.1 mg  0.1 mg Oral QID Patriciaann Clan E, PA-C   0.1 mg at 05/23/18 1258   Followed by  . [START ON 05/25/2018] cloNIDine (CATAPRES) tablet 0.1 mg  0.1 mg Oral BH-qamhs Simon, Spencer E, PA-C        Followed by  . [START ON 05/27/2018] cloNIDine (CATAPRES) tablet 0.1 mg  0.1 mg Oral QAC breakfast Laverle Hobby, PA-C      . dicyclomine (BENTYL) tablet 20 mg  20 mg Oral Q6H PRN Patriciaann Clan E, PA-C      . feeding supplement (ENSURE ENLIVE) (ENSURE ENLIVE) liquid 237 mL  237 mL Oral BID BM Cobos, Fernando A, MD      . hydrOXYzine (ATARAX/VISTARIL) tablet 25 mg  25 mg Oral Q6H PRN Laverle Hobby, PA-C      . lamoTRIgine (LAMICTAL) tablet 25 mg  25 mg Oral Daily Maris Berger T, MD      . loperamide (IMODIUM) capsule 2-4 mg  2-4 mg Oral PRN Laverle Hobby, PA-C      . LORazepam (ATIVAN) tablet 1 mg  1 mg Oral Q6H PRN Laverle Hobby, PA-C      . LORazepam (ATIVAN) tablet 1 mg  1 mg Oral QID Patriciaann Clan E, PA-C   1 mg at 05/23/18 1258   Followed by  . [START ON 05/24/2018] LORazepam (ATIVAN) tablet 1 mg  1 mg Oral TID Laverle Hobby, PA-C       Followed by  . [START ON 05/25/2018] LORazepam (ATIVAN) tablet 1 mg  1 mg Oral BID Patriciaann Clan E, PA-C       Followed by  . [START ON 05/26/2018] LORazepam (ATIVAN) tablet 1 mg  1 mg Oral Daily Simon, Spencer E, PA-C      . magnesium hydroxide (MILK OF MAGNESIA) suspension 30 mL  30 mL Oral Daily PRN Patriciaann Clan E, PA-C      . methocarbamol (ROBAXIN) tablet 500 mg  500 mg Oral Q8H PRN Patriciaann Clan E, PA-C      . multivitamin with minerals tablet 1 tablet  1  tablet Oral Daily Laverle Hobby, PA-C   1 tablet at 05/23/18 7544  . naproxen (NAPROSYN) tablet 500 mg  500 mg Oral BID PRN Laverle Hobby, PA-C      . ondansetron (ZOFRAN-ODT) disintegrating tablet 4 mg  4 mg Oral Q6H PRN Laverle Hobby, PA-C      . thiamine (B-1) injection 100 mg  100 mg Intramuscular Once Laverle Hobby, PA-C      . [START ON 05/24/2018] thiamine (VITAMIN B-1) tablet 100 mg  100 mg Oral Daily Simon, Spencer E, PA-C      . traZODone (DESYREL) tablet 50 mg  50 mg Oral QHS,MR X 1 Simon, Spencer E, PA-C       PTA Medications: Medications Prior to  Admission  Medication Sig Dispense Refill Last Dose  . albuterol (PROVENTIL HFA;VENTOLIN HFA) 108 (90 BASE) MCG/ACT inhaler Inhale 2 puffs into the lungs every 4 (four) hours as needed for wheezing or shortness of breath. (Patient not taking: Reported on 05/22/2018)   Not Taking at Unknown time  . ARIPiprazole (ABILIFY) 5 MG tablet Take 1 tablet (5 mg total) by mouth daily. For mood control (Patient not taking: Reported on 02/14/2016) 30 tablet 0 Not Taking at Unknown time  . citalopram (CELEXA) 10 MG tablet Take 3 tablets (30 mg total) by mouth daily. For depression (Patient not taking: Reported on 05/22/2018) 30 tablet 0 Not Taking at Unknown time  . hydrOXYzine (ATARAX/VISTARIL) 50 MG tablet Take 1 tablet (50 mg total) by mouth every 6 (six) hours as needed for anxiety. (Patient not taking: Reported on 02/14/2016) 45 tablet 0 Not Taking at Unknown time  . ibuprofen (ADVIL,MOTRIN) 800 MG tablet Take 1 tablet (800 mg total) by mouth 3 (three) times daily. (Patient not taking: Reported on 05/22/2018) 21 tablet 0 Not Taking at Unknown time  . lamoTRIgine (LAMICTAL) 25 MG tablet Take 1 tablet (25 mg total) by mouth daily. For mood stabilization (Patient not taking: Reported on 05/22/2018) 30 tablet 0 Not Taking at Unknown time  . methocarbamol (ROBAXIN) 500 MG tablet Take 1 tablet (500 mg total) by mouth 2 (two) times daily. (Patient not taking: Reported on 05/22/2018) 20 tablet 0 Not Taking at Unknown time  . nicotine (NICODERM CQ - DOSED IN MG/24 HOURS) 21 mg/24hr patch Place 1 patch (21 mg total) onto the skin daily. For smoking cessation (Patient not taking: Reported on 02/14/2016) 28 patch 0 Not Taking at Unknown time  . traZODone (DESYREL) 50 MG tablet Take 1 tablet (50 mg total) by mouth at bedtime and may repeat dose one time if needed. For sleep (Patient not taking: Reported on 02/14/2016) 60 tablet 0 Not Taking at Unknown time    Musculoskeletal: Strength & Muscle Tone: within normal limits Gait &  Station: normal Patient leans: N/A  Psychiatric Specialty Exam: Physical Exam  Nursing note and vitals reviewed.   Review of Systems  Constitutional: Negative for chills and fever.  Respiratory: Negative for cough and shortness of breath.   Cardiovascular: Negative for chest pain.  Gastrointestinal: Negative for abdominal pain, heartburn, nausea and vomiting.  Psychiatric/Behavioral: Positive for depression, substance abuse and suicidal ideas. Negative for hallucinations. The patient is nervous/anxious and has insomnia.     Blood pressure 118/86, pulse 87, temperature 98.2 F (36.8 C), temperature source Oral, resp. rate 16, height 5' 11"  (1.803 m), weight 59.4 kg (131 lb), SpO2 98 %.Body mass index is 18.27 kg/m.  General Appearance: Casual and Fairly Groomed  Eye Contact:  Good  Speech:  Clear and Coherent and Normal Rate  Volume:  Normal  Mood:  Depressed and Irritable  Affect:  Blunt, Congruent and Constricted  Thought Process:  Coherent and Goal Directed  Orientation:  Full (Time, Place, and Person)  Thought Content:  Logical  Suicidal Thoughts:  Yes.  with intent/plan  Homicidal Thoughts:  No  Memory:  Immediate;   Fair Recent;   Fair Remote;   Fair  Judgement:  Poor  Insight:  Lacking  Psychomotor Activity:  Normal  Concentration:  Concentration: Fair  Recall:  [employer] of Knowledge:  Fair  Language:  Fair  Akathisia:  No  Handed:    AIMS (if indicated):     Assets:  Desire for Improvement Resilience  ADL's:  Intact  Cognition:  WNL  Sleep:  Number of Hours: 4.75    Treatment Plan Summary: Daily contact with patient to assess and evaluate symptoms and progress in treatment and Medication management  Observation Level/Precautions:  15 minute checks  Laboratory:  CBC Chemistry Profile HbAIC UDS UA  Psychotherapy:  Encourage participation in groups and therapeutic milieu   Medications:  Start lamictal 26m po qDay. Start abilify 147mpo qDay. Continue  CIWA with ativan. Continue COWS with clonidine. Continue vistaril 2561mo q6h prn anxiety. Continue trazodone 69m75m qhs prn isomnia.   Consultations:    Discharge Concerns:    Estimated LOS: 5-7 days  Other:     Physician Treatment Plan for Primary Diagnosis: Substance induced mood disorder (HCC)[place] Term Goal(s): Improvement in symptoms so as ready for discharge  Short Term Goals: Ability to identify changes in lifestyle to reduce recurrence of condition will improve  Physician Treatment Plan for Secondary Diagnosis: Principal Problem:   Substance induced mood disorder (HCC)[place] Problems:   Alcohol use disorder, severe, dependence (HCC)[place] use disorder, severe, dependence (HCC)[place] disorder, most recent episode depressed, severe without psychotic features (HCC)[place] Term Goal(s): Improvement in symptoms so as ready for discharge  Short Term Goals: Ability to identify triggers associated with substance abuse/mental health issues will improve  I certify that inpatient services furnished can reasonably be expected to improve the patient's condition.    ChriPennelope Bracken 6/17/20192:04 PM

## 2018-05-23 NOTE — BHH Group Notes (Signed)
LCSW Group Therapy Note   05/23/2018 1:15pm   Type of Therapy and Topic:  Group Therapy:  Overcoming Obstacles   Participation Level:  Did Not Attend--pt invited. Chose to remain in bed.    Description of Group:    In this group patients will be encouraged to explore what they see as obstacles to their own wellness and recovery. They will be guided to discuss their thoughts, feelings, and behaviors related to these obstacles. The group will process together ways to cope with barriers, with attention given to specific choices patients can make. Each patient will be challenged to identify changes they are motivated to make in order to overcome their obstacles. This group will be process-oriented, with patients participating in exploration of their own experiences as well as giving and receiving support and challenge from other group members.   Therapeutic Goals: 1. Patient will identify personal and current obstacles as they relate to admission. 2. Patient will identify barriers that currently interfere with their wellness or overcoming obstacles.  3. Patient will identify feelings, thought process and behaviors related to these barriers. 4. Patient will identify two changes they are willing to make to overcome these obstacles:      Summary of Patient Progress   x   Therapeutic Modalities:   Cognitive Behavioral Therapy Solution Focused Therapy Motivational Interviewing Relapse Prevention Therapy  Rona RavensHeather S Joua Bake, LCSW 05/23/2018 9:41 AM

## 2018-05-23 NOTE — Tx Team (Signed)
Initial Treatment Plan 05/23/2018 1:02 AM Dalton Grapesorey D Stuller RUE:[id number]:[id number]    PATIENT STRESSORS: Financial difficulties Marital or family conflict Substance abuse   PATIENT STRENGTHS: Average or above average intelligence Communication skills Motivation for treatment/growth Supportive family/friends   PATIENT IDENTIFIED PROBLEMS: Depression  Substance abuse  Suicidal Ideation    "Get sober"             DISCHARGE CRITERIA:  Adequate post-discharge living arrangements Improved stabilization in mood, thinking, and/or behavior Motivation to continue treatment in a less acute level of care Need for constant or close observation no longer present Verbal commitment to aftercare and medication compliance Withdrawal symptoms are absent or subacute and managed without 24-hour nursing intervention  PRELIMINARY DISCHARGE PLAN: Attend 12-step recovery group Outpatient therapy Placement in alternative living arrangements  PATIENT/FAMILY INVOLVEMENT: This treatment plan has been presented to and reviewed with the patient, Dalton [name].  The patient and family have been given the opportunity to ask questions and make suggestions.  Juliann ParesBowman, Milana Salay Elizabeth, RN 05/23/2018, 1:02 AM

## 2018-05-23 NOTE — Progress Notes (Signed)
Recreation Therapy Notes  Date: 6.17.19 Time: 0930 Location: [address] Dayroom  Group Topic: Stress Management  Goal Area(s) Addresses:  Patient will verbalize importance of using healthy stress management.  Patient will identify positive emotions associated with healthy stress management.   Intervention: Stress Management  Activity :  Choice Meditation.  LRT played a meditation on choice.  Patients were to follow along as meditation was played to engage in activity.  Education:  Stress Management, Discharge Planning.   Education Outcome: Acknowledges edcuation/In group clarification offered/Needs additional education  Clinical Observations/Feedback: Pt did not attend group.    Clara Herbison, LRT/CTRS         Sherrika Weakland A 05/23/2018 11:54 AM 

## 2018-05-24 DIAGNOSIS — F1020 Alcohol dependence, uncomplicated: Secondary | ICD-10-CM

## 2018-05-24 DIAGNOSIS — F314 Bipolar disorder, current episode depressed, severe, without psychotic features: Principal | ICD-10-CM

## 2018-05-24 DIAGNOSIS — F1994 Other psychoactive substance use, unspecified with psychoactive substance-induced mood disorder: Secondary | ICD-10-CM

## 2018-05-24 DIAGNOSIS — F1420 Cocaine dependence, uncomplicated: Secondary | ICD-10-CM

## 2018-05-24 NOTE — Progress Notes (Signed)
DAR NOTE: Patient presents with anxious affect and depressed mood.  Denies pain, auditory and visual hallucinations.  Rates depression at 5, hopelessness at 5, and anxiety at 5.  Maintained on routine safety checks.  Medications given as prescribed.  Support and encouragement offered as needed. Will continue to monitor.

## 2018-05-24 NOTE — Progress Notes (Signed)
Pt has been in bed asleep all evening.  He did awake long enough for the shift assessment, and states he is tired and just wants to sleep.  He denies SI/HI/AVH.  He took his evening meds except the Trazodone, stating that he did not need it.  He is unsure what his discharge plans are.  Conversation was short and minimal with Clinical research associatewriter.  Support and encouragement offered.  Discharge plans are in process.  Safety maintained with q15 minute checks.

## 2018-05-24 NOTE — Progress Notes (Signed)
[facility] MD Progress Note  05/24/2018 3:31 PM Dalton [name]  MRN:  [id number]  Subjective: Dalton [name] reports, "I was using crack, Cocaine, heroin & alcohol. There are no withdrawal symptoms & I never have withdrawal symptoms. I'm depressed, sad & ashamed. I know I need treatment, but, I have a family, a car payment & all that. I can't just drop & go some where for treatment, I've got to work the 2 jobs I have. I need Lamictal, the last time I was on Lamictal, I did really well. That was in 2016".   Dalton [name] is a 32 y/o M with history of Bipolar disorder and multiple substance use disorders (cocaine, alcohol, opiate) who was admitted voluntarily from [facility] where he presented with worsening depression, worsening substance use of cocaine/alcohol/heroin and SI with plan to drive his vehicle into a tree or another vehicle. Pt was started on alcohol withdrawal protocol and opiate withdrawal protocol. He was medically stabilized and then transferred to [facility] for additional treatment and stabilization. Upon initial presentation, pt is significantly drowsy and falls asleep multiple times during interview which limits his ability to participate in the interview and limits his ability as a historian. Pt shares, "Things have been escalating too much - the drug use."  Dalton [name] is seen, chart reviewed. The chart findings discussed with the treatment team. He presents alert & oriented x 4. He is not making any eye contact. His affect is flat. He says he is feeling very depressed & sad because of his drug use. He says he has been through the [facility] treatment [facility] in the past, did well, then relapsed. Says this time around, he does no longer have the funds or time to do it again. Besides, he says he has to work the jobs that he currently has as he has bills to pay, car payment to make & a family to support. He requested to be re-started on Lamictal as it has helped him in the past. Although feeling  depressed & disappointed in himself, Dalton [name] currently denies any SIHI, AVH, delusional thoughts or paranoia. He does not appear to be responding to any internal stimuli. He denies any substance withdrawal symptoms at this time. Dalton [name] is not attending any group sessions. Says he is not interested at this time. Staff continues to support & encourage.  Principal Problem: Substance induced mood disorder ([place])  Diagnosis:   Patient Active Problem List   Diagnosis Date Noted  . Bipolar I disorder, most recent episode depressed, severe without psychotic features ([place]) [F31.4] 05/23/2018  . Alcohol-induced mood disorder ([place]) [F10.94] 02/14/2016  . Substance induced mood disorder ([place]) [F19.94] 01/30/2016  . Substance or medication-induced depressive disorder with onset during withdrawal [facility]) [O37.290, F19.24, F32.89] 02/14/2015  . Alcohol use disorder, severe, dependence ([place]) [F10.20] 02/13/2015  . Cocaine use disorder, severe, dependence ([place]) [F14.20] 02/13/2015  . Alcohol withdrawal syndrome without complication ([place]) [S11.155] 02/13/2015  . Opioid use disorder, severe, dependence ([place]) [F11.20] 02/13/2015  . Opioid withdrawal ([place]) [F11.23] 02/13/2015  . Opiate dependence, continuous ([place]) [F11.20] 10/13/2014  . 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 Eyelid abnormality [H02.9] 03/12/2012  . DIABETES MELLITUS, TYPE II [E11.9] 11/10/2010  . ABSCESS, GLUTEAL [L02.31, L03.317] 10/20/2010  . CERVICAL LYMPHADENOPATHY [R59.9] 05/16/2010  . 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 VIRAL URI [J06.9] 10/02/2008  . PLEURAL EFFUSION, LEFT [J90] 03/22/2008  . ALLERGIC RHINITIS [J30.9] 02/01/2008  . ASTHMA [J45.909] 02/01/2008   Total Time spent with  patient: 1 hour  Past Psychiatric History: See H&P.  Past Medical History:  Past Medical History:  Diagnosis Date  . Allergy   . Asthma   . Bipolar 1 disorder ([place])   . Pulmonary tuberculosis     Past Surgical History:  Procedure  Laterality Date  . TONSILLECTOMY     Family History: History reviewed. No pertinent family history.  Family Psychiatric  History: See H&P  Social History:  Social History   Substance and Sexual Activity  Alcohol Use Yes     Social History   Substance and Sexual Activity  Drug Use Yes   Comment: former substance abuse    Social History   Socioeconomic History  . Marital status: Single    Spouse name: Not on file  . Number of children: Not on file  . Years of education: Not on file  . Highest education level: Not on file  Occupational History  . Not on file  Social Needs  . Financial resource strain: Not on file  . Food insecurity:    Worry: Not on file    Inability: Not on file  . Transportation needs:    Medical: Not on file    Non-medical: Not on file  Tobacco Use  . Smoking status: Current Every Day Smoker    Packs/day: 1.00    Types: Cigarettes  . Smokeless tobacco: Never Used  . Tobacco comment: 4 ciggs qd  Substance and Sexual Activity  . Alcohol use: Yes  . Drug use: Yes    Comment: former substance abuse  . Sexual activity: Yes    Birth control/protection: Condom  Lifestyle  . Physical activity:    Days per week: Not on file    Minutes per session: Not on file  . Stress: Not on file  Relationships  . Social connections:    Talks on phone: Not on file    Gets together: Not on file    Attends religious service: Not on file    Active member of club or organization: Not on file    Attends meetings of clubs or organizations: Not on file    Relationship status: Not on file  Other Topics Concern  . Not on file  Social History Narrative  . Not on file   Additional Social History:   Sleep: Fair  Appetite:  Fair  Current Medications: Current Facility-Administered Medications  Medication Dose Route Frequency Provider Last Rate Last Dose  . acetaminophen (TYLENOL) tablet 650 mg  650 mg Oral Q6H PRN Patriciaann Clan E, PA-C      . albuterol  (PROVENTIL HFA;VENTOLIN HFA) 108 (90 Base) MCG/ACT inhaler 2 puff  2 puff Inhalation Q4H PRN Laverle Hobby, PA-C      . alum & mag hydroxide-simeth (MAALOX/MYLANTA) 200-200-20 MG/5ML suspension 30 mL  30 mL Oral Q4H PRN Patriciaann Clan E, PA-C      . ARIPiprazole (ABILIFY) tablet 10 mg  10 mg Oral Daily Pennelope Bracken, MD   10 mg at 05/24/18 0801  . cloNIDine (CATAPRES) tablet 0.1 mg  0.1 mg Oral QID Patriciaann Clan E, PA-C   0.1 mg at 05/24/18 1253   Followed by  . [START ON 05/25/2018] cloNIDine (CATAPRES) tablet 0.1 mg  0.1 mg Oral BH-qamhs Simon, Spencer E, PA-C       Followed by  . [START ON 05/27/2018] cloNIDine (CATAPRES) tablet 0.1 mg  0.1 mg Oral QAC breakfast Patriciaann Clan E, PA-C      . dicyclomine (BENTYL)  tablet 20 mg  20 mg Oral Q6H PRN Patriciaann Clan E, PA-C      . feeding supplement (ENSURE ENLIVE) (ENSURE ENLIVE) liquid 237 mL  237 mL Oral BID BM Cobos, Fernando A, MD   237 mL at 05/24/18 1445  . hydrOXYzine (ATARAX/VISTARIL) tablet 25 mg  25 mg Oral Q6H PRN Laverle Hobby, PA-C      . lamoTRIgine (LAMICTAL) tablet 25 mg  25 mg Oral Daily Pennelope Bracken, MD   25 mg at 05/24/18 0801  . loperamide (IMODIUM) capsule 2-4 mg  2-4 mg Oral PRN Laverle Hobby, PA-C      . LORazepam (ATIVAN) tablet 1 mg  1 mg Oral Q6H PRN Laverle Hobby, PA-C      . LORazepam (ATIVAN) tablet 1 mg  1 mg Oral TID Patriciaann Clan E, PA-C   1 mg at 05/24/18 1253   Followed by  . [START ON 05/25/2018] LORazepam (ATIVAN) tablet 1 mg  1 mg Oral BID Patriciaann Clan E, PA-C       Followed by  . [START ON 05/26/2018] LORazepam (ATIVAN) tablet 1 mg  1 mg Oral Daily Simon, Spencer E, PA-C      . magnesium hydroxide (MILK OF MAGNESIA) suspension 30 mL  30 mL Oral Daily PRN Laverle Hobby, PA-C      . methocarbamol (ROBAXIN) tablet 500 mg  500 mg Oral Q8H PRN Laverle Hobby, PA-C      . multivitamin with minerals tablet 1 tablet  1 tablet Oral Daily Laverle Hobby, PA-C   1 tablet at  05/24/18 1610  . naproxen (NAPROSYN) tablet 500 mg  500 mg Oral BID PRN Laverle Hobby, PA-C      . ondansetron (ZOFRAN-ODT) disintegrating tablet 4 mg  4 mg Oral Q6H PRN Laverle Hobby, PA-C      . thiamine (B-1) injection 100 mg  100 mg Intramuscular Once Patriciaann Clan E, PA-C      . thiamine (VITAMIN B-1) tablet 100 mg  100 mg Oral Daily Patriciaann Clan E, PA-C   100 mg at 05/24/18 0801  . traZODone (DESYREL) tablet 50 mg  50 mg Oral QHS,MR X 1 Laverle Hobby, PA-C       Lab Results:  Results for orders placed or performed during the hospital encounter of 05/22/18 (from the past 48 hour(s))  Rapid urine drug screen (hospital performed)     Status: Abnormal   Collection Time: 05/22/18  9:27 PM  Result Value Ref Range   Opiates POSITIVE (A) NONE DETECTED   Cocaine POSITIVE (A) NONE DETECTED   Benzodiazepines NONE DETECTED NONE DETECTED   Amphetamines NONE DETECTED NONE DETECTED   Tetrahydrocannabinol NONE DETECTED NONE DETECTED   Barbiturates (A) NONE DETECTED    Result not available. Reagent lot number recalled by manufacturer.    Comment: Performed at [facility], [place] [address] [place], [place] [address]  Comprehensive metabolic panel     Status: Abnormal   Collection Time: 05/22/18  9:42 PM  Result Value Ref Range   Sodium 144 135 - 145 mmol/L   Potassium 3.7 3.5 - 5.1 mmol/L   Chloride 105 101 - 111 mmol/L   CO2 29 22 - 32 mmol/L   Glucose, Bld 126 (H) 65 - 99 mg/dL   BUN 10 6 - 20 mg/dL   Creatinine, Ser 1.19 0.61 - 1.24 mg/dL   Calcium 9.5 8.9 - 10.3 mg/dL   Total Protein 6.8 6.5 -  8.1 g/dL   Albumin 4.2 3.5 - 5.0 g/dL   AST 41 15 - 41 U/L   ALT 30 17 - 63 U/L   Alkaline Phosphatase 62 38 - 126 U/L   Total Bilirubin 0.4 0.3 - 1.2 mg/dL   GFR calc non Af Amer >60 >60 mL/min   GFR calc Af Amer >60 >60 mL/min    Comment: (NOTE) The eGFR has been calculated using the CKD EPI equation. This calculation has not been validated in all clinical  situations. eGFR's persistently <60 mL/min signify possible Chronic Kidney Disease.    Anion gap 10 5 - 15    Comment: Performed at [facility], [place] [address], [place], [place] [address]  Ethanol     Status: Abnormal   Collection Time: 05/22/18  9:42 PM  Result Value Ref Range   Alcohol, Ethyl (B) 158 (H) <10 mg/dL    Comment: (NOTE) Lowest detectable limit for serum alcohol is 10 mg/dL. For medical purposes only. Performed at [facility], [place] [address] [place], [place] [address]   cbc     Status: Abnormal   Collection Time: 05/22/18  9:42 PM  Result Value Ref Range   WBC 12.9 (H) 4.0 - 10.5 K/uL   RBC 5.53 4.22 - 5.81 MIL/uL   Hemoglobin 16.0 13.0 - 17.0 g/dL   HCT 45.1 39.0 - 52.0 %   MCV 81.6 78.0 - 100.0 fL   MCH 28.9 26.0 - 34.0 pg   MCHC 35.5 30.0 - 36.0 g/dL   RDW 13.8 11.5 - 15.5 %   Platelets 362 150 - 400 K/uL    Comment: Performed at [facility], [place] [address], [place], [place] [address]   Blood Alcohol level:  Lab Results  Component Value Date   ETH 158 (H) 05/22/2018   ETH 270 (H) 06/00/4599   Metabolic Disorder Labs: Lab Results  Component Value Date   HGBA1C 5.4 06/08/2011   No results found for: PROLACTIN Lab Results  Component Value Date   CHOL 131 11/05/2010   TRIG 37.0 11/05/2010   HDL 37.60 (L) 11/05/2010   CHOLHDL 3 11/05/2010   VLDL 7.4 11/05/2010   LDLCALC 86 11/05/2010    Physical Findings: AIMS: Facial and Oral Movements Muscles of Facial Expression: None, normal Lips and Perioral Area: None, normal Jaw: None, normal Tongue: None, normal,Extremity Movements Upper (arms, wrists, hands, fingers): None, normal Lower (legs, knees, ankles, toes): None, normal, Trunk Movements Neck, shoulders, hips: None, normal, Overall Severity Severity of abnormal movements (highest score from questions above): None, normal Incapacitation due to abnormal movements: None,  normal Patient's awareness of abnormal movements (rate only patient's report): No Awareness, Dental Status Current problems with teeth and/or dentures?: No Does patient usually wear dentures?: No  CIWA:  CIWA-Ar Total: 0 COWS:  COWS Total Score: 2  Musculoskeletal: Strength & Muscle Tone: within normal limits Gait & Station: normal Patient leans: N/A  Psychiatric Specialty Exam: Physical Exam  Nursing note and vitals reviewed.   Review of Systems  Psychiatric/Behavioral: Positive for depression and substance abuse. Negative for suicidal ideas.    Blood pressure 95/72, pulse 70, temperature 98.7 F (37.1 C), temperature source Oral, resp. rate 16, height 5' 11"  (1.803 m), weight 59.4 kg (131 lb), SpO2 98 %.Body mass index is 18.27 kg/m.  General Appearance: Casual and Fairly Groomed  Eye Contact:  Good  Speech:  Clear and Coherent and Normal Rate  Volume:  Normal  Mood:  Depressed and Irritable  Affect:  Blunt, Congruent and Constricted  Thought Process:  Coherent and Goal Directed  Orientation:  Full (Time, Place, and Person)  Thought Content:  Logical  Suicidal Thoughts:  Yes.  with intent/plan  Homicidal Thoughts:  No  Memory:  Immediate;   Fair Recent;   Fair Remote;   Fair  Judgement:  Poor  Insight:  Lacking  Psychomotor Activity:  Normal  Concentration:  Concentration: Fair  Recall:  [employer] of Knowledge:  Fair  Language:  Fair  Akathisia:  No  Handed:    AIMS (if indicated):     Assets:  Desire for Improvement Resilience  ADL's:  Intact  Cognition:  WNL  Sleep:  Number of Hours: 4.75     Treatment Plan Summary: Daily contact with patient to assess and evaluate symptoms and progress in treatment.  -Continue inpatient hospitalization.  -Will continue today 05/24/2018 plan as below except where it is noted.  Drug withdrawal symptoms.     - Discontinue the Ativan detox protocols, patient denies any withdrawal symptoms.     - Disontinue the Clonidine  detox protocols, patient denies any withdrawal symptoms.  Mood control.     - Increased Abilify from 10 mg po daily to Abilify 15 mg po daily.  Mood stabilization.     - Initiated Lamictal 25 mg po daily.  Insomnia.     - Continue Trazodone 50 mg po Q hs, may repeat x 1.  -Patient to attend & participate in the group sessions & AA/NA meetings.  - Discharge disposition plan on going.  Lindell Spar, NP, PMHNP, FNP-BC. 05/24/2018, 3:31 PM

## 2018-05-24 NOTE — [facility] Suicide Risk Assessment (Signed)
[facility] INPATIENT:  Family/Significant Other Suicide Prevention Education  Suicide Prevention Education:  Patient Refusal for Family/Significant Other Suicide Prevention Education: The patient Dalton GrapesCorey D Korson has refused to provide written consent for family/significant other to be provided Family/Significant Other Suicide Prevention Education during admission and/or prior to discharge.  Physician notified.  SPE completed with pt, as pt refused to consent to family contact. SPI pamphlet provided to pt and pt was encouraged to share information with support network, ask questions, and talk about any concerns relating to SPE. Pt denies access to guns/firearms and verbalized understanding of information provided. Mobile Crisis information also provided to pt.   Rona RavensHeather S Elyjah Hazan LCSW 05/24/2018, 1:59 PM

## 2018-05-24 NOTE — Progress Notes (Signed)
Pt has been in his room in bed all evening as he was yesterday evening.  He had a visitor earlier during visitation which seem to be a good visit for him.  He denies SI/HI/AVH.  He denies any withdrawal symptoms at this time.  He states he does not want the sleep aid ordered for the night.  He was encouraged to make his needs known to staff.  Support and encouragement offered.  Discharge plans are in process.  Safety maintained with q15 minute checks.

## 2018-05-24 NOTE — [facility] Group Notes (Signed)
[facility]24[facility]2018 1:15pm  Type of Therapy: Mental Health Association Presentation  Participation Level: Invited. Pt chose to remain in bed.    Rona RavensHeather S Oswin Griffith, LCSW 05/24/2018 2:44 PM

## 2018-05-25 MED ORDER — LAMOTRIGINE 25 MG PO TABS
25.0000 mg | ORAL_TABLET | Freq: Once | ORAL | Status: AC
  Administered 2018-05-25: 25 mg via ORAL
  Filled 2018-05-25 (×2): qty 1

## 2018-05-25 MED ORDER — NICOTINE 21 MG/24HR TD PT24
21.0000 mg | MEDICATED_PATCH | Freq: Every day | TRANSDERMAL | Status: DC
Start: 2018-05-25 — End: 2018-05-26
  Administered 2018-05-25: 21 mg via TRANSDERMAL
  Filled 2018-05-25 (×4): qty 1

## 2018-05-25 MED ORDER — LAMOTRIGINE 25 MG PO TABS
50.0000 mg | ORAL_TABLET | Freq: Every day | ORAL | Status: DC
  Administered 2018-05-26: 50 mg via ORAL
  Filled 2018-05-25: qty 14
  Filled 2018-05-25 (×2): qty 2

## 2018-05-25 NOTE — Progress Notes (Signed)
CSW checked in with pt this afternoon. Pt reports that he has been feeling much better today. He reports that he attended groups today and plans to discharge with his sister and [facility] made for Monday [facility]/24). Pt plans to stay with his sister until his mom gets back into town. Per Dr. [place] MD, pt is scheduled for discharge on Thursday, pending a good night tonight. Pt is satisfied with medication regimen and has been provided with AA/NA schedules for [place]/MHAG pamphlet.  Deniqua Perry S. Alan RipperHolloway, MSW, LCSW Clinical Social Worker 05/25/2018 3:25 PM

## 2018-05-25 NOTE — Progress Notes (Signed)
[facility] MD Progress Note  05/25/2018 11:00 AM Dalton [name]  MRN:  [id number]  Subjective: Dalton [name] reports, "I'm doing alright. I'm taking the medicines, have not noticed if they are helping or not. I'm still feeling the same way, depressed & sad. I need the Lamictal increased a little bit. I missed my daughter's birthday yesterday. She turned 13. She is my only child. I thought having this child will help me quit drugs, I guess I was wrong".  Dalton [name] is a 32 y/o M with history of Bipolar disorder and multiple substance use disorders (cocaine, alcohol, opiate) who was admitted voluntarily from [facility] where he presented with worsening depression, worsening substance use of cocaine/alcohol/heroin and SI with plan to drive his vehicle into a tree or another vehicle. Pt was started on alcohol withdrawal protocol and opiate withdrawal protocol. He was medically stabilized and then transferred to [facility] for additional treatment and stabilization. Upon initial presentation, pt is significantly drowsy and falls asleep multiple times during interview which limits his ability to participate in the interview and limits his ability as a historian. Pt shares, "Things have been escalating too much - the drug use."  Dalton [name] is seen, chart reviewed. The chart findings discussed with the treatment team. He presents alert & oriented x 4. He is still not making any eye contact. His affect remians flat, depressed. He remains isolative in his room, no interaction with other patients. He says he is feeling very depressed & sad because he missed his daughter's birth day yesterday. But, was able to call on the phone to wish her a happy birthday. He requested for his Lamictal to be increased. Although feeling depressed & disappointed in himself, Dalton [name] currently denies any SIHI, AVH, delusional thoughts or paranoia. He does not appear to be responding to any internal stimuli. He denies any substance withdrawal symptoms at this time.  Dalton [name] is still not attending any group sessions. Says he is not interested at this time. His Lamictal is increased from 25 mg to 50 mg daily. He received a dose of 25 mg of Lamictal once today to make today's dose 50 mg. Staff continues to support & encourage.  Principal Problem: Substance induced mood disorder (HCC)  Diagnosis:   Patient Active Problem List   Diagnosis Date Noted  . Bipolar I disorder, most recent episode depressed, severe without psychotic features (HCC) [F31.4] 05/23/2018  . Alcohol-induced mood disorder (HCC) [F10.94] 02/14/2016  . Substance induced mood disorder (HCC) [F19.94] 01/30/2016  . Substance or medication-induced depressive disorder with onset during withdrawal [facility]) [W09.811, F19.24, F32.89] 02/14/2015  . Alcohol use disorder, severe, dependence (HCC) [F10.20] 02/13/2015  . Cocaine use disorder, severe, dependence (HCC) [F14.20] 02/13/2015  . Alcohol withdrawal syndrome without complication (HCC) [F10.230] 02/13/2015  . Opioid use disorder, severe, dependence (HCC) [F11.20] 02/13/2015  . Opioid withdrawal (HCC) [F11.23] 02/13/2015  . Opiate dependence, continuous (HCC) [F11.20] 10/13/2014  . 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 Eyelid abnormality [H02.9] 03/12/2012  . DIABETES MELLITUS, TYPE II [E11.9] 11/10/2010  . ABSCESS, GLUTEAL [L02.31, L03.317] 10/20/2010  . CERVICAL LYMPHADENOPATHY [R59.9] 05/16/2010  . 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 VIRAL URI [J06.9] 10/02/2008  . PLEURAL EFFUSION, LEFT [J90] 03/22/2008  . ALLERGIC RHINITIS [J30.9] 02/01/2008  . ASTHMA [J45.909] 02/01/2008   Total Time spent with patient: 1 hour  Past Psychiatric History: See H&P.  Past Medical History:  Past Medical History:  Diagnosis Date  . Allergy   . Asthma   . Bipolar  1 disorder (HCC)   . Pulmonary tuberculosis     Past Surgical History:  Procedure Laterality Date  . TONSILLECTOMY     Family History: History reviewed. No pertinent family  history.  Family Psychiatric  History: See H&P  Social History:  Social History   Substance and Sexual Activity  Alcohol Use Yes     Social History   Substance and Sexual Activity  Drug Use Yes   Comment: former substance abuse    Social History   Socioeconomic History  . Marital status: Single    Spouse name: Not on file  . Number of children: Not on file  . Years of education: Not on file  . Highest education level: Not on file  Occupational History  . Not on file  Social Needs  . Financial resource strain: Not on file  . Food insecurity:    Worry: Not on file    Inability: Not on file  . Transportation needs:    Medical: Not on file    Non-medical: Not on file  Tobacco Use  . Smoking status: Current Every Day Smoker    Packs/day: 1.00    Types: Cigarettes  . Smokeless tobacco: Never Used  . Tobacco comment: 4 ciggs qd  Substance and Sexual Activity  . Alcohol use: Yes  . Drug use: Yes    Comment: former substance abuse  . Sexual activity: Yes    Birth control/protection: Condom  Lifestyle  . Physical activity:    Days per week: Not on file    Minutes per session: Not on file  . Stress: Not on file  Relationships  . Social connections:    Talks on phone: Not on file    Gets together: Not on file    Attends religious service: Not on file    Active member of club or organization: Not on file    Attends meetings of clubs or organizations: Not on file    Relationship status: Not on file  Other Topics Concern  . Not on file  Social History Narrative  . Not on file   Additional Social History:   Sleep: Fair  Appetite:  Fair  Current Medications: Current Facility-Administered Medications  Medication Dose Route Frequency Provider Last Rate Last Dose  . acetaminophen (TYLENOL) tablet 650 mg  650 mg Oral Q6H PRN Donell Sievert E, PA-C      . albuterol (PROVENTIL HFA;VENTOLIN HFA) 108 (90 Base) MCG/ACT inhaler 2 puff  2 puff Inhalation Q4H PRN Kerry Hough, PA-C      . alum & mag hydroxide-simeth (MAALOX/MYLANTA) 200-200-20 MG/5ML suspension 30 mL  30 mL Oral Q4H PRN Donell Sievert E, PA-C      . ARIPiprazole (ABILIFY) tablet 10 mg  10 mg Oral Daily Micheal Likens, MD   10 mg at 05/25/18 1007  . feeding supplement (ENSURE ENLIVE) (ENSURE ENLIVE) liquid 237 mL  237 mL Oral BID BM Cobos, Rockey Situ, MD   237 mL at 05/25/18 1009  . hydrOXYzine (ATARAX/VISTARIL) tablet 25 mg  25 mg Oral Q6H PRN Kerry Hough, PA-C      . lamoTRIgine (LAMICTAL) tablet 25 mg  25 mg Oral Once Armandina Stammer I, NP      . Melene Muller ON 05/26/2018] lamoTRIgine (LAMICTAL) tablet 50 mg  50 mg Oral Daily Nwoko, Agnes I, NP      . loperamide (IMODIUM) capsule 2-4 mg  2-4 mg Oral PRN Kerry Hough, PA-C      .  magnesium hydroxide (MILK OF MAGNESIA) suspension 30 mL  30 mL Oral Daily PRN Kerry HoughSimon, Spencer E, PA-C      . multivitamin with minerals tablet 1 tablet  1 tablet Oral Daily Kerry HoughSimon, Spencer E, PA-C   1 tablet at 05/25/18 1008  . naproxen (NAPROSYN) tablet 500 mg  500 mg Oral BID PRN Kerry HoughSimon, Spencer E, PA-C      . ondansetron (ZOFRAN-ODT) disintegrating tablet 4 mg  4 mg Oral Q6H PRN Kerry HoughSimon, Spencer E, PA-C      . traZODone (DESYREL) tablet 50 mg  50 mg Oral QHS,MR X 1 Simon, Mena GoesSpencer E, PA-C       Lab Results:  No results found for this or any previous visit (from the past 48 hour(s)). Blood Alcohol level:  Lab Results  Component Value Date   ETH 158 (H) 05/22/2018   ETH 270 (H) 02/14/2016   Metabolic Disorder Labs: Lab Results  Component Value Date   HGBA1C 5.4 06/08/2011   No results found for: PROLACTIN Lab Results  Component Value Date   CHOL 131 11/05/2010   TRIG 37.0 11/05/2010   HDL 37.60 (L) 11/05/2010   CHOLHDL 3 11/05/2010   VLDL 7.4 11/05/2010   LDLCALC 86 11/05/2010   Physical Findings: AIMS: Facial and Oral Movements Muscles of Facial Expression: None, normal Lips and Perioral Area: None, normal Jaw: None, normal Tongue: None,  normal,Extremity Movements Upper (arms, wrists, hands, fingers): None, normal Lower (legs, knees, ankles, toes): None, normal, Trunk Movements Neck, shoulders, hips: None, normal, Overall Severity Severity of abnormal movements (highest score from questions above): None, normal Incapacitation due to abnormal movements: None, normal Patient's awareness of abnormal movements (rate only patient's report): No Awareness, Dental Status Current problems with teeth and/or dentures?: No Does patient usually wear dentures?: No  CIWA:  CIWA-Ar Total: 0 COWS:  COWS Total Score: 8  Musculoskeletal: Strength & Muscle Tone: within normal limits Gait & Station: normal Patient leans: N/A  Psychiatric Specialty Exam: Physical Exam  Nursing note and vitals reviewed.   Review of Systems  Psychiatric/Behavioral: Positive for depression and substance abuse. Negative for suicidal ideas.    Blood pressure 90/79, pulse (!) 109, temperature 98.1 F (36.7 C), temperature source Oral, resp. rate 18, height 5\' 11"  (1.803 m), weight 59.4 kg (131 lb), SpO2 98 %.Body mass index is 18.27 kg/m.  General Appearance: Casual and Fairly Groomed  Eye Contact:  Good  Speech:  Clear and Coherent and Normal Rate  Volume:  Normal  Mood:  Depressed and Irritable  Affect:  Blunt, Congruent and Constricted  Thought Process:  Coherent and Goal Directed  Orientation:  Full (Time, Place, and Person)  Thought Content:  Logical  Suicidal Thoughts:  Yes.  with intent/plan  Homicidal Thoughts:  No  Memory:  Immediate;   Fair Recent;   Fair Remote;   Fair  Judgement:  Poor  Insight:  Lacking  Psychomotor Activity:  Normal  Concentration:  Concentration: Fair  Recall:  [employer]Fair  Fund of Knowledge:  Fair  Language:  Fair  Akathisia:  No  Handed:    AIMS (if indicated):     Assets:  Desire for Improvement Resilience  ADL's:  Intact  Cognition:  WNL  Sleep:  Number of Hours: 4.75     Treatment Plan Summary: Daily  contact with patient to assess and evaluate symptoms and progress in treatment.  -Continue inpatient hospitalization.  -Will continue today 05/25/2018 plan as below except where it is noted.  Drug withdrawal  symptoms.     - Discontinued the Ativan detox protocols, patient denies any withdrawal symptoms.     - Disontinued the Clonidine detox protocols, patient denies any withdrawal symptoms.  Mood control.     - Continue Abilify 15 mg po Q daily.  Mood stabilization.     - Increased Lamictal from 25 mg po daily to Lamictal 50 mg po daily starting today. (Administered Lamictal 25 mg once today).  Insomnia.     - Continue Trazodone 50 mg po Q hs, may repeat x 1.  -Patient to attend & participate in the group sessions & AA/NA meetings.  - Discharge disposition plan on going.  Armandina Stammer, NP, PMHNP, FNP-BC. 05/25/2018, 11:00 AMPatient ID: Joylene Grapes, male   DOB: 07-21-1986, 32 y.o.   MRN: [id number]

## 2018-05-25 NOTE — Progress Notes (Signed)
Recreation Therapy Notes  Date: 6.19.19 Time: 1000 Location: 300 Hall Dayroom  Group Topic: Stress Management  Goal Area(s) Addresses:  Patient will verbalize importance of using healthy stress management.  Patient will identify positive emotions associated with healthy stress management.   Intervention: Stress Management  Activity : Guided Imagery.  LRT introduced the stress management technique of guided imagery.  LRT read a script that lead patients through the process of taking a mental vacation.  Patients were to follow along as LRT read script to engage in activity.  Education:  Stress Management, Discharge Planning.   Education Outcome: Acknowledges edcuation/In group clarification offered/Needs additional education  Clinical Observations/Feedback: Pt did not attend group.    Caroll RancherMarjette Chelle Cayton, LRT/CTRS         Caroll RancherLindsay, Burdette Gergely A 05/25/2018 11:49 AM

## 2018-05-25 NOTE — Progress Notes (Signed)
D    Pt complained of sore neck and asked for a muscle relaxer or a pain patch    He said he was being discharged tomorrow and he feels ready to go   He is pleasant and cooperative   His behavior is appropriate and he interacts well with others A   Verbal support given   Medications administered and effectiveness monitored   Given naproxen and cold pack for his neck pain   Q 15 min checks R    Pt is safe at present time and said the cold pack was effective

## 2018-05-25 NOTE — Progress Notes (Signed)
Patient ID: Dalton [name], male   DOB: 1986-06-23, 32 y.o.   MRN: [id number]   Pt currently presents with a flat affect and guarded behavior. Pt remains in bed this morning, reports ongoing sedation. Pt then awakens in the afternoon and requests to be discharged by Friday. Pt requests nicotine patch. Pt reports that "I get up out of bed when I am ready to go. I have been here for 4 days, I'm ready."  Pt provided with medications per providers orders. Pt's labs and vitals were monitored throughout the night. Pt given a 1:1 about emotional and mental status. Pt supported and encouraged to express concerns and questions. Pt educated on medications.  Pt's safety ensured with 15 minute and environmental checks. Pt currently denies SI/HI and A/V hallucinations. Pt verbally agrees to seek staff if SI/HI or A/VH occurs and to consult with staff before acting on any harmful thoughts. Will continue POC.

## 2018-05-25 NOTE — [facility] Group Notes (Signed)
LCSW Group Therapy Note 05/25/2018 2:11 PM  Type of Therapy/Topic: Group Therapy: Feelings about Diagnosis  Participation Level: Active   Description of Group:  This group will allow patients to explore their thoughts and feelings about diagnoses they have received. Patients will be guided to explore their level of understanding and acceptance of these diagnoses. Facilitator will encourage patients to process their thoughts and feelings about the reactions of others to their diagnosis and will guide patients in identifying ways to discuss their diagnosis with significant others in their lives. This group will be process-oriented, with patients participating in exploration of their own experiences, giving and receiving support, and processing challenge from other group members.  Therapeutic Goals: 1. Patient will demonstrate understanding of diagnosis as evidenced by identifying two or more symptoms of the disorder 2. Patient will be able to express two feelings regarding the diagnosis 3. Patient will demonstrate their ability to communicate their needs through discussion and/or role play  Summary of Patient Progress:  Dalton [name] was engaged and participated throughout the group session. Dalton [name] reports that he was confused and frustrated when he learned he had a mental health diagnosis. Dalton [name] reports that he always knew something was wrong, however he did not feel comfortable seeking help due to the stigma of having a mental illness in his community.     Therapeutic Modalities:  Cognitive Behavioral Therapy Brief Therapy Feelings Identification    Anthea Udovich Catalina AntiguaWilliams LCSWA Clinical Social Worker

## 2018-05-26 MED ORDER — LAMOTRIGINE 25 MG PO TABS: 50.0000 mg | ORAL_TABLET | Freq: Every day | ORAL | 0 refills | Status: AC

## 2018-05-26 MED ORDER — TRAZODONE HCL 50 MG PO TABS: 50.0000 mg | ORAL_TABLET | Freq: Every evening | ORAL | 0 refills | Status: AC | PRN

## 2018-05-26 MED ORDER — TRAZODONE HCL 50 MG PO TABS
50.0000 mg | ORAL_TABLET | Freq: Every evening | ORAL | Status: DC | PRN
  Filled 2018-05-26 (×2): qty 7

## 2018-05-26 MED ORDER — ALBUTEROL SULFATE HFA 108 (90 BASE) MCG/ACT IN AERS: 2.0000 | INHALATION_SPRAY | RESPIRATORY_TRACT | Status: AC | PRN

## 2018-05-26 MED ORDER — TRAZODONE HCL 50 MG PO TABS
50.0000 mg | ORAL_TABLET | Freq: Every evening | ORAL | Status: DC | PRN
  Filled 2018-05-26: qty 7

## 2018-05-26 MED ORDER — HYDROXYZINE HCL 25 MG PO TABS: 25.0000 mg | ORAL_TABLET | Freq: Four times a day (QID) | ORAL | 0 refills | Status: AC | PRN

## 2018-05-26 MED ORDER — NICOTINE 21 MG/24HR TD PT24: 21.0000 mg | MEDICATED_PATCH | Freq: Every day | TRANSDERMAL | 0 refills | Status: AC

## 2018-05-26 MED ORDER — ARIPIPRAZOLE 10 MG PO TABS: 10.0000 mg | ORAL_TABLET | Freq: Every day | ORAL | 0 refills | Status: AC

## 2018-05-26 NOTE — Progress Notes (Signed)
  [facility] :  Will you be returning to the same living situation after discharge:  Yes,  home with sister until mother gets back in to town At discharge, do you have transportation home?: Yes,  sister Do you have the ability to pay for your medications: Yes,  mental health  Release of information consent forms completed and submitted to medical records by CSW.  Patient to Follow up at: Follow-up Information    [facility] on 05/31/2018.   Specialty:  Behavioral Health Why:  Appointment is Tuesday, 05/31/18 at 8:00am.  Contact information: [address] [place] [address] [phone]           Next level of care provider has access to [facility] Link:no  Safety Planning and Suicide Prevention discussed: Yes,  SPE completed with pt; pt declined to consent to collateral contact.   Have you used any form of tobacco in the last 30 days? (Cigarettes, Smokeless Tobacco, Cigars, and/or Pipes): Yes  Has patient been referred to the Quitline?: Patient refused referral  Patient has been referred for addiction treatment: Yes  Rona RavensHeather S Advit Trethewey, LCSW 05/26/2018, 8:38 AM

## 2018-05-26 NOTE — Progress Notes (Signed)
Pt discharged to lobby. Pt was stable and appreciative at that time. All papers, samples and prescriptions were given and valuables returned. Verbal understanding expressed. Denies SI/HI and A/VH. Pt given opportunity to express concerns and ask questions.  

## 2018-05-26 NOTE — Discharge Summary (Addendum)
Physician Discharge Summary Note  Patient:  Dalton [name] is an 32 y.o., male MRN:  [id number] DOB:  1986/04/15 Patient phone:  [phone] (home)  Patient address:   [address] [place] [address],   Total Time spent with patient: Greater than 30 minutes  Date of Admission:  05/23/2018  Date of Discharge: 05/26/18  Reason for Admission: Worsening depression, worsening substance use of cocaine/alcohol/heroin and SI with plan to drive his vehicle into a tree or another vehicle.   Principal Problem: Substance induced mood disorder [facility])  Discharge Diagnoses: Patient Active Problem List   Diagnosis Date Noted  . Bipolar I disorder, most recent episode depressed, severe without psychotic features (HCC) [F31.4] 05/23/2018  . Alcohol-induced mood disorder (HCC) [F10.94] 02/14/2016  . Substance induced mood disorder (HCC) [F19.94] 01/30/2016  . Substance or medication-induced depressive disorder with onset during withdrawal [facility]) [O13.086, F19.24, F32.89] 02/14/2015  . Alcohol use disorder, severe, dependence (HCC) [F10.20] 02/13/2015  . Cocaine use disorder, severe, dependence (HCC) [F14.20] 02/13/2015  . Alcohol withdrawal syndrome without complication (HCC) [F10.230] 02/13/2015  . Opioid use disorder, severe, dependence (HCC) [F11.20] 02/13/2015  . Opioid withdrawal (HCC) [F11.23] 02/13/2015  . Opiate dependence, continuous (HCC) [F11.20] 10/13/2014  . 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 Eyelid abnormality [H02.9] 03/12/2012  . DIABETES MELLITUS, TYPE II [E11.9] 11/10/2010  . ABSCESS, GLUTEAL [L02.31, L03.317] 10/20/2010  . CERVICAL LYMPHADENOPATHY [R59.9] 05/16/2010  . 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 VIRAL URI [J06.9] 10/02/2008  . PLEURAL EFFUSION, LEFT [J90] 03/22/2008  . ALLERGIC RHINITIS [J30.9] 02/01/2008  . ASTHMA [J45.909] 02/01/2008   Musculoskeletal: Strength & Muscle Tone: within normal limits Gait & Station: normal Patient  leans: N/A  Psychiatric Specialty Exam: Physical Exam  Constitutional: He appears well-developed.  HENT:  Head: Normocephalic.  Eyes: Pupils are equal, round, and reactive to light.  Neck: Normal range of motion.  Cardiovascular: Normal rate.  Respiratory: Effort normal.  GI: Soft.  Genitourinary:  Genitourinary Comments: Deferred  Musculoskeletal: Normal range of motion.  Neurological: He is alert.  Skin: Skin is warm.  Psychiatric: His speech is normal and behavior is normal. Judgment and thought content normal. His mood appears not anxious. His affect is not angry, not blunt, not labile and not inappropriate. Cognition and memory are normal. He does not exhibit a depressed mood.    Review of Systems  Constitutional: Negative.   HENT: Negative.   Eyes: Negative.   Respiratory: Negative.   Cardiovascular: Negative.   Gastrointestinal: Negative.   Genitourinary: Negative.   Musculoskeletal: Negative.   Skin: Negative.   Neurological: Negative.   Endo/Heme/Allergies: Negative.   Psychiatric/Behavioral: Positive for depression (Stabilized with medication prior to dicharge) and substance abuse (Hx. Polysubstance dependence ). Negative for hallucinations, memory loss and suicidal ideas. The patient has insomnia (Stable). The patient is not nervous/anxious.     Blood pressure 111/70, pulse 75, temperature 98.4 F (36.9 C), temperature source Oral, resp. rate 18, height 5\' 11"  (1.803 m), weight 59.4 kg (131 lb), SpO2 98 %.Body mass index is 18.27 kg/m.  See MD's SRA   Past Medical History:  Past Medical History:  Diagnosis Date  . Allergy   . Asthma   . Bipolar 1 disorder (HCC)   . Pulmonary tuberculosis     Past Surgical History:  Procedure Laterality Date  . TONSILLECTOMY     Family History: History reviewed. No pertinent family history.  Social History:  Social History   Substance and Sexual  Activity  Alcohol Use Yes     Social History   Substance and Sexual  Activity  Drug Use Yes   Comment: former substance abuse    Social History   Socioeconomic History  . Marital status: Single    Spouse name: Not on file  . Number of children: Not on file  . Years of education: Not on file  . Highest education level: Not on file  Occupational History  . Not on file  Social Needs  . Financial resource strain: Not on file  . Food insecurity:    Worry: Not on file    Inability: Not on file  . Transportation needs:    Medical: Not on file    Non-medical: Not on file  Tobacco Use  . Smoking status: Current Every Day Smoker    Packs/day: 1.00    Types: Cigarettes  . Smokeless tobacco: Never Used  . Tobacco comment: 4 ciggs qd  Substance and Sexual Activity  . Alcohol use: Yes  . Drug use: Yes    Comment: former substance abuse  . Sexual activity: Yes    Birth control/protection: Condom  Lifestyle  . Physical activity:    Days per week: Not on file    Minutes per session: Not on file  . Stress: Not on file  Relationships  . Social connections:    Talks on phone: Not on file    Gets together: Not on file    Attends religious service: Not on file    Active member of club or organization: Not on file    Attends meetings of clubs or organizations: Not on file    Relationship status: Not on file  Other Topics Concern  . Not on file  Social History Narrative  . Not on file   Risk to Self:    Risk to Others: No Prior Inpatient Therapy: Yes Prior Outpatient Therapy: Yes  Level of Care:  OP  Hospital Course: (Per Md's discharge SRA): Dalton [name] is a 32 y/o M with history of Bipolar disorder and multiple substance use disorders (cocaine, alcohol, opiate) who was admitted voluntarily from [facility] where he presented with worsening depression, worsening substance use of cocaine/alcohol/heroin and SI with plan to drive his vehicle into a tree or another vehicle. Pt was started on alcohol withdrawal protocol and opiate withdrawal protocol. He  was medically stabilized and then [facility] and stabilization. Pt was started on combination of abilify and lamictal, and he reported improvement of his presenting symptoms.  Besides the use of Abilify 10 mg for mood control, Lamictal 50 mg for mood stabilization, COWS & CIWA protocols for Opioid/Alcohol detoxifications treatments, Dalton [name] also was medicated & discharged on Hydroxyzine 25 mg prn for anxiety, Nicoderm patch 21 mg for smoking cessation & trazodone 50 mg prn for insomnia. He was enrolled in the group counseling sessions & AA/NA meetings being held on this unit to learn coping skills to help him maintain sobriety as well as mood stability upon discharge, Dalton [name] declined to attend or participate. He presented other significant medical issues that required treatment or monitoring (athma). He was resumed & discharged on his prn albuterol inhaler. He tolerated his treatment regimen without any adverse effects or reactions reported. Romin's symptoms responded well to his treatment regimen. His mood is stabilized & no substance withdrawal symptoms present upon discharge.  Today upon his discharge evaluation, pt shares, "I'm doing alright." He is sleeping well. His appetite is good. He denies  other physical complaints. He denies SI/HI/AH/VH. He is tolerating his medications without difficulty or side effect. He is in agreement to continue his current regimen without changes and he plans to [facility] up [facility]. He expresses interest in intensive outpatient treatment for substance use treatment. He was able to engage in safety planning including plan to return to [facility] or contact emergency services if he feels unable to maintain his own safety or the safety of others. Pt had no further questions, comments, or concerns.  Discharge Vitals:   Blood pressure 111/70, pulse 75, temperature 98.4 F (36.9 C), temperature source Oral, resp. rate 18, height 5\' 11"  (1.803 m), weight  59.4 kg (131 lb), SpO2 98 %. Body mass index is 18.27 kg/m. Lab Results:   No results found for this or any previous visit (from the past 72 hour(s)).  Physical Findings: AIMS: Facial and Oral Movements Muscles of Facial Expression: None, normal Lips and Perioral Area: None, normal Jaw: None, normal Tongue: None, normal,Extremity Movements Upper (arms, wrists, hands, fingers): None, normal Lower (legs, knees, ankles, toes): None, normal, Trunk Movements Neck, shoulders, hips: None, normal, Overall Severity Severity of abnormal movements (highest score from questions above): None, normal Incapacitation due to abnormal movements: None, normal Patient's awareness of abnormal movements (rate only patient's report): No Awareness, Dental Status Current problems with teeth and/or dentures?: No Does patient usually wear dentures?: No  CIWA:  CIWA-Ar Total: 1 COWS:  COWS Total Score: 8   See Psychiatric Specialty Exam and Suicide Risk Assessment completed by Attending Physician prior to discharge.  Discharge destination:  Home  Is patient on multiple antipsychotic therapies at discharge:  No   Has Patient had three or more failed trials of antipsychotic monotherapy by history:  No  Recommended Plan for Multiple Antipsychotic Therapies: NA  Allergies as of 05/26/2018   No Known Allergies     Medication List    STOP taking these medications   citalopram 10 MG tablet Commonly known as:  CELEXA   ibuprofen 800 MG tablet Commonly known as:  ADVIL,MOTRIN   methocarbamol 500 MG tablet Commonly known as:  ROBAXIN     TAKE these medications     Indication  albuterol 108 (90 Base) MCG/ACT inhaler Commonly known as:  PROVENTIL HFA;VENTOLIN HFA Inhale 2 puffs into the lungs every 4 (four) hours as needed for wheezing or shortness of breath.  Indication:  Asthma   ARIPiprazole 10 MG tablet Commonly known as:  ABILIFY Take 1 tablet (10 mg total) by mouth daily. For mood  control Start taking on:  05/27/2018 What changed:    medication strength  how much to take  Indication:  Mood control   hydrOXYzine 25 MG tablet Commonly known as:  ATARAX/VISTARIL Take 1 tablet (25 mg total) by mouth every 6 (six) hours as needed (anxiety/agitation). What changed:    medication strength  how much to take  reasons to take this  Indication:  Feeling Anxious   lamoTRIgine 25 MG tablet Commonly known as:  LAMICTAL Take 2 tablets (50 mg total) by mouth daily. For mood stabilization Start taking on:  05/27/2018 What changed:  how much to take  Indication:  Mood stabilization   nicotine 21 mg/24hr patch Commonly known as:  NICODERM CQ - dosed in mg/24 hours Place 1 patch (21 mg total) onto the skin daily. (May buy from over the counter): For smoking cessation Start taking on:  05/27/2018 What changed:  additional instructions  Indication:  Nicotine Addiction  traZODone 50 MG tablet Commonly known as:  DESYREL Take 1 tablet (50 mg total) by mouth at bedtime as needed for sleep. What changed:    when to take this  reasons to take this  additional instructions  Indication:  Trouble Sleeping      Follow-up Information    [facility]. Go on 05/31/2018.   Specialty:  Behavioral Health Why:  Appointment is Tuesday, 05/31/18 at 8:00am.  Contact information: [address] [place] [address] [phone]          Follow-up recommendations: Activity:  As tolerated Diet: As recommended by your primary care doctor. Keep all scheduled follow-up appointments as recommended.    Comments: Patient is instructed prior to discharge to: Take all medications as prescribed by his/her mental healthcare provider. Report any adverse effects and or reactions from the medicines to his/her outpatient provider promptly. Patient has been instructed & cautioned: To not engage in alcohol and or illegal drug use while on prescription medicines. In the event of worsening  symptoms, patient is instructed to call the crisis hotline, 911 and or go to the nearest ED for appropriate evaluation and treatment of symptoms. To follow-up with his/her primary care provider for your other medical issues, concerns and or health care needs.    Signed: Sanjuana Kava, PMHNP, FNP-C 05/26/2018, 8:53 AM   Patient seen, Suicide Assessment Completed.  Disposition Plan Reviewed

## 2018-05-26 NOTE — [facility] Suicide Risk Assessment (Signed)
[facility] Discharge Suicide Risk Assessment   Principal Problem: Substance induced mood disorder [facility]) Discharge Diagnoses:  Patient Active Problem List   Diagnosis Date Noted  . Bipolar I disorder, most recent episode depressed, severe without psychotic features (HCC) [F31.4] 05/23/2018  . Alcohol-induced mood disorder (HCC) [F10.94] 02/14/2016  . Substance induced mood disorder (HCC) [F19.94] 01/30/2016  . Substance or medication-induced depressive disorder with onset during withdrawal [facility]) [Z61.096, F19.24, F32.89] 02/14/2015  . Alcohol use disorder, severe, dependence (HCC) [F10.20] 02/13/2015  . Cocaine use disorder, severe, dependence (HCC) [F14.20] 02/13/2015  . Alcohol withdrawal syndrome without complication (HCC) [F10.230] 02/13/2015  . Opioid use disorder, severe, dependence (HCC) [F11.20] 02/13/2015  . Opioid withdrawal (HCC) [F11.23] 02/13/2015  . Opiate dependence, continuous (HCC) [F11.20] 10/13/2014  . 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 Eyelid abnormality [H02.9] 03/12/2012  . DIABETES MELLITUS, TYPE II [E11.9] 11/10/2010  . ABSCESS, GLUTEAL [L02.31, L03.317] 10/20/2010  . CERVICAL LYMPHADENOPATHY [R59.9] 05/16/2010  . 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 VIRAL URI [J06.9] 10/02/2008  . PLEURAL EFFUSION, LEFT [J90] 03/22/2008  . ALLERGIC RHINITIS [J30.9] 02/01/2008  . ASTHMA [J45.909] 02/01/2008    Total Time spent with patient: 30 minutes  Musculoskeletal: Strength & Muscle Tone: within normal limits Gait & Station: normal Patient leans: N/A  Psychiatric Specialty Exam: Review of Systems  Constitutional: Negative for chills and fever.  Respiratory: Negative for cough and shortness of breath.   Cardiovascular: Negative for chest pain.  Gastrointestinal: Negative for abdominal pain, heartburn, nausea and vomiting.  Psychiatric/Behavioral: Negative for depression, hallucinations and suicidal ideas. The patient is not nervous/anxious and does not  have insomnia.     Blood pressure 111/70, pulse 75, temperature 98.4 F (36.9 C), temperature source Oral, resp. rate 18, height 5\' 11"  (1.803 m), weight 59.4 kg (131 lb), SpO2 98 %.Body mass index is 18.27 kg/m.  General Appearance: Casual and Fairly Groomed  Patent attorney::  Good  Speech:  Clear and Coherent and Normal Rate  Volume:  Normal  Mood:  Euthymic  Affect:  Congruent and Constricted  Thought Process:  Coherent and Goal Directed  Orientation:  Full (Time, Place, and Person)  Thought Content:  Logical  Suicidal Thoughts:  No  Homicidal Thoughts:  No  Memory:  Immediate;   Fair Recent;   Fair Remote;   Fair  Judgement:  Fair  Insight:  Fair  Psychomotor Activity:  Normal  Concentration:  Good  Recall:  Good  Fund of Knowledge:Fair  Language: Fair  Akathisia:  No  Handed:    AIMS (if indicated):     Assets:  Communication Skills Resilience Social Support  Sleep:  Number of Hours: 6.75  Cognition: WNL  ADL's:  Intact   Mental Status Per Nursing Assessment::   On Admission:  Suicidal ideation indicated by patient, Self-harm thoughts, Suicide plan, Intention to act on suicide plan  Demographic Factors:  Male, Adolescent or young adult and Low socioeconomic status  Loss Factors: Financial problems/change in socioeconomic status  Historical Factors: Impulsivity  Risk Reduction Factors:   Sense of responsibility to family, Positive social support, Positive therapeutic relationship and Positive coping skills or problem solving skills  Continued Clinical Symptoms:  Bipolar Disorder:   Depressive phase  Cognitive Features That Contribute To Risk:  None    Suicide Risk:  Minimal: No identifiable suicidal ideation.  Patients presenting with no risk factors but with morbid ruminations; may be classified as minimal risk based on the severity of the depressive symptoms  Follow-up Information    [facility]. Go on 05/31/2018.   Specialty:  Behavioral Health Why:   Appointment is Tuesday, 05/31/18 at 8:00am.  Contact information: [address] [place] [address] [phone]         Subjective Data:  Dalton [name] is a 32 y/o M with history of Bipolar disorder and multiple substance use disorders (cocaine, alcohol, opiate) who was admitted voluntarily from [facility] where he presented with worsening depression, worsening substance use of cocaine/alcohol/heroin and SI with plan to drive his vehicle into a tree or another vehicle. Pt was started on alcohol withdrawal protocol and opiate withdrawal protocol. He was medically stabilized and then [facility] and [facility]. Pt was started on combination of abilify and lamictal, and he reported improvement of his presenting symptoms.  Today upon evaluation, pt shares, "I'm doing alright." He is sleeping well. His appetite is good. He denies other physical complaints. He denies SI/HI/AH/VH. He is tolerating his medications without difficulty or side effect. He is in agreement to continue his current regimen without changes and [facility] He expresses interest in intensive outpatient treatment for substance use treatment. He was able to engage in safety planning including plan to [facility] or contact emergency services if he feels unable to maintain his own safety or the safety of others. Pt had no further questions, comments, or concerns.   Plan Of Care/Follow-up recommendations:   -Discharge to outpatient level of care  -Bipolar I, most recent episode depressed, severe, without psychotic features     - Continue Abilify 15 mg po Q daily.     -Continue  Lamictal 50 mg po daily starting today.  -Insomnia.     - Continue Trazodone 50 mg po qhs.  Activity:  as tolerated Diet:  normal Tests:  NA Other:  see above for DC plan  Micheal Likenshristopher T Lorraina Spring, MD 05/26/2018, 8:32 AM

## 2019-01-15 ENCOUNTER — Emergency Department (HOSPITAL_COMMUNITY)
Admission: EM | Admit: 2019-01-15 | Discharge: 2019-01-16 | Disposition: A | Discharge status: Home / Self Care | Payer: Self-pay | Attending: Emergency Medicine | Admitting: Emergency Medicine

## 2019-01-15 ENCOUNTER — Other Ambulatory Visit: Payer: Self-pay

## 2019-01-15 ENCOUNTER — Emergency Department (HOSPITAL_COMMUNITY): Payer: Self-pay

## 2019-01-15 ENCOUNTER — Encounter (HOSPITAL_COMMUNITY): Payer: Self-pay

## 2019-01-15 DIAGNOSIS — E119 Type 2 diabetes mellitus without complications: Secondary | ICD-10-CM | POA: Insufficient documentation

## 2019-01-15 DIAGNOSIS — R197 Diarrhea, unspecified: Secondary | ICD-10-CM | POA: Insufficient documentation

## 2019-01-15 DIAGNOSIS — R1084 Generalized abdominal pain: Secondary | ICD-10-CM | POA: Insufficient documentation

## 2019-01-15 DIAGNOSIS — Z79899 Other long term (current) drug therapy: Secondary | ICD-10-CM | POA: Insufficient documentation

## 2019-01-15 DIAGNOSIS — J45909 Unspecified asthma, uncomplicated: Secondary | ICD-10-CM | POA: Insufficient documentation

## 2019-01-15 DIAGNOSIS — R112 Nausea with vomiting, unspecified: Secondary | ICD-10-CM | POA: Insufficient documentation

## 2019-01-15 DIAGNOSIS — F17210 Nicotine dependence, cigarettes, uncomplicated: Secondary | ICD-10-CM | POA: Insufficient documentation

## 2019-01-15 LAB — COMPREHENSIVE METABOLIC PANEL
ALBUMIN: 3.7 g/dL (ref 3.5–5.0)
ALT: 19 U/L (ref 0–44)
AST: 21 U/L (ref 15–41)
Alkaline Phosphatase: 42 U/L (ref 38–126)
Anion gap: 8 (ref 5–15)
BUN: 15 mg/dL (ref 6–20)
CHLORIDE: 105 mmol/L (ref 98–111)
CO2: 26 mmol/L (ref 22–32)
CREATININE: 0.99 mg/dL (ref 0.61–1.24)
Calcium: 8.7 mg/dL — ABNORMAL LOW (ref 8.9–10.3)
GFR calc Af Amer: >60 mL/min (ref >60)
GFR calc non Af Amer: >60 mL/min (ref >60)
Glucose, Bld: 94 mg/dL (ref 70–99)
Potassium: 3.8 mmol/L (ref 3.5–5.1)
Sodium: 139 mmol/L (ref 135–145)
Total Bilirubin: 0.7 mg/dL (ref 0.3–1.2)
Total Protein: 5.9 g/dL — ABNORMAL LOW (ref 6.5–8.1)

## 2019-01-15 LAB — CBC
HEMATOCRIT: 41.2 % (ref 39.0–52.0)
Hemoglobin: 14.5 g/dL (ref 13.0–17.0)
MCH: 27.5 pg (ref 26.0–34.0)
MCHC: 35.2 g/dL (ref 30.0–36.0)
MCV: 78.2 fL — AB (ref 80.0–100.0)
Platelets: 328 10*3/uL (ref 150–400)
RBC: 5.27 MIL/uL (ref 4.22–5.81)
RDW: 13.1 % (ref 11.5–15.5)
WBC: 12.9 10*3/uL — ABNORMAL HIGH (ref 4.0–10.5)
nRBC: 0 % (ref 0.0–0.2)

## 2019-01-15 LAB — INFLUENZA PANEL BY PCR (TYPE A & B)
Influenza A By PCR: NEGATIVE
Influenza B By PCR: NEGATIVE

## 2019-01-15 LAB — LIPASE, BLOOD: Lipase: 29 U/L (ref 11–51)

## 2019-01-15 MED ORDER — SODIUM CHLORIDE 0.9% FLUSH
3.0000 mL | Freq: Once | INTRAVENOUS | Status: AC
  Administered 2019-01-15: 3 mL via INTRAVENOUS

## 2019-01-15 MED ORDER — IOPAMIDOL (ISOVUE-300) INJECTION 61%
100.0000 mL | Freq: Once | INTRAVENOUS | Status: AC | PRN
  Administered 2019-01-15: 100 mL via INTRAVENOUS

## 2019-01-15 MED ORDER — SODIUM CHLORIDE 0.9 % IV BOLUS
1000.0000 mL | Freq: Once | INTRAVENOUS | Status: AC
  Administered 2019-01-15: 1000 mL via INTRAVENOUS

## 2019-01-15 MED ORDER — SODIUM CHLORIDE (PF) 0.9 % IJ SOLN
INTRAMUSCULAR | Status: AC
  Filled 2019-01-15: qty 50

## 2019-01-15 MED ORDER — IOPAMIDOL (ISOVUE-300) INJECTION 61%
INTRAVENOUS | Status: AC
  Filled 2019-01-15: qty 100

## 2019-01-15 MED ORDER — ONDANSETRON HCL 4 MG/2ML IJ SOLN
4.0000 mg | Freq: Once | INTRAMUSCULAR | Status: AC
  Administered 2019-01-15: 4 mg via INTRAVENOUS
  Filled 2019-01-15: qty 2

## 2019-01-15 NOTE — ED Triage Notes (Signed)
Pt was concerned for flu like symptoms. pt states his niece and nephew have been sick, but also went out to eat at a restaurant.  Pt states his legs have crampy. Pt states he has N/V/D and fatigue. Pt states fever of 101 at 1000

## 2019-01-15 NOTE — ED Provider Notes (Signed)
[facility]   CSN: [id number] Arrival date & time: 01/15/19  1609  History   Chief Complaint Chief Complaint  Patient presents with  . Generalized Body Aches  . Fever    HPI Dalton [name] is a 33 y.o. male with past medical history significant for bipolar disorder, asthma, polysubstance abuse who presents for evaluation of flu like symptoms.  Patient states his niece and his nephew whom he lives with were diagnosed with flu on Sunday.  Patient states he has had nausea, vomiting, diarrhea.  Emesis has been nonbloody, nonbilious. Has been able to tolerate p.o. liquids, however has had decreased appetite solid foods.  3 episodes of diarrhea which is nonbloody in nature.  He has had generalized abdominal pain.  Rates his pain a 4/10.  Pain does not radiate.  Has not taken anything for his symptoms.  Temp max 100.1 at home.  Did not received influenza vaccine.  History obtained from patient.  No interpreter was used.  HPI  Past Medical History:  Diagnosis Date  . Allergy   . Asthma   . Bipolar 1 disorder (HCC)   . Pulmonary tuberculosis     Patient Active Problem List   Diagnosis Date Noted  . Bipolar I disorder, most recent episode depressed, severe without psychotic features (HCC) 05/23/2018  . 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 Substance induced mood disorder (HCC) 01/30/2016  . 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 Alcohol use disorder, severe, dependence (HCC) 02/13/2015  . Cocaine use disorder, severe, dependence (HCC) 02/13/2015  . Alcohol withdrawal syndrome without complication (HCC) 02/13/2015  . Opioid use disorder, severe, dependence (HCC) 02/13/2015  . Opioid withdrawal (HCC) 02/13/2015  . Opiate dependence, continuous (HCC) 10/13/2014  . 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 Eyelid abnormality 03/12/2012  . DIABETES MELLITUS, TYPE II 11/10/2010  .  ABSCESS, GLUTEAL 10/20/2010  . CERVICAL LYMPHADENOPATHY 05/16/2010  . OTH SPEC PULMONARY TB-HISTO DX 10/02/2008  . VIRAL URI 10/02/2008  . PLEURAL EFFUSION, LEFT 03/22/2008  . ALLERGIC RHINITIS 02/01/2008  . ASTHMA 02/01/2008    Past Surgical History:  Procedure Laterality Date  . TONSILLECTOMY          Home Medications    Prior to Admission medications   Medication Sig Start Date End Date Taking? Authorizing Provider  albuterol (PROVENTIL HFA;VENTOLIN HFA) 108 (90 Base) MCG/ACT inhaler Inhale 2 puffs into the lungs every 4 (four) hours as needed for wheezing or shortness of breath. 05/26/18   Armandina Stammer I, NP  ARIPiprazole (ABILIFY) 10 MG tablet Take 1 tablet (10 mg total) by mouth daily. For mood control 05/27/18   Armandina Stammer I, NP  hydrOXYzine (ATARAX/VISTARIL) 25 MG tablet Take 1 tablet (25 mg total) by mouth every 6 (six) hours as needed (anxiety/agitation). 05/26/18   Armandina Stammer I, NP  lamoTRIgine (LAMICTAL) 25 MG tablet Take 2 tablets (50 mg total) by mouth daily. For mood stabilization 05/27/18   Nwoko, Nicole Kindred I, NP  nicotine (NICODERM CQ - DOSED IN MG/24 HOURS) 21 mg/24hr patch Place 1 patch (21 mg total) onto the skin daily. (May buy from over the counter): For smoking cessation 05/27/18   Armandina Stammer I, NP  ondansetron (ZOFRAN ODT) 4 MG disintegrating tablet Take 1 tablet (4 mg total) by mouth every 8 (eight) hours as needed for nausea or vomiting. 01/16/19   Keyira Mondesir A, PA-C  traZODone (DESYREL) 50 MG tablet Take 1 tablet (50 mg  total) by mouth at bedtime as needed for sleep. 05/26/18   Sanjuana KavaNwoko, Agnes I, NP    Family History No family history on file.  Social History Social History   Tobacco Use  . Smoking status: Current Every Day Smoker    Packs/day: 0.50    Types: Cigarettes  . Smokeless tobacco: Never Used  . Tobacco comment: 4 ciggs qd  Substance Use Topics  . Alcohol use: Yes  . Drug use: Yes    Comment: former substance abuse     Allergies     Patient has no known allergies.   Review of Systems Review of Systems  Constitutional: Positive for appetite change, fatigue and fever.  HENT: Negative for congestion, postnasal drip, rhinorrhea, sinus pressure, sinus pain, sore throat, tinnitus, trouble swallowing and voice change.   Eyes: Negative.   Respiratory: Negative.  Negative for cough, choking, chest tightness, shortness of breath, wheezing and stridor.   Cardiovascular: Negative.   Gastrointestinal: Positive for abdominal pain, diarrhea, nausea and vomiting. Negative for rectal pain.  Genitourinary: Negative.   Musculoskeletal: Negative.   Skin: Negative.   Neurological: Negative.   All other systems reviewed and are negative.    Physical Exam Updated Vital Signs BP 98/64   Pulse 73   Temp 99.4 F (37.4 C) (Oral)   Resp 17   Ht 5\' 11"  (1.803 m)   Wt 63.7 kg   SpO2 97%   BMI 19.60 kg/m   Physical Exam Vitals signs and nursing note reviewed.  Constitutional:      General: He is not in acute distress.    Appearance: He is well-developed. He is not ill-appearing, toxic-appearing or diaphoretic.     Comments: Patient sitting up in bed listening to music on initial evaluation.  Does not appear in any acute distress.  HENT:     Head: Normocephalic and atraumatic.     Jaw: There is normal jaw occlusion.     Nose: Congestion and rhinorrhea present.     Right Sinus: No maxillary sinus tenderness or frontal sinus tenderness.     Left Sinus: No maxillary sinus tenderness or frontal sinus tenderness.     Comments: Clear rhinorrhea to bilateral nares.    Mouth/Throat:     Lips: Pink.     Mouth: Mucous membranes are moist.     Comments: Posterior oropharynx erythematous without exudate.  Tonsils without edema or exudate.  Uvula midline without deviation.  Membranes moist. Eyes:     Pupils: Pupils are equal, round, and reactive to light.  Neck:     Musculoskeletal: Normal range of motion and neck supple.      Comments: No neck stiffness or neck rigidity.  Phonation normal.  No drooling or dysphasia. Cardiovascular:     Rate and Rhythm: Regular rhythm. Tachycardia present.     Pulses: Normal pulses.     Heart sounds: Normal heart sounds.  Pulmonary:     Effort: Pulmonary effort is normal. No respiratory distress.     Comments: Clear to auscultation bilaterally without wheeze, rhonchi or rales.  No accessory muscle usage.  No tachypnea Abdominal:     General: There is no distension.     Palpations: Abdomen is soft.     Comments: Soft, nontender without rebound or guarding.  Normoactive bowel sounds.  Musculoskeletal: Normal range of motion.     Comments: Moves all extremities without difficulty.  Ambulatory department without difficulty.  Skin:    General: Skin is warm and dry.  Comments: No rashes  Neurological:     Mental Status: He is alert.      ED Treatments / Results  Labs (all labs ordered are listed, but only abnormal results are displayed) Labs Reviewed  COMPREHENSIVE METABOLIC PANEL - Abnormal; Notable for the following components:      Result Value   Calcium 8.7 (*)    Total Protein 5.9 (*)    All other components within normal limits  CBC - Abnormal; Notable for the following components:   WBC 12.9 (*)    MCV 78.2 (*)    All other components within normal limits  LIPASE, BLOOD  INFLUENZA PANEL BY PCR (TYPE A & B)  URINALYSIS, ROUTINE W REFLEX MICROSCOPIC    EKG None  Radiology Ct Abdomen Pelvis W Contrast  Result Date: 01/15/2019 CLINICAL DATA:  33 y/o M; nausea, vomiting, diarrhea, fatigue, fever, flu like symptoms. EXAM: CT ABDOMEN AND PELVIS WITH CONTRAST TECHNIQUE: Multidetector CT imaging of the abdomen and pelvis was performed using the standard protocol following bolus administration of intravenous contrast. CONTRAST:  ISOVUE-300 IOPAMIDOL (ISOVUE-300) INJECTION 61% COMPARISON:  None. FINDINGS: Lower chest: No acute abnormality. Hepatobiliary: No  focal liver abnormality is seen. No gallstones, gallbladder wall thickening, or biliary dilatation. Pancreas: Unremarkable. No pancreatic ductal dilatation or surrounding inflammatory changes. Spleen: Normal in size without focal abnormality. Adrenals/Urinary Tract: Adrenal glands are unremarkable. Kidneys are normal, without renal calculi, focal lesion, or hydronephrosis. Bladder is unremarkable. Stomach/Bowel: Stomach is within normal limits. Appendix appears normal. No evidence of bowel wall thickening, distention, or inflammatory changes. Vascular/Lymphatic: No significant vascular findings are present. No enlarged abdominal or pelvic lymph nodes. Reproductive: Negative. Other: No abdominal wall hernia or abnormality. No abdominopelvic ascites. Musculoskeletal: No acute or significant osseous findings. IMPRESSION: Acute process identified. Unremarkable CT of the abdomen and pelvis. Electronically Signed   By: Mitzi Hansen M.D.   On: 01/15/2019 23:41    Procedures Procedures (including critical care time)  Medications Ordered in ED Medications  iopamidol (ISOVUE-300) 61 % injection (has no administration in time range)  sodium chloride (PF) 0.9 % injection (has no administration in time range)  sodium chloride flush (NS) 0.9 % injection 3 mL (3 mLs Intravenous Given 01/15/19 2121)  ondansetron (ZOFRAN) injection 4 mg (4 mg Intravenous Given 01/15/19 2122)  sodium chloride 0.9 % bolus 1,000 mL (0 mLs Intravenous Stopped 01/15/19 2240)  iopamidol (ISOVUE-300) 61 % injection 100 mL (100 mLs Intravenous Contrast Given 01/15/19 2328)     Initial Impression / Assessment and Plan / ED Course  I have reviewed the triage vital signs and the nursing notes.  Pertinent labs & imaging results that were available during my care of the patient were reviewed by me and considered in my medical decision making (see chart for details).  33 year old male appears otherwise well presents for evaluation of   N/V/D.  Afebrile, nonseptic, non-ill-appearing.  Abdomen soft, nontender without rebound or guarding.  Normoactive bowel sounds.  Posterior oropharynx clear.  Mucous membranes moist.  Mildly tachycardic on initial evaluation, improved with fluids. Lungs clear to auscultation bilaterally without wheeze, rhonchi or rales.  No accessory muscle usage.  No tachypnea or hypoxia, low suspicion for pneumonia.  Will obtain labs, fluids, antiemetics and reevaluate.  0010: CBC with mild leukocytosis at 12.9, metabolic panel without electrolyte, renal or liver abnormality, lipase 29, influenza negative, patient refusing to provide urine sample at this time.  Patient states "you don't need that, I do not have issues peeing."  CT scan without acute abdominopelvic pathology.  Patient able to tolerate p.o. intake in department thought difficulty.  Patient is nontoxic, nonseptic appearing, in no apparent distress.  Patient's pain and other symptoms adequately managed in emergency department.  Fluid bolus given.  Labs, imaging and vitals reviewed.  Patient does not meet the SIRS or Sepsis criteria.  On repeat exam patient does not have a surgical abdomin and there are no peritoneal signs.  No indication of appendicitis, bowel obstruction, bowel perforation, cholecystitis, diverticulitis.  Patient discharged home with symptomatic treatment and given strict instructions for follow-up with their primary care physician.  I have also discussed reasons to return immediately to the ER.  Patient expresses understanding and agrees with plan.    Final Clinical Impressions(s) / ED Diagnoses   Final diagnoses:  Nausea vomiting and diarrhea    ED Discharge Orders         Ordered    ondansetron (ZOFRAN ODT) 4 MG disintegrating tablet  Every 8 hours PRN     01/16/19 0009           Puneet Selden A, PA-C 01/16/19 0017    Loren RacerYelverton, David, MD 01/16/19 2337

## 2019-01-16 MED ORDER — ONDANSETRON 4 MG PO TBDP: 4.0000 mg | ORAL_TABLET | Freq: Three times a day (TID) | ORAL | 0 refills | Status: AC | PRN

## 2019-01-16 NOTE — ED Notes (Signed)
Pt tolerating fluids at this time.  

## 2019-01-16 NOTE — Discharge Instructions (Addendum)
Evaluated today for emesis and diarrhea. CT scan was negative. Your labs are unremarkable.  I have written you prescription for Zofran.  Please take as prescribed.  Return to the ED for any worsening symptoms.

## 2019-02-18 IMAGING — CT CT CHEST W/O CM
2 of 3 series · 13 of 36 positions shown, 16 images · non-contrast
Comparison: Chest radiograph 03/25/2017

CLINICAL DATA: Shortness of breath for 2 months, getting worse.
Productive cough for 1 month. Intermittent chest pain and
hemoptysis. History of TB and asthma.

EXAM:
CT CHEST WITHOUT CONTRAST
TECHNIQUE: Multidetector CT imaging of the chest was performed following the
standard protocol without IV contrast.

[Series 201: chest without, idose (2) · axial · non-contrast · 0.74mm/px · z∈[+54,+349]mm · 10 of 140 slices shown, 13 images]
[im 11/140  mediastinal]
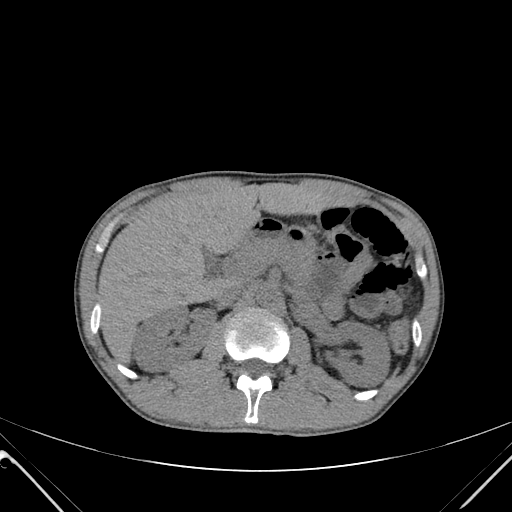
[im 11/140  lung]
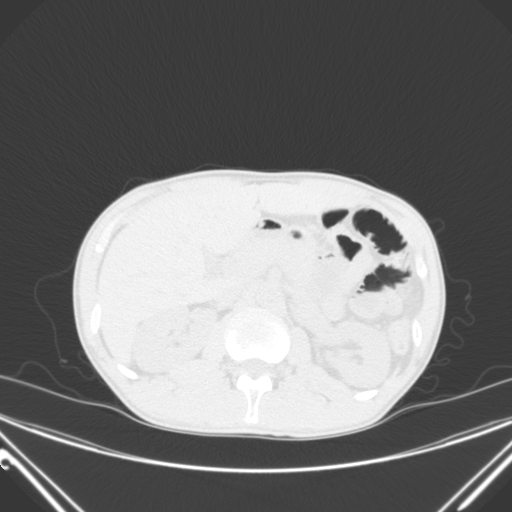
[im 21/140  lung]
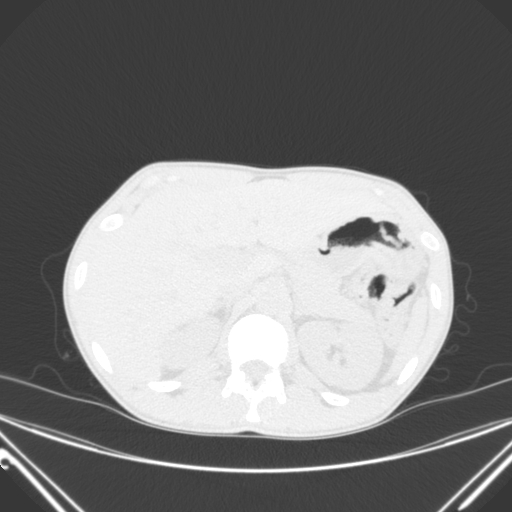
[im 37/140  lung]
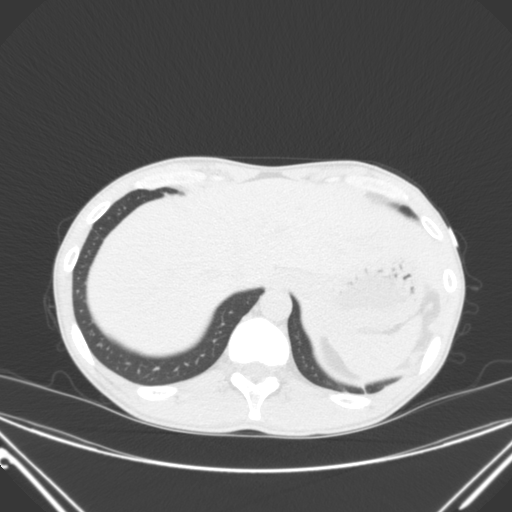
[im 52/140  lung]
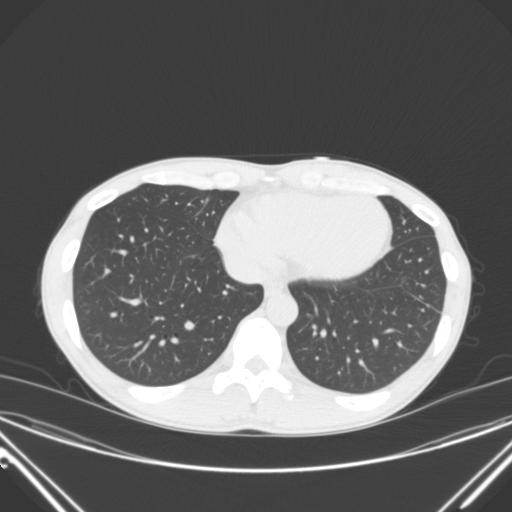
[im 62/140  mediastinal]
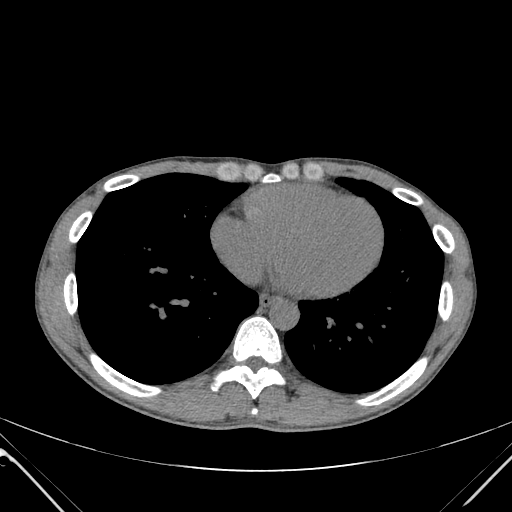
[im 62/140  lung]
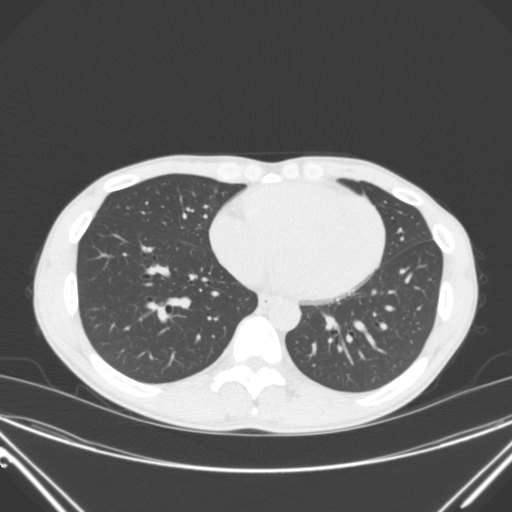
[im 78/140  lung]
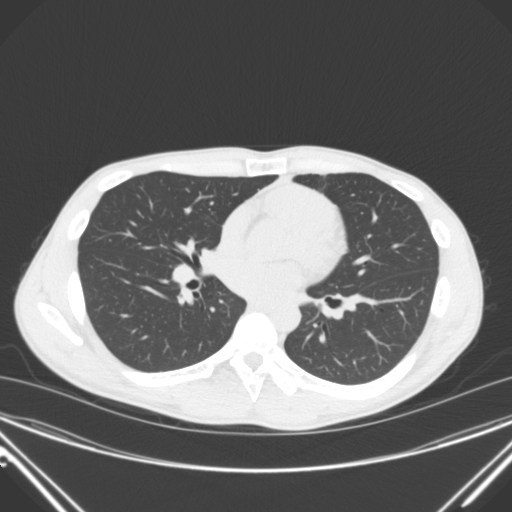
[im 88/140  lung]
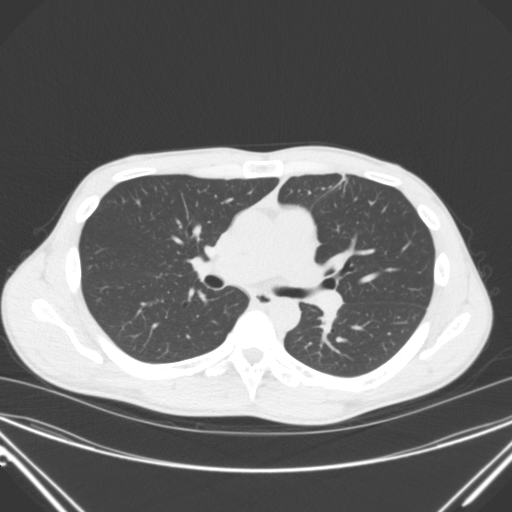
[im 103/140  lung]
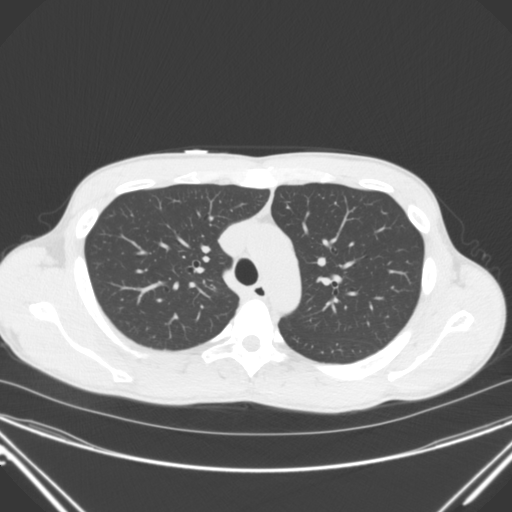
[im 119/140  mediastinal]
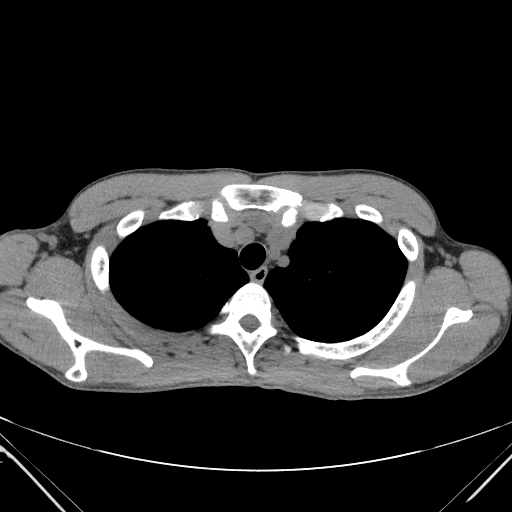
[im 119/140  lung]
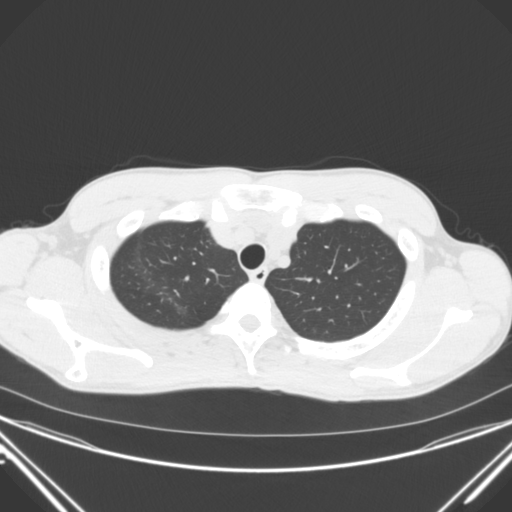
[im 129/140  lung]
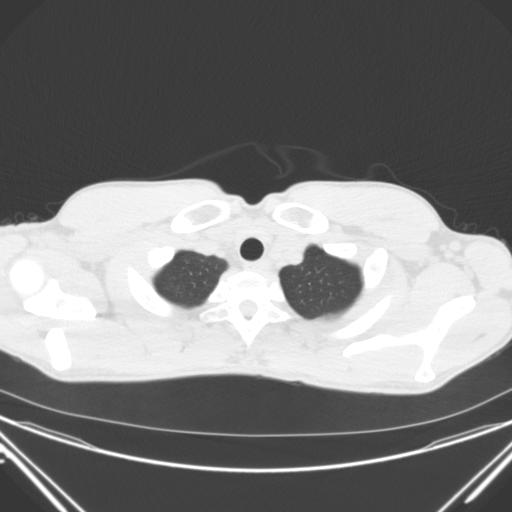

[Series 203: coronal, idose (2) · coronal · 0.45mm/px · 3 of 91 slices shown]
[im 19/91  lung]
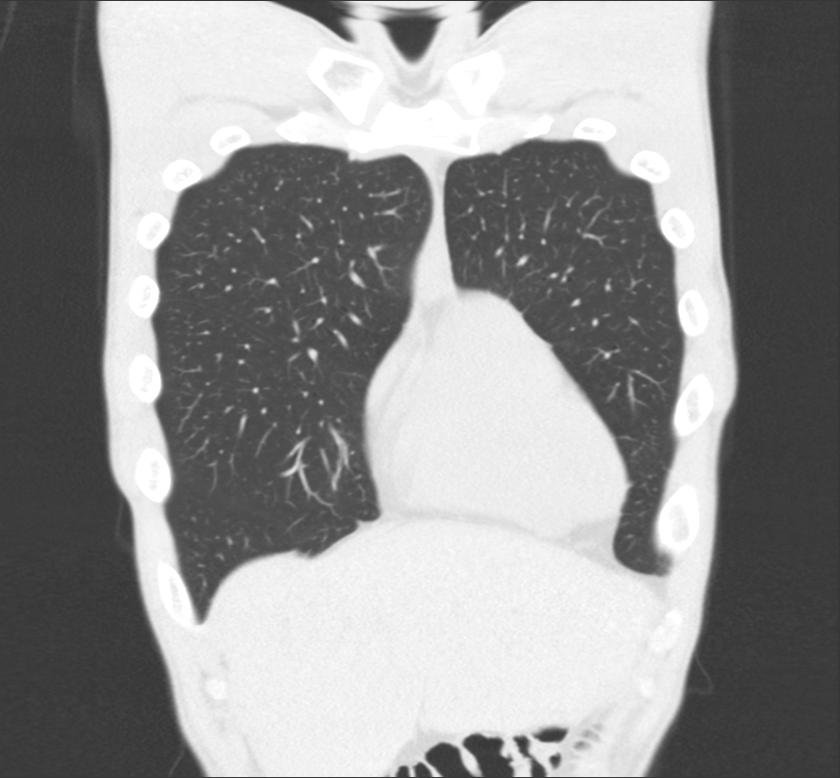
[im 37/91  lung]
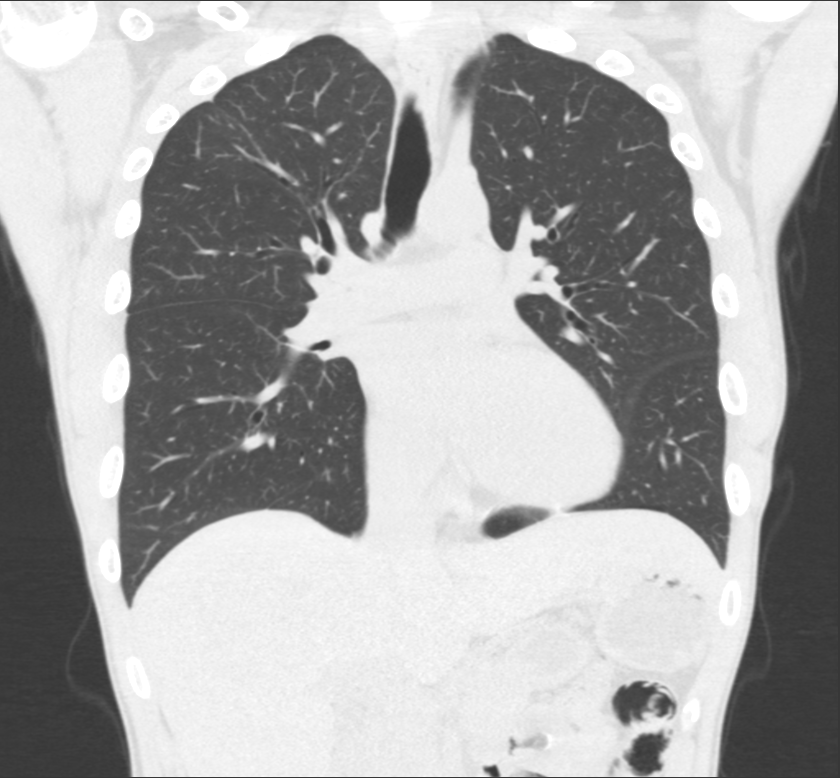
[im 55/91  lung]
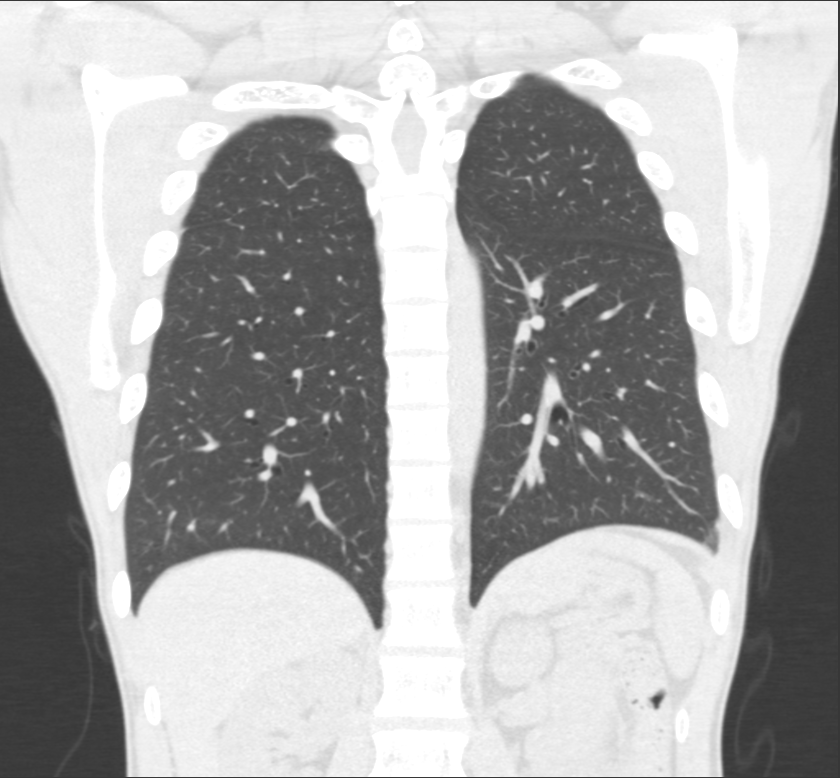

[13 of 36 positions shown; findings below may reference images not displayed]

FINDINGS: Cardiovascular: No significant vascular findings. Normal heart size.
No pericardial effusion.

Mediastinum/Nodes: No enlarged mediastinal or axillary lymph nodes.
Thyroid gland, trachea, and esophagus demonstrate no significant
findings.

Lungs/Pleura: Lungs are clear. No pleural effusion or pneumothorax.

Upper Abdomen: No acute abnormality.

Musculoskeletal: No chest wall mass or suspicious bone lesions
identified.
IMPRESSION: No acute process demonstrated in the chest.  Lungs are clear.

## 2019-06-15 ENCOUNTER — Encounter (HOSPITAL_COMMUNITY): Payer: Self-pay

## 2019-06-15 ENCOUNTER — Emergency Department (HOSPITAL_COMMUNITY)
Admission: EM | Admit: 2019-06-15 | Discharge: 2019-06-15 | Disposition: A | Discharge status: Home / Self Care | Payer: Self-pay | Attending: Emergency Medicine | Admitting: Emergency Medicine

## 2019-06-15 ENCOUNTER — Other Ambulatory Visit: Payer: Self-pay

## 2019-06-15 DIAGNOSIS — J45909 Unspecified asthma, uncomplicated: Secondary | ICD-10-CM | POA: Insufficient documentation

## 2019-06-15 DIAGNOSIS — F17210 Nicotine dependence, cigarettes, uncomplicated: Secondary | ICD-10-CM | POA: Insufficient documentation

## 2019-06-15 DIAGNOSIS — E119 Type 2 diabetes mellitus without complications: Secondary | ICD-10-CM | POA: Insufficient documentation

## 2019-06-15 DIAGNOSIS — M545 Low back pain, unspecified: Secondary | ICD-10-CM

## 2019-06-15 DIAGNOSIS — Z79899 Other long term (current) drug therapy: Secondary | ICD-10-CM | POA: Insufficient documentation

## 2019-06-15 MED ORDER — IBUPROFEN 600 MG PO TABS: 600.0000 mg | ORAL_TABLET | Freq: Four times a day (QID) | ORAL | 0 refills | Status: AC | PRN

## 2019-06-15 NOTE — ED Provider Notes (Signed)
[facility] Provider Note   CSN: [id number] Arrival date & time: 06/15/19  0418     History   Chief Complaint Chief Complaint  Patient presents with  . Back Pain    HPI Dalton [name] is a 33 y.o. male.     The history is provided by the patient. No language interpreter was used.  Back Pain Location:  Lumbar spine Radiates to:  Does not radiate Pain severity:  Mild Onset quality:  Gradual Duration:  1 week Timing:  Constant Progression:  Unchanged Chronicity:  New Context: not falling and not recent injury   Relieved by:  Nothing Ineffective treatments:  None tried Associated symptoms: no bladder incontinence, no bowel incontinence, no fever, no numbness, no paresthesias, no perianal numbness and no tingling   Risk factors: no hx of cancer and not obese     Past Medical History:  Diagnosis Date  . Allergy   . Asthma   . Bipolar 1 disorder (HCC)   . Pulmonary tuberculosis     Patient Active Problem List   Diagnosis Date Noted  . Bipolar I disorder, most recent episode depressed, severe without psychotic features (HCC) 05/23/2018  . 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 Substance induced mood disorder (HCC) 01/30/2016  . 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 Alcohol use disorder, severe, dependence (HCC) 02/13/2015  . Cocaine use disorder, severe, dependence (HCC) 02/13/2015  . Alcohol withdrawal syndrome without complication (HCC) 02/13/2015  . Opioid use disorder, severe, dependence (HCC) 02/13/2015  . Opioid withdrawal (HCC) 02/13/2015  . Opiate dependence, continuous (HCC) 10/13/2014  . 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 Eyelid abnormality 03/12/2012  . DIABETES MELLITUS, TYPE II 11/10/2010  . ABSCESS, GLUTEAL 10/20/2010  . CERVICAL LYMPHADENOPATHY 05/16/2010  . OTH SPEC PULMONARY TB-HISTO DX 10/02/2008  . VIRAL URI 10/02/2008  . PLEURAL EFFUSION,  LEFT 03/22/2008  . ALLERGIC RHINITIS 02/01/2008  . ASTHMA 02/01/2008    Past Surgical History:  Procedure Laterality Date  . TONSILLECTOMY          Home Medications    Prior to Admission medications   Medication Sig Start Date End Date Taking? Authorizing Provider  albuterol (PROVENTIL HFA;VENTOLIN HFA) 108 (90 Base) MCG/ACT inhaler Inhale 2 puffs into the lungs every 4 (four) hours as needed for wheezing or shortness of breath. 05/26/18   Armandina StammerNwoko, Agnes I, NP  ARIPiprazole (ABILIFY) 10 MG tablet Take 1 tablet (10 mg total) by mouth daily. For mood control 05/27/18   Armandina StammerNwoko, Agnes I, NP  hydrOXYzine (ATARAX/VISTARIL) 25 MG tablet Take 1 tablet (25 mg total) by mouth every 6 (six) hours as needed (anxiety/agitation). 05/26/18   Armandina StammerNwoko, Agnes I, NP  ibuprofen (ADVIL) 600 MG tablet Take 1 tablet (600 mg total) by mouth every 6 (six) hours as needed. 06/15/19   Antony MaduraHumes, Zorian Gunderman, PA-C  lamoTRIgine (LAMICTAL) 25 MG tablet Take 2 tablets (50 mg total) by mouth daily. For mood stabilization 05/27/18   Nwoko, Nicole KindredAgnes I, NP  nicotine (NICODERM CQ - DOSED IN MG/24 HOURS) 21 mg/24hr patch Place 1 patch (21 mg total) onto the skin daily. (May buy from over the counter): For smoking cessation 05/27/18   Armandina StammerNwoko, Agnes I, NP  ondansetron (ZOFRAN ODT) 4 MG disintegrating tablet Take 1 tablet (4 mg total) by mouth every 8 (eight) hours as needed for nausea or vomiting. 01/16/19   Henderly, Britni A, PA-C  traZODone (DESYREL) 50 MG tablet Take 1 tablet (50  mg total) by mouth at bedtime as needed for sleep. 05/26/18   Encarnacion Slates, NP    Family History No family history on file.  Social History Social History   Tobacco Use  . Smoking status: Current Every Day Smoker    Packs/day: 0.50    Types: Cigarettes  . Smokeless tobacco: Never Used  . Tobacco comment: 4 ciggs qd  Substance Use Topics  . Alcohol use: Yes  . Drug use: Yes    Comment: former substance abuse     Allergies   Patient has no known allergies.    Review of Systems Review of Systems  Constitutional: Negative for fever.  Gastrointestinal: Negative for bowel incontinence.  Genitourinary: Negative for bladder incontinence.  Musculoskeletal: Positive for back pain.  Neurological: Negative for tingling, numbness and paresthesias.  Ten systems reviewed and are negative for acute change, except as noted in the HPI.    Physical Exam Updated Vital Signs BP (!) 131/92 (BP Location: Left Arm)   Pulse 88   Temp 99.1 F (37.3 C) (Oral)   Resp 16   SpO2 99%   Physical Exam Vitals signs and nursing note reviewed.  Constitutional:      General: He is not in acute distress.    Appearance: He is well-developed. He is not diaphoretic.     Comments: Nontoxic appearing and in NAD  HENT:     Head: Normocephalic and atraumatic.  Eyes:     General: No scleral icterus.    Conjunctiva/sclera: Conjunctivae normal.  Neck:     Musculoskeletal: Normal range of motion.  Cardiovascular:     Rate and Rhythm: Normal rate and regular rhythm.     Pulses: Normal pulses.  Pulmonary:     Effort: Pulmonary effort is normal. No respiratory distress.     Comments: Respirations even and unlabored Musculoskeletal: Normal range of motion.     Thoracic back: He exhibits tenderness, pain (minimal) and spasm (mild). He exhibits no edema and no deformity.       Back:  Skin:    General: Skin is warm and dry.     Coloration: Skin is not pale.     Findings: No erythema or rash.  Neurological:     General: No focal deficit present.     Mental Status: He is alert and oriented to person, place, and time.     Coordination: Coordination normal.     Comments: GCS 15.  Moving all extremities spontaneously.  Psychiatric:        Behavior: Behavior normal.      ED Treatments / Results  Labs (all labs ordered are listed, but only abnormal results are displayed) Labs Reviewed - No data to display  EKG None  Radiology No results found.  Procedures  Procedures (including critical care time)  Medications Ordered in ED Medications - No data to display   Initial Impression / Assessment and Plan / ED Course  I have reviewed the triage vital signs and the nursing notes.  Pertinent labs & imaging results that were available during my care of the patient were reviewed by me and considered in my medical decision making (see chart for details).        Patient with back pain x 1 week.  Denies any recent trauma or injury.  He is neurovascularly intact on exam.  Pain reproducible on palpation just to the right of the upper lumbar spine.  No bony deformities, step-offs, crepitus to the thoracic or lumbosacral midline.  No loss of bowel or bladder control.  No concern for cauda equina.  No fever, night sweats, weight loss, h/o cancer, IVDU.  RICE protocol and pain medicine indicated and discussed with patient.  Encouraged f/u with PCP.  Return precautions discussed and provided. Patient discharged in stable condition with no unaddressed concerns.   Final Clinical Impressions(s) / ED Diagnoses   Final diagnoses:  Acute midline low back pain without sciatica    ED Discharge Orders         Ordered    ibuprofen (ADVIL) 600 MG tablet  Every 6 hours PRN     06/15/19 0637           Antony MaduraHumes, Nehemiah Montee, PA-C 06/15/19 0646    Nira Connardama, Pedro Eduardo, MD 06/18/19 [phone]

## 2019-06-15 NOTE — Discharge Instructions (Signed)
Alternate ice and heat to areas of injury 3-4 times per day to limit inflammation and spasm.  Avoid strenuous activity and heavy lifting.  We recommend consistent use of ibuprofen as prescribed.  Follow-up with a primary care doctor to ensure resolution of symptoms.  Return to the ED for any new or concerning symptoms.

## 2019-06-15 NOTE — ED Triage Notes (Signed)
Pt arrived stating that his lower back has  been bothering him for a week. Pt states he started a new job and is now working two full time jobs. Pt ambulatory and denies any other symptoms.

## 2019-07-12 ENCOUNTER — Encounter (HOSPITAL_COMMUNITY): Payer: Self-pay

## 2019-07-12 ENCOUNTER — Other Ambulatory Visit: Payer: Self-pay

## 2019-07-12 ENCOUNTER — Emergency Department (HOSPITAL_COMMUNITY)
Admission: EM | Admit: 2019-07-12 | Discharge: 2019-07-12 | Disposition: A | Discharge status: Home / Self Care | Payer: PRIVATE HEALTH INSURANCE | Attending: Emergency Medicine | Admitting: Emergency Medicine

## 2019-07-12 DIAGNOSIS — Z79899 Other long term (current) drug therapy: Secondary | ICD-10-CM | POA: Diagnosis not present

## 2019-07-12 DIAGNOSIS — L03211 Cellulitis of face: Secondary | ICD-10-CM | POA: Diagnosis not present

## 2019-07-12 DIAGNOSIS — Z8614 Personal history of Methicillin resistant Staphylococcus aureus infection: Secondary | ICD-10-CM | POA: Insufficient documentation

## 2019-07-12 DIAGNOSIS — F17210 Nicotine dependence, cigarettes, uncomplicated: Secondary | ICD-10-CM | POA: Diagnosis not present

## 2019-07-12 DIAGNOSIS — L723 Sebaceous cyst: Secondary | ICD-10-CM | POA: Insufficient documentation

## 2019-07-12 DIAGNOSIS — J45909 Unspecified asthma, uncomplicated: Secondary | ICD-10-CM | POA: Insufficient documentation

## 2019-07-12 DIAGNOSIS — E119 Type 2 diabetes mellitus without complications: Secondary | ICD-10-CM | POA: Insufficient documentation

## 2019-07-12 DIAGNOSIS — R220 Localized swelling, mass and lump, head: Secondary | ICD-10-CM | POA: Diagnosis present

## 2019-07-12 MED ORDER — CEPHALEXIN 500 MG PO CAPS: 500.0000 mg | ORAL_CAPSULE | Freq: Four times a day (QID) | ORAL | 0 refills | Status: AC

## 2019-07-12 MED ORDER — SULFAMETHOXAZOLE-TRIMETHOPRIM 800-160 MG PO TABS
1.0000 | ORAL_TABLET | Freq: Once | ORAL | Status: AC
  Administered 2019-07-12: 1 via ORAL
  Filled 2019-07-12: qty 1

## 2019-07-12 MED ORDER — LIDOCAINE-EPINEPHRINE (PF) 2 %-1:200000 IJ SOLN
10.0000 mL | Freq: Once | INTRAMUSCULAR | Status: AC
  Administered 2019-07-12: 10 mL
  Filled 2019-07-12: qty 20

## 2019-07-12 MED ORDER — SULFAMETHOXAZOLE-TRIMETHOPRIM 800-160 MG PO TABS: 1.0000 | ORAL_TABLET | Freq: Two times a day (BID) | ORAL | 0 refills | Status: AC

## 2019-07-12 MED ORDER — CEPHALEXIN 250 MG PO CAPS
500.0000 mg | ORAL_CAPSULE | Freq: Once | ORAL | Status: AC
  Administered 2019-07-12: 500 mg via ORAL
  Filled 2019-07-12: qty 2

## 2019-07-12 NOTE — Discharge Instructions (Addendum)
Wound Care - After I&D of Abscess  You may remove the bandage after 24 hours.  The only reason to replace the bandage is to protect clothing from drainage. Bandages, if used, should be replaced daily or whenever soiled. The wound may continue to drain for the next 2-3 days.   Cleaning: Clean the wound and surrounding area gently with tap water and mild soap. Rinse well and blot dry. You may shower normally. Soaking the wound in Epsom salt baths for no more than 15 minutes once a day may help rinse out any remaining pus and help with wound healing.  Clean the wound daily to prevent further infection. Do not use cleaners such as hydrogen peroxide or alcohol.   Scar reduction: Application of a topical antibiotic ointment, such as Neosporin, after the wound has begun to close and heal well can decrease scab formation and reduce scarring. After the wound has healed, application of ointments such as Aquaphor can also reduce scar formation.  The key to scar reduction is keeping the skin well hydrated and supple. Drinking plenty of water throughout the day (At least eight 8oz glasses of water a day) is essential to staying well hydrated.  Please take all of your antibiotics until finished!   You may develop abdominal discomfort or diarrhea from the antibiotic.  You may help offset this with probiotics which you can buy or get in yogurt. Do not eat or take the probiotics until 2 hours after your antibiotic.   Sun exposure: Keep the wound out of the sun. After the wound has healed, continue to protect it from the sun by wearing protective clothing or applying sunscreen.  Pain: You may use Tylenol, naproxen, or ibuprofen for pain.  Prevention: There is some people that have a predisposition to abscess formation, however, there are some things that can be done to prevent abscesses in many people.  Most abscesses form because bacteria that naturally lives on the skin gets trapped underneath the skin.  This can  occur through openings too small to see. Before and after any area of skin is shaved, wax, or abraded in any manner, the area should be washed with soap and water and rinsed well.   If you are having trouble with recurrent abscesses, it may be wise to perform a chlorhexidine wash regimen.  For 1 week, wash all of your body with chlorhexidine (available over-the-counter at most pharmacies). You may also need to reevaluate your use of daily soap as soaps with perfumes or dyes can increase the chances of infection in some people.  Follow up: Please return to the ED or go to your primary care provider in 2-3 days for a wound check to assure proper healing.  These types of cysts many times will have to be removed surgically to prevent re-formation.  For further management of this cyst, follow-up with plastic surgery.  Return: Return to the ED sooner should signs of worsening infection arise, such as spreading redness, worsening puffiness/swelling, severe increase in pain, fever over 100.83F, or any other major issues.  For prescription assistance, may try using prescription discount sites or apps, such as goodrx.com

## 2019-07-12 NOTE — ED Provider Notes (Signed)
[facility] Note   CSN: [id number] Arrival date & time: 07/12/19  1843    History   Chief Complaint Chief Complaint  Patient presents with  . Abscess    HPI Dalton [name] is a 33 y.o. male.     HPI   Dalton [name] is a 33 y.o. male, with a history of asthma, bipolar, MRSA, presenting to the ED with area of swelling to the right side of the scalp.  He states he has had a "bump" to the area for about the last 6 months, however, over the last few days the bump has grown, turned red, and become painful. He has not tried any therapies for his complaint. Denies fever/chills, dizziness, headaches, drainage from the region, or any other complaints.    Past Medical History:  Diagnosis Date  . Allergy   . Asthma   . Bipolar 1 disorder (HCC)   . Pulmonary tuberculosis     Patient Active Problem List   Diagnosis Date Noted  . Bipolar I disorder, most recent episode depressed, severe without psychotic features (HCC) 05/23/2018  . 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 Substance induced mood disorder (HCC) 01/30/2016  . 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 Alcohol use disorder, severe, dependence (HCC) 02/13/2015  . Cocaine use disorder, severe, dependence (HCC) 02/13/2015  . Alcohol withdrawal syndrome without complication (HCC) 02/13/2015  . Opioid use disorder, severe, dependence (HCC) 02/13/2015  . Opioid withdrawal (HCC) 02/13/2015  . Opiate dependence, continuous (HCC) 10/13/2014  . 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 Eyelid abnormality 03/12/2012  . DIABETES MELLITUS, TYPE II 11/10/2010  . ABSCESS, GLUTEAL 10/20/2010  . CERVICAL LYMPHADENOPATHY 05/16/2010  . OTH SPEC PULMONARY TB-HISTO DX 10/02/2008  . VIRAL URI 10/02/2008  . PLEURAL EFFUSION, LEFT 03/22/2008  . ALLERGIC RHINITIS 02/01/2008  . ASTHMA 02/01/2008    Past Surgical History:   Procedure Laterality Date  . TONSILLECTOMY          Home Medications    Prior to Admission medications   Medication Sig Start Date End Date Taking? Authorizing Provider  albuterol (PROVENTIL HFA;VENTOLIN HFA) 108 (90 Base) MCG/ACT inhaler Inhale 2 puffs into the lungs every 4 (four) hours as needed for wheezing or shortness of breath. 05/26/18   Armandina StammerNwoko, Agnes I, NP  ARIPiprazole (ABILIFY) 10 MG tablet Take 1 tablet (10 mg total) by mouth daily. For mood control 05/27/18   Armandina StammerNwoko, Agnes I, NP  cephALEXin (KEFLEX) 500 MG capsule Take 1 capsule (500 mg total) by mouth 4 (four) times daily. 07/12/19   Joy, Shawn C, PA-C  hydrOXYzine (ATARAX/VISTARIL) 25 MG tablet Take 1 tablet (25 mg total) by mouth every 6 (six) hours as needed (anxiety/agitation). 05/26/18   Armandina StammerNwoko, Agnes I, NP  ibuprofen (ADVIL) 600 MG tablet Take 1 tablet (600 mg total) by mouth every 6 (six) hours as needed. 06/15/19   Antony MaduraHumes, Kelly, PA-C  lamoTRIgine (LAMICTAL) 25 MG tablet Take 2 tablets (50 mg total) by mouth daily. For mood stabilization 05/27/18   Nwoko, Nicole KindredAgnes I, NP  nicotine (NICODERM CQ - DOSED IN MG/24 HOURS) 21 mg/24hr patch Place 1 patch (21 mg total) onto the skin daily. (May buy from over the counter): For smoking cessation 05/27/18   Armandina StammerNwoko, Agnes I, NP  ondansetron (ZOFRAN ODT) 4 MG disintegrating tablet Take 1 tablet (4 mg total) by mouth every 8 (eight) hours as needed for nausea or vomiting. 01/16/19  Henderly, Britni A, PA-C  sulfamethoxazole-trimethoprim (BACTRIM DS) 800-160 MG tablet Take 1 tablet by mouth 2 (two) times daily for 7 days. 07/12/19 07/19/19  Joy, Shawn C, PA-C  traZODone (DESYREL) 50 MG tablet Take 1 tablet (50 mg total) by mouth at bedtime as needed for sleep. 05/26/18   Sanjuana KavaNwoko, Agnes I, NP    Family History No family history on file.  Social History Social History   Tobacco Use  . Smoking status: Current Every Day Smoker    Packs/day: 0.50    Types: Cigarettes  . Smokeless tobacco: Never Used  .  Tobacco comment: 4 ciggs qd  Substance Use Topics  . Alcohol use: Yes  . Drug use: Yes    Comment: former substance abuse     Allergies   Patient has no known allergies.   Review of Systems Review of Systems  Constitutional: Negative for fever.  HENT: Positive for facial swelling.   Eyes: Negative for visual disturbance.  Gastrointestinal: Negative for nausea and vomiting.  Skin: Positive for color change.  Neurological: Negative for dizziness, light-headedness and headaches.     Physical Exam Updated Vital Signs BP 118/88 (BP Location: Right Arm)   Pulse 87   Temp 99.2 F (37.3 C) (Oral)   Resp 18   SpO2 98%   Physical Exam Vitals signs and nursing note reviewed.  Constitutional:      General: He is not in acute distress.    Appearance: He is well-developed. He is not diaphoretic.  HENT:     Head: Normocephalic and atraumatic.     Comments: Approximately 1.5 cm in diameter mass to the right temporal region of the scalp.  Associated tenderness, erythema, and fluctuance, though these findings do not seem to extend beyond the mass itself. Eyes:     Extraocular Movements: Extraocular movements intact.     Conjunctiva/sclera: Conjunctivae normal.     Pupils: Pupils are equal, round, and reactive to light.     Comments: No pain with EOMs.  Neck:     Musculoskeletal: Neck supple.  Cardiovascular:     Rate and Rhythm: Normal rate and regular rhythm.  Pulmonary:     Effort: Pulmonary effort is normal.  Skin:    General: Skin is warm and dry.     Coloration: Skin is not pale.  Neurological:     Mental Status: He is alert.  Psychiatric:        Behavior: Behavior normal.      ED Treatments / Results  Labs (all labs ordered are listed, but only abnormal results are displayed) Labs Reviewed - No data to display  EKG None  Radiology No results found.  Procedures Ultrasound ED Soft Tissue  Date/Time: 07/12/2019 9:35 PM Performed by: Anselm PancoastJoy, Shawn C, PA-C  Authorized by: Anselm PancoastJoy, Shawn C, PA-C   Procedure details:    Indications: localization of abscess     Transverse view:  Visualized   Longitudinal view:  Visualized   Images: not archived (Ultrasound would not store images.)   Location:    Location: head     Side:  Right Findings:     abscess present .Marland Kitchen.Incision and Drainage  Date/Time: 07/12/2019 10:30 PM Performed by: Anselm PancoastJoy, Shawn C, PA-C Authorized by: Anselm PancoastJoy, Shawn C, PA-C   Consent:    Consent obtained:  Verbal   Consent given by:  Patient   Risks discussed:  Bleeding, incomplete drainage, pain and infection Location:    Type:  Abscess (Sebaceous cyst)   Size:  1.5   Location:  Head   Head location:  Face Pre-procedure details:    Skin preparation:  Betadine Anesthesia (see MAR for exact dosages):    Anesthesia method:  Local infiltration   Local anesthetic:  Lidocaine 2% WITH epi Procedure type:    Complexity:  Simple Procedure details:    Incision types:  Stab incision   Incision depth:  Subcutaneous   Scalpel blade:  11   Wound management:  Irrigated with saline   Drainage:  Bloody and purulent   Drainage amount:  Moderate   Wound treatment:  Wound left open   Packing materials:  None Post-procedure details:    Patient tolerance of procedure:  Tolerated well, no immediate complications   (including critical care time)  Medications Ordered in ED Medications  lidocaine-EPINEPHrine (XYLOCAINE W/EPI) 2 %-1:200000 (PF) injection 10 mL (10 mLs Infiltration Given 07/12/19 2151)  sulfamethoxazole-trimethoprim (BACTRIM DS) 800-160 MG per tablet 1 tablet (1 tablet Oral Given 07/12/19 2254)  cephALEXin (KEFLEX) capsule 500 mg (500 mg Oral Given 07/12/19 2254)     Initial Impression / Assessment and Plan / ED Course  I have reviewed the triage vital signs and the nursing notes.  Pertinent labs & imaging results that were available during my care of the patient were reviewed by me and considered in my medical decision making (see  chart for details).        Patient presents with a small mass to the right temporal region of the scalp.  Evidence of fluid collection noted on ultrasound.  I&D performed without immediate complication.  Antibiotic therapy initiated with MRSA coverage added due to patient's history of MRSA infection. Patient also given referral to plastic surgery for any further management, including cyst removal. The patient was given instructions for home care as well as return precautions. Patient voices understanding of these instructions, accepts the plan, and is comfortable with discharge.    Final Clinical Impressions(s) / ED Diagnoses   Final diagnoses:  Sebaceous cyst  Cellulitis of face    ED Discharge Orders         Ordered    cephALEXin (KEFLEX) 500 MG capsule  4 times daily     07/12/19 2249    sulfamethoxazole-trimethoprim (BACTRIM DS) 800-160 MG tablet  2 times daily     07/12/19 2249           Lorayne Bender, PA-C 07/12/19 [place], [place], DO 07/12/19 2333

## 2019-07-12 NOTE — ED Notes (Signed)
I&D Tray and Lidocaine at bedside

## 2019-07-12 NOTE — ED Triage Notes (Signed)
Patient here with small abscess on right side of face, in place for several weeks and has been squeezing same, NAD

## 2019-07-19 ENCOUNTER — Ambulatory Visit: Payer: PRIVATE HEALTH INSURANCE | Admitting: Family Medicine

## 2019-07-19 ENCOUNTER — Other Ambulatory Visit: Payer: Self-pay

## 2019-07-19 DIAGNOSIS — Z0289 Encounter for other administrative examinations: Secondary | ICD-10-CM

## 2019-10-26 ENCOUNTER — Other Ambulatory Visit: Payer: Self-pay

## 2019-10-26 DIAGNOSIS — Z20822 Contact with and (suspected) exposure to covid-19: Secondary | ICD-10-CM

## 2019-10-29 LAB — NOVEL CORONAVIRUS, NAA: SARS-CoV-2, NAA: NOT DETECTED

## 2020-04-29 ENCOUNTER — Emergency Department (HOSPITAL_COMMUNITY)
Admission: EM | Admit: 2020-04-29 | Discharge: 2020-05-07 | Disposition: E | Payer: 59 | Attending: Emergency Medicine | Admitting: Emergency Medicine

## 2020-04-29 DIAGNOSIS — I469 Cardiac arrest, cause unspecified: Secondary | ICD-10-CM | POA: Diagnosis not present

## 2020-04-29 DIAGNOSIS — I468 Cardiac arrest due to other underlying condition: Secondary | ICD-10-CM

## 2020-04-29 DIAGNOSIS — Y929 Unspecified place or not applicable: Secondary | ICD-10-CM | POA: Insufficient documentation

## 2020-04-29 DIAGNOSIS — Y9389 Activity, other specified: Secondary | ICD-10-CM | POA: Insufficient documentation

## 2020-04-29 DIAGNOSIS — Y999 Unspecified external cause status: Secondary | ICD-10-CM | POA: Diagnosis not present

## 2020-04-29 DIAGNOSIS — T07XXXA Unspecified multiple injuries, initial encounter: Secondary | ICD-10-CM | POA: Diagnosis present

## 2020-05-07 DIAGNOSIS — 419620001 Death: Secondary | SNOMED CT | POA: Diagnosis not present
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
## 2020-05-07 NOTE — ED Notes (Signed)
Paused CPR, no pulses, no heart activity w/ ultra sound.  TOD 7am
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
## 2020-05-07 NOTE — Progress Notes (Signed)
Chaplain responded to call and chaplain is available to provide support.

## 2020-05-07 NOTE — Progress Notes (Signed)
The chaplain was present with family as they visited their deceased loved one.  Lavone Neri Chaplain Resident For questions concerning this note please contact me by pager [phone]

## 2020-05-07 DEATH — deceased
